# Patient Record
Sex: Male | Born: 1950 | Race: White | Hispanic: No
Health system: Western US, Academic
[De-identification: ages and names within clinical notes are randomized; demographics above are authoritative.]

## PROBLEM LIST (undated history)

## (undated) DIAGNOSIS — F32A Depression, unspecified: Secondary | ICD-10-CM

## (undated) DIAGNOSIS — I10 Essential (primary) hypertension: Secondary | ICD-10-CM

## (undated) DIAGNOSIS — J449 Chronic obstructive pulmonary disease, unspecified: Secondary | ICD-10-CM

## (undated) DIAGNOSIS — I272 Pulmonary hypertension, unspecified: Secondary | ICD-10-CM

## (undated) DIAGNOSIS — J45909 Unspecified asthma, uncomplicated: Secondary | ICD-10-CM

## (undated) DIAGNOSIS — J309 Allergic rhinitis, unspecified: Secondary | ICD-10-CM

## (undated) DIAGNOSIS — F419 Anxiety disorder, unspecified: Secondary | ICD-10-CM

## (undated) HISTORY — DX: Pulmonary hypertension, unspecified: I27.20

## (undated) HISTORY — DX: Anxiety disorder, unspecified: F41.9

## (undated) HISTORY — DX: Essential (primary) hypertension: I10

## (undated) HISTORY — PX: RT/LT HEART CATHETERS: 93526

## (undated) HISTORY — DX: Allergic rhinitis, unspecified: J30.9

## (undated) HISTORY — DX: Unspecified asthma, uncomplicated: J45.909

## (undated) HISTORY — DX: Depression, unspecified: F32.A

## (undated) HISTORY — DX: Chronic obstructive pulmonary disease, unspecified: J44.9

## (undated) MED ORDER — PANTOPRAZOLE SODIUM 40 MG OR TBEC
DELAYED_RELEASE_TABLET | ORAL | 11 refills | Status: AC
Start: 2019-08-29 — End: ?

## (undated) MED ORDER — FLUCONAZOLE 200 MG OR TABS
ORAL_TABLET | ORAL | 3 refills | Status: AC
Start: 2019-08-29 — End: ?

---

## 2008-10-26 ENCOUNTER — Other Ambulatory Visit (HOSPITAL_COMMUNITY): Payer: Self-pay

## 2008-10-26 LAB — PULMONARY FUNCTION TESTING
DL/VA-%Pred-Pre: 54
DL/VA-Pre: 1.94
DLCO-%Pred-Pre: 42
DLCO-Pre: 9.3
FEV1-%Pred-Pre: 32
FEV1-Pre: 1.09
FEV1/FVC-Pre: 33
FVC-%Pred-Pre: 77
FVC-Pre: 3.29
RV (Pleth)-%Pred-Pre: 156
RV (Pleth)-Pre: 3.53
SVC-%Pred-Pre: 103
SVC-Pre: 4.4
TLC (Pleth)-%Pred-Pre: 123
TLC (Pleth)-Pre: 7.93

## 2012-11-18 ENCOUNTER — Other Ambulatory Visit (HOSPITAL_COMMUNITY): Payer: Self-pay

## 2012-11-18 LAB — PULMONARY FUNCTION TESTING
DL/VA-%Pred-Pre: 45
DL/VA-Pre: 1.81
DLCO-%Pred-Pre: 25
DLCO-Pre: 6.9
FEV1-%Pred-Post: 25
FEV1-%Pred-Pre: 22
FEV1-Post: 0.85
FEV1-Pre: 0.76
FEV1/FVC-Post: 22
FEV1/FVC-Pre: 21
FVC-%Pred-Post: 84
FVC-%Pred-Pre: 79
FVC-Post: 3.8
FVC-Pre: 3.56
RV (Pleth)-%Pred-Pre: 159
RV (Pleth)-Pre: 3.86
SVC-%Pred-Pre: 79
SVC-Pre: 3.56
TLC (Pleth)-%Pred-Pre: 107
TLC (Pleth)-Pre: 7.36

## 2014-07-30 ENCOUNTER — Other Ambulatory Visit (HOSPITAL_COMMUNITY): Payer: Self-pay

## 2014-07-30 LAB — PULMONARY FUNCTION TESTING
DL/VA-%Pred-Pre: 41
DL/VA-Pre: 1.62
DLCO-%Pred-Pre: 26
DLCO-Pre: 7.1
FEV1-%Pred-Pre: 26
FEV1-Pre: 0.88
FEV1/FVC-Pre: 24
FVC-%Pred-Pre: 83
FVC-Pre: 3.71
RV (Pleth)-%Pred-Pre: 153
RV (Pleth)-Pre: 3.79
SVC-%Pred-Pre: 84
SVC-Pre: 3.76
TLC (Pleth)-%Pred-Pre: 109
TLC (Pleth)-Pre: 7.55

## 2014-10-30 ENCOUNTER — Encounter (HOSPITAL_BASED_OUTPATIENT_CLINIC_OR_DEPARTMENT_OTHER): Payer: Self-pay | Admitting: Internal Medicine

## 2015-02-26 ENCOUNTER — Other Ambulatory Visit (HOSPITAL_BASED_OUTPATIENT_CLINIC_OR_DEPARTMENT_OTHER): Payer: Self-pay | Admitting: Internal Medicine

## 2015-02-26 DIAGNOSIS — J439 Emphysema, unspecified: Secondary | ICD-10-CM

## 2015-03-26 ENCOUNTER — Other Ambulatory Visit (HOSPITAL_BASED_OUTPATIENT_CLINIC_OR_DEPARTMENT_OTHER): Payer: Self-pay | Admitting: Internal Medicine

## 2015-03-26 DIAGNOSIS — J439 Emphysema, unspecified: Secondary | ICD-10-CM

## 2015-04-03 ENCOUNTER — Ambulatory Visit (HOSPITAL_BASED_OUTPATIENT_CLINIC_OR_DEPARTMENT_OTHER): Payer: Medicare HMO

## 2015-04-03 ENCOUNTER — Ambulatory Visit (HOSPITAL_BASED_OUTPATIENT_CLINIC_OR_DEPARTMENT_OTHER): Payer: Medicare HMO | Admitting: Counselor

## 2015-04-03 ENCOUNTER — Ambulatory Visit (HOSPITAL_BASED_OUTPATIENT_CLINIC_OR_DEPARTMENT_OTHER): Payer: Medicare HMO | Admitting: Rehabilitative and Restorative Service Providers"

## 2015-04-03 ENCOUNTER — Ambulatory Visit: Payer: Medicare HMO | Attending: Internal Medicine | Admitting: Transplant

## 2015-04-03 VITALS — BP 132/83 | HR 92 | Temp 98.2°F | Ht 68.5 in | Wt 142.0 lb

## 2015-04-03 DIAGNOSIS — R911 Solitary pulmonary nodule: Secondary | ICD-10-CM | POA: Insufficient documentation

## 2015-04-03 DIAGNOSIS — K219 Gastro-esophageal reflux disease without esophagitis: Secondary | ICD-10-CM | POA: Insufficient documentation

## 2015-04-03 DIAGNOSIS — J439 Emphysema, unspecified: Secondary | ICD-10-CM | POA: Insufficient documentation

## 2015-04-03 DIAGNOSIS — J449 Chronic obstructive pulmonary disease, unspecified: Secondary | ICD-10-CM

## 2015-04-03 DIAGNOSIS — F32A Depression, unspecified: Secondary | ICD-10-CM | POA: Insufficient documentation

## 2015-04-03 DIAGNOSIS — Z01818 Encounter for other preprocedural examination: Secondary | ICD-10-CM | POA: Insufficient documentation

## 2015-04-03 DIAGNOSIS — J432 Centrilobular emphysema: Secondary | ICD-10-CM | POA: Insufficient documentation

## 2015-04-03 DIAGNOSIS — I10 Essential (primary) hypertension: Secondary | ICD-10-CM | POA: Insufficient documentation

## 2015-04-03 NOTE — Progress Notes (Addendum)
LUNG TRANSPLANT HISTORY AND PHYSICAL    CC: evaluation for lung transplant    HPI: [name] is a 64 year old male with a history of COPD (emphysema-type), GERD, HTN, Anxiety and recently diagnosed lung nodule who presents for evaluation for lung transplant.    Patient has had a relatively indolent course after being diagnosed with COPD in 24-Dec-2003.  At that time, he was placed on supplemental oxygen and quit smoking, however he started chewing tobacco.  He states that he remained quite active despite the need for supplemental oxygen, and was able to mow his lawn and hunt, which he does with his son and daughter.  Over the last 1-2 years, his functional capacity has diminished, where he is quite limited with his hunting, however he still does all his ADLs, albeit slower than before. With regards to his COPD, he notes one hospitalization roughly 8 years prior (2008-2009), however he has had no hospitalizations since then.  He does have a standing prescription for prednisone when he has flares, which he states he has had to use roughly 3 times per year.    He was noted on 08/13/14 to have a new LUL nodule that was concering for infection vs. Malignancy.  He subsequently had a PET-CT scan which showed FDG avidity in the LUL, and a repeat CT was recommended for interval follow up.  His interval CT in may again showed the LUL lesion with perhaps some thickening and possible air fluid level.    ROS:  Full review of systems was performed and negative except as noted in HPI    PMHx:  1. COPD-with centrilobular emphysema  2. Skin Cancer  3. GERD  4. HTN  5. Anxiety  6. Lung Nodule    Medications:  Current Outpatient Prescriptions   Medication Sig Dispense Refill   . Albuterol Sulfate (PROAIR HFA IN) Inhale 1 puff by mouth every 6 hours as needed.     . Budesonide-Formoterol Fumarate 160-4.5 MCG/ACT Inhalation Aerosol Inhale 2 puffs by mouth 2 times a day.     . Ibuprofen 200 MG Oral Cap Take 200 mg by mouth daily as needed.     .  Lisinopril-Hydrochlorothiazide 20-12.5 MG Oral Tab Take 1 tablet by mouth daily.     . Tiotropium Bromide Monohydrate 1.25 MCG/ACT Inhalation Aero Soln Inhale 1 puff by mouth 2 times a day.     . TraMADol HCl 50 MG Oral Tab Take 100 mg by mouth 2 times a day as needed.     . TraZODone HCl 50 MG Oral Tab Take 100 mg by mouth at bedtime.     . Venlafaxine HCl 100 MG Oral Tab Take 1 tablet by mouth 2 times a day.       No current facility-administered medications for this visit.       Social Hx:  Wife died in early 12/24/2014. 1.5PPD x 25 year smoking history, quit in 12/24/2003.  Chewing tobacco 2005-Dec 24, 2014   Physical Exam:  Physical Exam   Constitutional: He is oriented to person, place, and time and well-developed, well-nourished, and in no distress. No distress.   HENT:   Head: Normocephalic and atraumatic.   Eyes: EOM are normal. No scleral icterus.   Neck: No JVD present.   Pulmonary/Chest: Effort normal.   Musculoskeletal: He exhibits no edema.   Neurological: He is alert and oriented to person, place, and time. No cranial nerve deficit. GCS score is 15.   Skin: Skin is warm  and dry.   Psychiatric: Mood, memory, affect and judgment normal.     Filed Vitals:    04/03/15 0806   BP: 132/83   Pulse: 92   Temp: 98.2 F (36.8 C)   TempSrc: Temporal   Height: 5' 8.5" (1.74 m)   Weight: 141 lb 15.6 oz (64.4 kg)   SpO2: 100%     Body mass index is 21.27 kg/(m^2).      Imaging:  CT Chest: 08/13/14: Notable for masslike consolidation in LUL posterior aspect, indeterminate for chronic fibrosis vs. Neoplasm. LLL calcified granuloma.   CT Chest 12/10/14: New air fluid level in cavitary LLL nodule.  No increase in pleural/parenchymal thickening superiorly.  PET/CT 09/2014: Increased FDG avidity in subpleural left upper lobe nodule.  Calcified granuloma in left lung base.  Indeterminate for malignancy vs infection.    TTE1/4/16: EF 48%, mild global hypokinesis    Studies:   PFTs  07/30/14: FVC 3.71/83 FEV10.88/21 Ratio 0.24 DLCO7.7/28      EGD 12/05/14 for dysphagia: notable for Schatski's ring, dilated on that date  Colo 06/2006: hyperplastic polyps, no adenomatous changes      ASSESSMENT AND PLAN:  [name] is a 64 year old male with a history of COPD (emphysema-type), GERD, HTN, Anxiety and recently diagnosed lung nodule who presents for evaluation for lung transplant.  He meets criteria with regards to severity of illness, however there are a number of factors that must be considered/addressed.    1) LUL nodule concerning for infection vs. Malignancy.  Will f/u chest CT, and will need consideration for biopsy vs. Watchful waiting.  2) Depression and anxiety: especially given his wife's passing earlier this year.  Would consider outpatient psychiatric evaluation.  3) Abstinence from tobacco products: stopped chewing tobacco in April 2016.  Will need to abstain from all tobacco products for at least 6 months prior to listing.  4) Cardiac testing given his mildly depressed EF.  He will require LHC and RHC.    He has adequate support, including his daughter and his niece.          -------------------------------------------------------------------------------------------  Attending: Alphonsa Marsh  Date of service 04/03/15  I saw and evaluated the patient with this fellow.  I repeated the essential components of the history and exam.  I discussed the case with the fellow, and have reviewed the fellow's note. I agree with the findings and plan as documented in the fellow's note.  Key findings based upon my evaluation and discussion:     Meets criteria for transplant consideration given his progressive decrease in QOL, ~3 exacerbations per year, and FEV1 decline to ~20% predicted. Will initiate workup with attention to his LUL nodule with repeat CT today.    Dakota Overall, MD  Attending Physician  Division of Pulmonary and [facility]  -------------------------------------------------------------------------------------------

## 2015-04-03 NOTE — Progress Notes (Signed)
Respiratory Care Services  6 Minute Walk Preliminary Data Sheet         Gender: male   Age: 64 year old          Home Oxygen Therapy: YES   Flow Rate:  3  l/min        At nightYES     Supplemental Oxygen During the Test: NO    Flow Rate:   -   l/min  continuous flow NO    Walking Aid: NO    Description:   -     Position of probe (finger or forehead):  forehead     Pulse oximetry: Baseline:   98%   End of Test:   88%      Heart rate:  Baseline:   75   End of Test:   90    Borg Dyspnea: Baseline:   -   End of Test:   -    Borg Leg Fatigue: Baseline:   -   End of Test:   -    Desaturation to 88%: Time:   1 min. 40 sec.   Distance:   200 feet    Desaturation to 80%:  Time:   -   Distance:   - feet         Stopped / Paused Before the 6 Minutes:YES    Reason:   SpO2 decreased to 88%.    Distance Walked in 6 minutes   200   feet     Comments:     At rest in room air SpO2 = 98%, HR = 75. Patient walked 200 feet in 1 min.  40 sec. SpO2 decreased to 88%. Walk was stopped and supplemental oxygen was started at 2 LPM via a nasal cannula. SpO2 increased to 95 % at the end of the walk test. Patient walked a total of 540 feet in 6 minutes.

## 2015-04-04 ENCOUNTER — Other Ambulatory Visit (HOSPITAL_COMMUNITY): Payer: Self-pay

## 2015-04-04 ENCOUNTER — Other Ambulatory Visit (HOSPITAL_BASED_OUTPATIENT_CLINIC_OR_DEPARTMENT_OTHER): Payer: Self-pay | Admitting: Internal Medicine

## 2015-04-04 ENCOUNTER — Ambulatory Visit: Payer: Medicare HMO | Attending: Cardiovascular Disease

## 2015-04-04 ENCOUNTER — Ambulatory Visit (HOSPITAL_BASED_OUTPATIENT_CLINIC_OR_DEPARTMENT_OTHER): Payer: Medicare HMO | Admitting: Registered"

## 2015-04-04 VITALS — Ht 68.62 in | Wt 139.3 lb

## 2015-04-04 DIAGNOSIS — J439 Emphysema, unspecified: Secondary | ICD-10-CM

## 2015-04-04 DIAGNOSIS — J432 Centrilobular emphysema: Secondary | ICD-10-CM

## 2015-04-04 DIAGNOSIS — Z01818 Encounter for other preprocedural examination: Secondary | ICD-10-CM

## 2015-04-04 LAB — BLOOD GAS, VENOUS (NO ELECTROLYTES)
Base Excess, Blood, VEN: 7.9 meq/L — ABNORMAL HIGH (ref 0.0–3.0)
Bicarbonate, VEN: 35 meq/L — ABNORMAL HIGH (ref 23–27)
O2 Saturation, VEN: 31 % — ABNORMAL LOW (ref 70–75)
pCO2, VEN: 61 mmHg — ABNORMAL HIGH (ref 42–50)
pH, VEN: 7.36 (ref 7.32–7.40)
pO2, VEN: 21 mmHg — ABNORMAL LOW (ref 35–40)

## 2015-04-04 LAB — URINALYSIS COMPLETE, URN
Bacteria, URN: NONE SEEN
Bilirubin (Qual), URN: NEGATIVE
Epith Cells_Renal/Trans,URN: NEGATIVE /HPF
Epith Cells_Squamous, URN: NEGATIVE /LPF
Glucose Qual, URN: NEGATIVE mg/dL
Ketones, URN: NEGATIVE mg/dL
Leukocyte Esterase, URN: NEGATIVE
Nitrite, URN: NEGATIVE
Occult Blood, URN: NEGATIVE
Protein (Alb Semiquant), URN: NEGATIVE mg/dL
RBC, URN: NEGATIVE /HPF
Specific Gravity, URN: 1.01 g/mL (ref 1.002–1.027)
WBC, URN: NEGATIVE /HPF
pH, URN: 7 (ref 5.0–8.0)

## 2015-04-04 LAB — HLA TESTING (SENDOUT)

## 2015-04-04 NOTE — Progress Notes (Signed)
Date: 04/04/2015  Referred by: Dr. Janalyn Shy  Time spent with patient: 60 minutes  Reason for appointment: Lung Transplant Nutrition Assessment and Education  Interpreter used:  No    PI: Dakota Marsh is a 64 year old male with a history of COPD, emphysema-type seen in clinic today for transplant work up    Anthropometric Data:  Current wt: 63.2 kg 04/04/2015    Height: 174.3 cm-measured by RD 04/04/2015  Body mass index is 20.8 kg/(m^2).      Minimum weight for BMI of 18 kg/m2: 54.7 kg (120 lbs)      Usual body weight: 66.8 kg    % UBW: 95 %  Ideal body weight: 66-72 kg    Weight for calculations: 65 kg (adjusted)  Goal target weights: BMI of 22 kg/m2: 66.7 kg (147 lbs) BMI of 23 kg/m2: 69.7 kg (153 lbs)    Patient Interview/Subjective:    Weight history: Stable at 147 lbs until he took on roll of wife's caregiver 1.5 years ago.  Weight gradually declined and reached a low of 135 lbs while he was still using chewing tobacco.  Has gained slightly since stopping chewing tobacco  Anorexia: Improved appetite   Taste Changes: No   Early satiety/Dyspnea during/after eating: Yes, can't overeat though this issue has also improved since he quit chewing  GI symptoms: Constipation though improved now that he's eating balanced meals  Food allergies: None  Use of calorie/protein supplements: None  Chewing/Swallowing/Dental issues: No chewing or dental issues identified.  Reports upper GI and possible dilation (?) last April  Current diet: Regular, no dietary restrictions  Number of meals/snacks/day: 2 meals with snacks between.  Cooks for himself  Diet history:   Coffee in the AM  Sandwich (lunch meat, mayo, 2 slices of bread), potato chips, coffee for lunch  Hamb[employer] Hel[employer] or spaghetti with sauce are typical entrees, vegetables, salad, slice of bread for evening meal  Snacks consist of potato chips, cheese with crackers, salami or popcorn  Fluid intake: Whole milk, 1/2 caffenated, "weak" coffee ~50 ounces/day.  Used to drink 3  pots of regular coffee daily.  No water, no juice, no soda  Alcohol intake: None  Smoking history: Quit in 2005 and stopped chewing tobacco  Recreational drug use: None  Social history: Widower and lives alone  Activities of daily living: Housework, cooking, yard work/gardening  Recreational Activity: Does outdoor activities such as hunting but has significantly modified pace 2/2 decreased functional status and changes in respiratory status  Participation in Pulmonary Rehab: No lives 40 minutes away from town in [place]  Vitamin, mineral and herbal supplements use: Has an MVI but doesn't take  Glucose monitoring: NA   Estimation of Hgb A1C: NA    Clinical Data:  Diagnosis and medical history:  COPD-with centrilobular emphysema  2. Skin Cancer  3. GERD  4. HTN  5. Anxiety  6. Lung Nodule  Current Outpatient Prescriptions   Medication Sig Dispense Refill   . Albuterol Sulfate (PROAIR HFA IN) Inhale 1 puff by mouth every 6 hours as needed.     . Budesonide-Formoterol Fumarate 160-4.5 MCG/ACT Inhalation Aerosol Inhale 2 puffs by mouth 2 times a day.     . Ibuprofen 200 MG Oral Cap Take 200 mg by mouth daily as needed.     . Lisinopril-Hydrochlorothiazide 20-12.5 MG Oral Tab Take 1 tablet by mouth daily.     . Tiotropium Bromide Monohydrate 1.25 MCG/ACT Inhalation Aero Soln Inhale 1 puff by mouth 2  times a day.     . TraMADol HCl 50 MG Oral Tab Take 100 mg by mouth 2 times a day as needed.     . TraZODone HCl 50 MG Oral Tab Take 100 mg by mouth at bedtime.     . Venlafaxine HCl 100 MG Oral Tab Take 1 tablet by mouth 2 times a day.       No current facility-administered medications for this visit.   Labs: Na 139 K+ 3.7 Creat .72 GFR > 60 Glu 96 Ca 9.7 Mg 2.1 Phos 2.2 A1C 5.5 Alb 4.4 Hct 42 Iron 136 MCHC 31.8  T Chol 231 TG 80 LDL 132 HDL 83 Non HDL 148 Vitamin D 30.6  04/04/2015    Nutrient Requirements: 2400-2600 kcal (for wt gain), 80-85 gm protein (60-65 gm coming from HBV sources), 1000 mg calcium, 1000 IU vitamin D,  2000 ml fluid (30 ml/kg)    Nutrition Assessment:  Evaluation of intake: Roughly estimate pt consuming ~ 1800-2000 kcal daily with ~40-50 gm of high quality protein daily meeting 75% of calorie needs and ~65-70 % of protein needs for weight gain of lean body mass/anabolism.  Dietary intake of calcium ~ 400-650 mg/day based on history provided  Ability to meet nutrient requirements: Yes with dietary adjustments   Barriers: Early satiety, need for preparing meals/snacks on his own and cooking for one  Evaluation of nutrition status: Moderate nutritional risk 2/2 disease state and co morbidities.  Remains functionally stable despite increased oxygen needs and emotional toll of the loss of his wife after being her primary caregiver.  Visually appears mildly depleted in lean body mass and is currently ~ 8 pounds below his usual/target weight.  Labs reflect slightly depleted phosphorus and mild hyperlipidemia though increased HDL contributing to T Chol elevation.  MCHC also slightly below normal though RBC and Hct on target  Demonstrated understanding, willingness and ability to improve nutritional status: Yes    Education Provided:  Meeting Nutrient Requirements-focus on calories, protein, calcium and vitamin D  Use of Oral Supplements-samples of Boost VHC provided  Tips for Weight Gain  Use of vitamin and mineral supplements to meet daily needs  Written materials resources provided: Yes      Demonstrated understanding of education: Yes    Barriers: None  Education was provided taking into consideration the patient's learning abilities, readiness to learn and cultural beliefs    Goals of nutrition therapy:  Anabolism; Adequate intake of all nutrients, including calories, protein, calcium, vitamin D and phosphorus    Plan:  Review of nutritional status with transplant team at selection conference when appropriate   Needs follow up: Yes    RD contact information provided to patient for follow up     Asencion Noble,  [place]  Registered Dietitian  [phone]

## 2015-04-05 ENCOUNTER — Encounter (HOSPITAL_BASED_OUTPATIENT_CLINIC_OR_DEPARTMENT_OTHER): Payer: Self-pay | Admitting: Transplant

## 2015-04-05 LAB — XX DO NOT USE XX PRE-LUNG HEME/COAG PANEL
D_Dimer, Quant: <0.27 ug{FEU}/mL (ref 0.00–0.59)
Erythrocyte Sedimentation Rate: 14 mm/h (ref 0–15)
Fibrinogen: 437 mg/dL (ref 150–450)
Hematocrit: 42 % (ref 38–50)
Hemoglobin A1C: 5.5 % (ref 4.0–6.0)
Hemoglobin: 13.4 g/dL (ref 13.0–18.0)
MCH: 29 pg (ref 27.3–33.6)
MCHC: 31.8 g/dL — ABNORMAL LOW (ref 32.2–36.5)
MCV: 91 fL (ref 81–98)
Partial Thromboplastin Time: 25 s (ref 22–35)
Platelet Count: 271 10*3/uL (ref 150–400)
Prothrombin INR: 1 (ref 0.8–1.3)
Prothrombin Time Patient: 12.8 s (ref 10.7–15.6)
RBC: 4.62 10*6/uL (ref 4.40–5.60)
RDW-CV: 12.5 % (ref 11.6–14.4)
Thrombin Time: 16 s (ref 16–25)
WBC: 5.81 10*3/uL (ref 4.3–10.0)

## 2015-04-05 NOTE — Addendum Note (Signed)
Addended byAlphonsa Overall on: 04/05/2015 05:25 PM     Modules accepted: Level of Service

## 2015-04-07 LAB — NICOTINE AND METABOLITES
Cotinine (Sendout): <2 ng/mL (ref <2.0)
Nicotine (Sendout): <2 ng/mL (ref <2.0)

## 2015-04-08 LAB — XX DO NOT USE PRE-LUNG EXTENDED PANEL
ALT (GPT): 14 U/L (ref 10–48)
AST (GOT): 16 U/L (ref 9–38)
Albumin: 4.4 g/dL (ref 3.5–5.2)
Alkaline Phosphatase (Total): 61 U/L (ref 37–159)
Amylase Total: 57 U/L (ref 27–106)
Ana Interpretation Comment 1: NEGATIVE
Ana Screen By Multiplex: NEGATIVE
Anion Gap: 8 (ref 4–12)
Antibodies to Nuclear Ags by IF (ANA): NEGATIVE
Bilirubin (Total): 0.4 mg/dL (ref 0.2–1.3)
Bone Alkaline Phosphatase: 12 ug/L (ref 0–20)
Calcium: 9.7 mg/dL (ref 8.9–10.2)
Carbon Dioxide, Total: 31 meq/L (ref 22–32)
Chloride: 100 meq/L (ref 98–108)
Cholesterol (LDL): 132 mg/dL — ABNORMAL HIGH (ref <130)
Cholesterol/HDL Ratio: 2.8
Creatinine: 0.72 mg/dL (ref 0.51–1.18)
Ferritin: 183 ng/mL (ref 20–230)
GFR, Calc, African American: >60 mL/min (ref >59)
GFR, Calc, European American: >60 mL/min (ref >59)
Glucose: 96 mg/dL (ref 62–125)
HDL Cholesterol: 83 mg/dL (ref >39)
Immunoglobulin A: 287 mg/dL (ref 84–499)
Immunoglobulin E, Total: 84 [IU]/mL (ref 0–214)
Immunoglobulin G: 912 mg/dL (ref 610–1616)
Immunoglobulin M: 26 mg/dL — ABNORMAL LOW (ref 40–350)
Iron, SRM: 136 ug/dL (ref 31–171)
Lactate Dehydrogenase: 178 U/L (ref <210)
Magnesium: 2.1 mg/dL (ref 1.8–2.4)
Non-HDL Cholesterol: 148 mg/dL (ref 0–159)
Parathyroid Hormone: 45 pg/mL (ref 12–88)
Phosphate: 2.2 mg/dL — ABNORMAL LOW (ref 2.5–4.5)
Potassium: 3.7 meq/L (ref 3.6–5.2)
Protein (Total): 7.4 g/dL (ref 6.0–8.2)
Rheumatoid Factor: <10 [IU]/mL (ref <15)
Sodium: 139 meq/L (ref 135–145)
Thyroid Stimulating Hormone: 1.086 u[IU]/mL (ref 0.400–5.000)
Thyroxine (T4): 9 ug/dL (ref 4.8–10.8)
Total Cholesterol: 231 mg/dL — ABNORMAL HIGH (ref <200)
Total Iron Binding Capacity: 414 ug/dL (ref 250–460)
Transferrin Saturation: 33 % (ref 15–50)
Transferrin: 296 mg/dL (ref 180–329)
Triglyceride: 80 mg/dL (ref <150)
Triiodothyronine (T3): 150 ng/dL (ref 73–178)
Urea Nitrogen: 15 mg/dL (ref 8–21)
Uric Acid: 5.2 mg/dL (ref 3.9–7.6)
Vit D (25_Hydroxy) Total: 30.6 ng/mL (ref 20.1–50.0)
Vitamin D2 (25_Hydroxy): <1 ng/mL
Vitamin D3 (25_Hydroxy): 30.6 ng/mL

## 2015-04-08 LAB — BWNW HLA CLASS I, II MOL TYPE

## 2015-04-08 LAB — ABO, RH (SENDOUT)

## 2015-04-08 LAB — HLA AB SPECIFICITY (SENDOUT)

## 2015-04-10 LAB — LUNG TRX WORKUP SEROLOGY PANEL
Anti Toxoplasma, IgG Interp: NONREACTIVE
Anti Toxoplasma, IgG Result: <3.2 [IU]/mL (ref 0.0–7.4)
Anti Toxoplasma, IgM Interp: NONREACTIVE
CMV Immune Status Result: POSITIVE
EBV IgG Antibody: POSITIVE
EBV IgM Antibody: NEGATIVE
EBV Nuclear Antibody: POSITIVE
HIV Antigen and Antibody Interpretation: NONREACTIVE
HIV Antigen and Antibody Result: NONREACTIVE
HSV1 WB Result: POSITIVE
HSV2 WB Result: NEGATIVE
Hepatitis A IgG Antibody: NONREACTIVE
Hepatitis A IgM antibody: NONREACTIVE
Hepatitis B Core Ab: NONREACTIVE
Hepatitis B Surf Antibody Intl Units: <8 [IU]
Hepatitis B Surface Ab: NONREACTIVE
Hepatitis B Surface Antigen w/Reflex: NONREACTIVE
Hepatitis C Antibody w/Rflx PCR: NONREACTIVE
Mumps Immune Status Result: POSITIVE
Quantiferon TB Ag Result: 0.133 IU gamma IF/mL
Quantiferon TB MIT Result: >10 IU gamma IF/mL
Quantiferon TB Nil Result: 0.133 IU gamma IF/mL
Rubella Antibody Level: >20 [IU]
Rubella Immune Status Result: POSITIVE
Rubeola Immune Status Result: POSITIVE
Syphilis Igg Ab Result: NEGATIVE
Syphilis Screening Status: NEGATIVE
Varicella Zoster Immune Status Result: >1:8 {titer}

## 2015-05-05 NOTE — Progress Notes (Signed)
PSYCHOSOCIAL ASSESSMENT: LUNG TRANSPLANT  IDENTIFYING DATA:  Dakota Marsh is a 64 year-old male diagnosed with a COPD who met with social work as part of the evaluation process for possible lung transplantation. The patient was accompanied by his daughter, Earley Brooke, and his niece, Doran Stabler.     SOCIAL HISTORY:  Mr. Erdahl was born and raised [place]. He lived and worked in [place] for a period of time before returning to [place]. He currently lives in his own home in [place], [place], a small community 40 miles from [place].    The patient is a widower. He has four adult children. Tonya lives in [place], [place].  He has a son in [place] and another son in [place].  He did not provide the name of his other son who is currently in prison. The patient has two adult grandchildren.   Mr. Housman has identified his daughter and niece as his primary care providers. Both are available to provide support as long as required.    Mr. Wherley understands he is required to live in the [place] area for a minimum of 3-6 months post-transplant. At this point his plan is to stay in his niece's home in [place], [place].    The patient will need to have a flight plan in place prior to being listed for transplant.  He will inquire with his insurance to see if air transport is part of his benefits.     EDUCATION/EMPLOYMENT/INCOME:  Mr. Dowding worked in [employer] for most of his adult life. He retired due to his disability in 2005. He has [employer].   Mr. Zollner has not completed a Durable Power of Attorney for [place] or [employer] Direct[employer] and was given appropriate forms to complete and return to [facility] social worker as soon as possible.      EXPECTATIONS OF TRANSPLANT:  Mr. Ulbricht expresses that he is very motivated to pursue transplant. He is eager to return to the activities he enjoys especially fishing and hunting.  He is quite limited due to his oxygen requirements but is able  to complete ADL's independently.        Mr. Fuelling has a good understanding of the medication compliance issue, and a willingness to adhere to a post-transplant medical follow-up schedule of numerous appointments particularly in the first months following the surgery.    PSYCHOSOCIAL ASSESSMENT:  Mr. Torpey presented as a very pleasant, friendly man who appeared to be his stated age. His affect was unremarkable and he appeared alert and oriented throughout the interview.      Mr. Staiger is prescribed Effexor for anxiety. He is not enrolled in outpatient mental health therapy and has never been hospitalized for psychiatric reasons.  Mr. Cimo smoked 1.5 PPD for 25 years. He stopped smoking in 2005 then began chewing tobacco. He stopped in April of this year.  He denies current use of other nicotine products such as chewing tobacco or Nicorette gum.  The patient rarely consumes alcohol. He has never used recreational drugs.   IMPRESSIONS AND RECOMMENDATIONS:  Mr. Coluccio presents as a pleasant, cooperative individual with a reasonable understanding the risks and benefits associated with transplantation. He seems to have adequate caregiver pursue listing.  He is encouraged to consider fund raising as a way to offset the costs associated with transplant. He was provided contact information for [employer], the organization which assists transplant patients with fund raising endeavors.     Assuming he is able to complete  a care and housing plan and arrange for air transport, Social Work recommends that Mr. Lowenthal be listed for lung transplant.

## 2015-05-23 ENCOUNTER — Other Ambulatory Visit (HOSPITAL_BASED_OUTPATIENT_CLINIC_OR_DEPARTMENT_OTHER): Payer: Self-pay | Admitting: Internal Medicine

## 2015-05-23 DIAGNOSIS — J439 Emphysema, unspecified: Secondary | ICD-10-CM

## 2015-06-07 ENCOUNTER — Other Ambulatory Visit: Payer: Self-pay | Admitting: Internal Medicine

## 2015-06-07 ENCOUNTER — Other Ambulatory Visit (HOSPITAL_BASED_OUTPATIENT_CLINIC_OR_DEPARTMENT_OTHER): Payer: Self-pay | Admitting: Internal Medicine

## 2015-06-07 ENCOUNTER — Telehealth (HOSPITAL_BASED_OUTPATIENT_CLINIC_OR_DEPARTMENT_OTHER): Payer: Self-pay | Admitting: Surgery

## 2015-06-07 DIAGNOSIS — J439 Emphysema, unspecified: Secondary | ICD-10-CM

## 2015-06-07 NOTE — Telephone Encounter (Signed)
Orders

## 2015-06-20 ENCOUNTER — Other Ambulatory Visit (HOSPITAL_BASED_OUTPATIENT_CLINIC_OR_DEPARTMENT_OTHER): Payer: Self-pay | Admitting: Transplant

## 2015-06-20 ENCOUNTER — Other Ambulatory Visit (HOSPITAL_BASED_OUTPATIENT_CLINIC_OR_DEPARTMENT_OTHER): Payer: Self-pay | Admitting: Internal Medicine

## 2015-06-20 DIAGNOSIS — J439 Emphysema, unspecified: Secondary | ICD-10-CM

## 2015-06-20 DIAGNOSIS — J449 Chronic obstructive pulmonary disease, unspecified: Secondary | ICD-10-CM

## 2015-06-25 ENCOUNTER — Ambulatory Visit (HOSPITAL_BASED_OUTPATIENT_CLINIC_OR_DEPARTMENT_OTHER): Payer: Medicare HMO | Admitting: Registered"

## 2015-06-25 ENCOUNTER — Other Ambulatory Visit (HOSPITAL_BASED_OUTPATIENT_CLINIC_OR_DEPARTMENT_OTHER): Payer: Self-pay | Admitting: Internal Medicine

## 2015-06-25 ENCOUNTER — Ambulatory Visit: Payer: Medicare HMO | Attending: Internal Medicine | Admitting: Surgery

## 2015-06-25 ENCOUNTER — Ambulatory Visit (HOSPITAL_BASED_OUTPATIENT_CLINIC_OR_DEPARTMENT_OTHER): Payer: Medicare HMO

## 2015-06-25 VITALS — Ht 68.62 in | Wt 140.7 lb

## 2015-06-25 DIAGNOSIS — J432 Centrilobular emphysema: Secondary | ICD-10-CM | POA: Insufficient documentation

## 2015-06-25 DIAGNOSIS — J439 Emphysema, unspecified: Secondary | ICD-10-CM | POA: Insufficient documentation

## 2015-06-25 DIAGNOSIS — Z23 Encounter for immunization: Secondary | ICD-10-CM | POA: Insufficient documentation

## 2015-06-25 DIAGNOSIS — J449 Chronic obstructive pulmonary disease, unspecified: Secondary | ICD-10-CM

## 2015-06-25 NOTE — Progress Notes (Signed)
Vaccine Screening Questions: [facility]        1. I feel sick today.   NO    1. There is no evidence that acute illness reduces vaccine efficacy or increases vaccine adverse events. However, with moderate or severe illness, all vaccines should be delayed until the illness has improved. Mild illness (e.g., upperrespiratory infection or diarrhea) are NOT contraindications to vaccination. Do not withhold vaccination if a person is taking antibiotic.    2. Are you allergic to eggs, baker's yeast, gelatin, streptomycin, neomycin, formaldehyde,merthiolate / thimerosal? (Please circle all that apply)  NO    2. Please see Vaccine Excipient Table below at end of form.**    3. Are you currently taking "blood thinning medications" such as Warfarin (Coumadin), Enoxaparin (Lovenox, Dalteparin(Fragmin), Fondiparinux(Arixtra), Aspirin, Clopidogrel(Plavix), Prasugrel(Effient), Dipyridamol(Persantine), Ticagrelor(Brilinta), Cilostazol(Pletal), Aggrenox, Rivaroxaban(Xarelto), Dabigatran(Xarelto)  NO    3.  Patients who are currently receiving these medications need to advise their providers so that we may take specific precautions when giving intramuscular vaccines. Administer using a 23-gauge (or smaller) needle and apply pressure to  the vaccine site for 2 minutes after administration. DO NOT RUB the vaccine site. Patients should be counseled on monitoring for signs and symptoms of hematoma formation at the site of the injection.    4. Do you have allergies to medications, food, or any vaccine?   If yes, please list:@ALLERGIES@  NO    4. History of anaphylactic reaction such as hives, wheezing or difficulty breathing, or circulatory collapse or shock (not fainting) from a previous dose of vaccine or vaccine component is a contraindication for further doses. For example, if a person  experiences anaphylaxis after eating eggs, do not administer influenza vaccine, or if a person has anaphylaxis after eating  gelatin, do not administer MMR or varicella vaccine. Local reactions (e.g., a red eye following instillation of ophthalmic solution) are not contraindications. Please see Vaccine Excipient Table enclosed on order form.     5. Have you ever had a severe reaction to any vaccine? If yes, please describe:  NO    5.  History of anaphylactic reaction to a previous dose of vaccine or vaccine component is a contraindication for subsequent  doses. Under normal circumstances, vaccines are deferred when a precaution is present. However, situations may arise  when the benefit outweighs the risk (e.g., community measles outbreak).    6. Are you currently undergoing radiation treatments?  NO    6.  If patient is undergoing radiation therapy, inactivated vaccine is no problem. For live virus vaccines contact patient's PCP prior to administration.    7. Have you had Immune Globulin (Gamma Globulin) or received blood or blood products in the past seven months?  NO    7. Live virus vaccines may need to be deferred, depending on several variables. Please see the ACIP Statement "General Recommendations on Immunization" on page 1 of the PINK BOOK and APPENDIX A for suggested intervals between administration of blood products and live virus vaccines.    8. Have you ever had Guillian-Barre Syndrome, a condition that causes paralysis?  NO    8. If the patient has had this condition, hold the vaccination and call the MD.    9.  Are you pregnant or planning a pregnancy in the next three months?  NO    9. Live virus vaccines are contraindicated prior to and during pregnancy due to the possible risk of virus transmission to the fetus. Instruct patients who are sexually active   in their childbearing years to practice careful contraception for one month  after receiving the MMR, rubella, varicella, or intranasal influenza vaccination.    10. Have you received any vaccinations in the past 4 weeks?  Yes: pneumonia, zoster    10. You may either  administer two live vaccines on the same day OR separate them by 28 days. Live vaccines must replicate in order for the immune system to mount a response. Separating live vaccine administrations allows for less interference of  viral replication. On the other hand, inactivated vaccines may be given together or at any spacing interval.    11. Are you allergic to latex products? (If yes, please describe reaction):  NO    11. If the patient has a true latex allergy, call the inpatient pharmacy ([phone]) and they will provide a latex-free immunization.          Adapted from "Understanding the Screening Questionnaire for Adult Immunization", Immunization [employer] Coalition, www.immunize.org.   Updated June 2014    **VACCINE EXCIPIENT AND MEDIA SUMMARY  Excipient Use Vaccine    Egg protein: Influenza (all brands)  Formaldehyde, formalin:  DTaP (all brands), Td (all brands), Hepatitis A (Havrix, Vaqta), Hepatitis A-hepatitis B (Twinrix), Influenza (Fluogen, FluShield, Fluzone)  Gelatin: Influenza (Fluzone), Measles (Attenuvax), Mumps (Mumpsvax),  Rubella (Meruvax II), MMR (MMRII), Typhoid oral (Vivotif), Varicella (Varivax)  Neomycin:  Measles (Attenuvax), Mumps (Mumpsvax), Rubella (Meruvax II),  MMR (MMR-II), Rabies (Imovax), Influenza (Fluvirin)  Phenol: Pneumococcal polysaccharide (Pneumovax 23)  Streptomycin: Influenza (Fluogen)  Thimerosal: Influenza (all brands except the intranasal vaccine), Pneumococcal  polysaccharide (Pneu-Immune 23), Meningococcal (Menomune)  Yeast: Hepatitis B (Engerix-B, Recombivax-HB)    Adapted from "Vaccine Excipient & Media Summary", ImmunoFacts, Facts and Comparisons,  February 2001 and Offit PA, Jew RK. Addressing parents' concerns: do vaccines contain harmful  preservatives, adjuvants, additives, or residuals? Pediatrics.2003;112(6);1394-7.  Updated June 2014

## 2015-06-25 NOTE — Progress Notes (Signed)
[facility] Manometry Procedure  Patient in clinic today for a manometry procedure. Patient identification verified.  Procedure reviewed and consent signed. Patient reports that they have been fasting for six hours.      Referred by: Dr. Dian Situ  Chief complaint: Shortness of breath, cough.  COPD    Patient intubated:  [x]  Right nares  []  Left nares  []  orally  []  other     Tolerated procedure: _  [x] Well with minimal difficulty.    [x] O2 sat 91-97% throughout procedure.    [x] O2 at 4L via nasal prongs during procedure.   [x] 2% viscous lidocaine applied to both nare and back of throat prior to start of procedure. Patient states no allergy to lidocaine.  [x] Anticoagulant therapy: none    D/C Instructions reviewed:  [x]  If you can swallow comfortably, you may resume your usual diet.   [x]  You may have a sore throat for a couple of hours  [x]  We would like you to notify the clinic if you develop any of the following:        a. Temperature over 99.5 F or chills        b.  Unusual chest pain or abdominal pain        c.  Any unusual difficulty swallowing or moving your neck        d.  Any intestinal bleeding symptoms (black or red stools)    Office Contact Information and after hour information provided.    Other: Placement pH wire following manometry procedure.   Patient to follow-up with referring physician, Dr. Dian Situ.    Rhoderick Moody, RN

## 2015-06-25 NOTE — Progress Notes (Signed)
24 hr pH Catheter placement     Patient in clinic  today for a 24hr pH catheter placement.  Patient identification verified.  Patient has been fasting for 6 hours.  Referred by: Dr. Dian Situ  Chief complaint: Shortness of breath, cough.  COPD  Allergy to tape: none     Patient reports that the  following medications have been stopped:  []  PPI's for   days  [] H2 blockers for   hours  [] Antacids since  midnight  [x]  Not on any  medication       Type of Catheter  placed:  [x] PHN 10  [] ZAI-BL-56  [] ZPI-BL-54  []  ZAN-BS-01      Catheter placed into right Nares at  41  cm.  Secured to right side of face with tegaderm and catheter run over the top of the right ear and secured to right neck.     Tolerated procedure:   [x] Catheter passed smoothly, no issues  [x] Placement pH wire following manometry procedure and location of proximal LES.  [x] Recorder PM# [id number],  PH wire lot# [id number], expiration 05/10/2016, card# 2       Instruction  reviewed:  [x] Avoid getting the unit  wet   [x] Do not remove the catheter or disconnect from the unit  [x] Do not take antacids (Tums, Mylanta, or  prescription antacids)  [x] Do not eat saltine  crackers  [x] Avoid eating the following: peanut butter, sticky rice, hot cheese, pie crust, syrup, honey, taffy, macaroni and cheese, and carmel..  [x]  If you drink carbonated beverages or citrus  drinks, wine, gatorade, or crystal lite to drink within a 10 minute time period  [x] Diary reviewed in regards to how to fill it  in.  [x] Eat 3 meals today  [x]  Press the supine button when you are planning  to sleep at night or for a nap     Follow-up:  Patient scheduled to return tomorrow for catheter removal.   Patient to follow-up with referring physician, Dr. Dian Situ.       Rhoderick Moody, RN

## 2015-06-25 NOTE — Progress Notes (Signed)
Date: 06/25/2015  Referred by: Dr. Waymond Cera  Time spent with patient: 45 minutes  Reason for appointment: Lung Transplant Nutrition Assessment and Education-Reevaluation  Interpreter used:  No    PI:  Dakota Marsh is a 64 year old male with a history of COPD, emphysema-type seen in clinic today for transplant work up    Anthropometric Data:  Current wt: 63.8 kg 06/25/2015    Height: 174.3 cm-measured by RD 04/04/2015  Body mass index is 21 kg/(m^2).      Minimum weight for BMI of 18 kg/m2: 54.7 kg    Goal target weights: BMI of 22 kg/m2: 66.7 kg    BMI of 23 kg/m2: 69.7 kg  Weight history: 63.2 kg 04/04/2015    Usual body weight: 66.8 kg      Ideal body weight: 66-72 kg    Weight for calculations: 65 kg   Start boost plus  receiving 600 mg of calcium     Patient Interview/Subjective:    Weight history: Stable at home  Anorexia-No  Appetite is improving and is now becoming hungry because he's eating more snacks during the day    Early satiety: Yes    Dyspnea after eating: Yes-occurs after eating  GI symptoms: Constipation on occasion  Food allergies: None  Use of calorie/protein supplements: Yes liked samples provided at last clinic visit but hasn't purchased.  Wiling to start drinking for a breakfast alternative and before bed  Chewing/Swallowing/Dental issues: No  Current diet: Regular  Number of meals and/or snacks/day: 2 meals, 1-2 snacks  Diet History:  Drinks black coffee for breakfast only  Whole lunch meat and cheese with mayo sandwich for lunch with 16 ounces of whole milk  Snacks in the afternoon consist of cheese with crackers, nuts, potato chips and home made jerkey  Cooked protein (sausage, steak, burgers, chicken) with vegetables for dinner  Middle of the night sandwich when he's hungry (lunch meat/cheese)  Fluid intake: Coffee, water, 16 ounces of whole milk   Energy level: Good-feels well and highly active on his property in [place] of daily living: Continues to live independently caring for  himself, his home and his property  Recreational Activity: Has been hunting this fall  Participation in Pulmonary Rehab: Has not attended as he lives 40 miles away and he's functionally stable at home.  May consider if he moves to [facility] with daughter or will use her exercise equipment at her home  Vitamin, mineral and herbal supplements use: [employer] vitamins-gummy, Vitamin D3-?, vitamin B complex.  No calcium    Clinical Data:  Diagnosis and medical history:  COPD-with centrilobular emphysema  2. Skin Cancer  3. GERD  4. HTN  5. Anxiety  6. Lung Nodule  Current Outpatient Prescriptions   Medication Sig Dispense Refill   . Albuterol Sulfate (PROAIR HFA IN) Inhale 1 puff by mouth every 6 hours as needed.     . Budesonide-Formoterol Fumarate 160-4.5 MCG/ACT Inhalation Aerosol Inhale 2 puffs by mouth 2 times a day.     . Ibuprofen 200 MG Oral Cap Take 200 mg by mouth daily as needed.     . Lisinopril-Hydrochlorothiazide 20-12.5 MG Oral Tab Take 1 tablet by mouth daily.     . Tiotropium Bromide Monohydrate 1.25 MCG/ACT Inhalation Aero Soln Inhale 1 puff by mouth 2 times a day.     . TraMADol HCl 50 MG Oral Tab Take 100 mg by mouth 2 times a day as needed.     Marland Kitchen  TraZODone HCl 50 MG Oral Tab Take 100 mg by mouth at bedtime.     . Venlafaxine HCl 100 MG Oral Tab Take 1 tablet by mouth 2 times a day.       No current facility-administered medications for this visit.   Labs: Not checked  Nutrient Requirements: 2500+-kcal, 80-85 gm protein (60-65 gm coming from HBV sources), 1000 mg calcium, 1000 IU vitamin D    Nutrition Assessment:  Evaluation of intake: Meets baseline needs for anabolism though not weight gain and daily calcium intake 600 mg meeting 60 % of target.  Adding 1-2 Boost + day will provide additional 360 to 720 kcal with 300-600 mg additional calcium  Ability to meet nutrient requirements: Yes with dietary adjustments such as adding Boost + for breakfast  Evaluation of nutrition status: Low to  moderate nutritional risk with great functional status.  Appears improved since last seen with stable weight though will benefit from additional nutrient intake to promote weight gain of additional muscle mass.  Labs not checked though vitamin D was wnl with last blood draw  Demonstrated understanding, willingness and ability to improve nutritional status: Yes    Education Provided:  Meeting Nutrient Requirements-calcium.  Continue 16 ounces of milk and start Boost + 1-2/day  Use of Oral Supplements-Start Boost + 1-2/day, every AM for breakfast and HS as desired  Alternate lower sodium, higher calorie/protein lunch ideas  Use of vitamin and mineral supplements to meet daily needs-call RD with information regarding amount of vitamin D in supplement  Alternate ideas for exercise if he moves to [facility] to live with his daughter  Written materials resources provided: Yes      Demonstrated understanding of education: Yes    Barriers: None  Education was provided taking into consideration the patient's learning abilities, readiness to learn and cultural beliefs    Goals of nutrition therapy:  Anabolism/weight gain of lean body mass; Adequate intake of all nutrients, including calories, protein, calcium, vitamin D and phosphorus    Plan:  Review of nutritional status with transplant team at selection conference when appropriate   Needs follow up: Yes    RD contact information provided to patient, daughter, brother and sister in law for follow up     Asencion Noble, [place]  Registered Dietitian  [phone]

## 2015-06-26 ENCOUNTER — Other Ambulatory Visit (HOSPITAL_BASED_OUTPATIENT_CLINIC_OR_DEPARTMENT_OTHER): Payer: Medicare Other

## 2015-06-26 ENCOUNTER — Other Ambulatory Visit (HOSPITAL_COMMUNITY): Payer: Self-pay

## 2015-06-26 ENCOUNTER — Ambulatory Visit: Payer: Medicare HMO | Attending: Internal Medicine | Admitting: Surgery

## 2015-06-26 ENCOUNTER — Ambulatory Visit (HOSPITAL_BASED_OUTPATIENT_CLINIC_OR_DEPARTMENT_OTHER): Payer: Medicare HMO

## 2015-06-26 DIAGNOSIS — R911 Solitary pulmonary nodule: Secondary | ICD-10-CM

## 2015-06-26 DIAGNOSIS — J439 Emphysema, unspecified: Secondary | ICD-10-CM

## 2015-06-26 DIAGNOSIS — Z01811 Encounter for preprocedural respiratory examination: Secondary | ICD-10-CM | POA: Insufficient documentation

## 2015-06-26 LAB — PULMONARY FUNCTION TESTING
DL/VA-%Pred-Pre: 33
DL/VA-Pre: 1.61
DLCO-%Pred-Pre: 21
DLCO-Pre: 6.7
FEV1-%Pred-Pre: 25
FEV1-Pre: 0.84
FEV1/FVC-Pre: 27
FVC-%Pred-Pre: 72
FVC-Pre: 3.14
RV (Pleth)-%Pred-Pre: 182
RV (Pleth)-Pre: 4.09
SVC-%Pred-Pre: 78
SVC-Pre: 3.39
TLC (Pleth)-%Pred-Pre: 113
TLC (Pleth)-Pre: 7.47

## 2015-06-26 NOTE — Progress Notes (Signed)
High Resolution Manometry Study Report    PROCEDURE  High Resolution Manometry    Date of Study:06/25/2015  Date of Interpretation: 06/26/2015     ATTENDING  Herbie Baltimore B. Lorin Mercy, MD    REFERRING PROVIDER  Lease    INDICATIONS FOR PROCEDURE  Shortness of Breath, cough    TECHNIQUE  Informed consent was obtained after discussing the risks and benefits of the procedure with the patient. The nares were anesthetized using viscous lidocaine and a catheter was introduced into the stomach. The lower esophageal sphincter, the esophageal body, the upper esophageal sphincter, and the transit of a liquid (saline) bolus through the esophagus were analyzed using a solid state high-resolution manometry and impedance catheter that combines 32 pressure transducers with six pairs of impedance sensors (centered at 3, 5, 7, 10, 15, and 20 cm above the LES).     This high-resolution manometry study was interpreted using the [employer].    FINDINGS    Initial Impression   1. The lower esophageal sphincter is located at 48 cm. The resting pressure is low and sphincter relaxation is normal.    2. There is not evidence of a dual high-pressure zone.      3. The esophageal body is characterized by 100 percent normal peristalsis      4. The upper esophageal sphincter is located at 20.8 cm. The resting pressure is normal.    5. Additional manometric findings according to the [place] include:    a. Integral Relaxation Pressure: 1.5 mm Hg  b.  Distal Contractile Integral: 2797 mm Hg/cm/s  c.  Distal Latency: 6.4 sec    Final Impression  Hypotensive lower esophageal sphincter, normal peristalsis        Swallow Composite (mean of 10 swallows) Resting Pressure Profile & Anatomy       Basal Pressures*  LES, respiratory mean(mmHg) 7.1 (13-43)  UES mean(mmHg) 45.9 (34-104)    Anatomy*  LES proximal(cm) 48.0  LES intraabdominal(cm) 0.6  Esophageal length(cm) 28.0  Hiatal hernia No       Motility*  Dist. wave amplitude(mmHg) 146.5  (43-152)  Wave dur. @ LES -3.0 & 7.0(s) 3.7 (2.7-5.4)  Onset vel. (LES -11.0 to -3.0)(cm/s) 4.3 (2.8-6.3)  Percent peristaltic(%) 90  Percent simultaneous(%) 10 (?10%)  Percent failed(%) 0 (0%)  Distal contr. integral(mmHg-cm-s) 2797.2 ([phone])  Incomplete bolus clearance(%) 0   Residual Pressures*  LES (mean)(mmHg) 1.5 (<15.0)  UES (mean)(mmHg) 1.3 (<12.0)   *Notes. Motility values are mean among swallows; Normal values in (xxx.x):  Simultaneous contractions: Velocity > 8.0 cm/s; eSlv: eSleeve; 3SN, IRP, DCI, IBP - See manual definitions      Lower Esophageal Sphincter Region  Normal Esophageal Motility  Normal   Landmarks   Number of swallows evaluated 10        Proximal LES (from nares)(cm) 48.0  Evaluated @ 3.0 - 11.0 above LES         LES length(cm) 2.0 2.7-4.8     Peristaltic (velocity ? 6.25 cm/s)(%) 90        Esophageal length (LES-UES centers)(cm) 28.0      Simultaneous (vel. ? 6.25 cm/s)(%) 10 ?10%       Intraabdominal LES length(cm) 0.6      Failed(%) 0 0%       Hiatal hernia? No  Evaluated @ 3.0 & 7.0 above LES     LES Pressures       Mean wave amplitude(mmHg) 146.5 43-152       Pressure meas.  method eSleeve,IRP      Mean wave duration(s) 3.7 2.7-5.4       Basal (respiratory min.)(mmHg) -11.3 4.8-32.0     Double-peaked waves(%) 0 ?15%       Basal (respiratory mean)(mmHg) 7.1 13-43     Triple-peaked waves(%) 0 0%       Residual (mean)(mmHg) 1.5 <15.0 Velocity (11.0-3.0 above LES)(cm/s) 4.3 2.8-6.3      High Resolution Parameters            Distal contractile integral(mean)(mmHg-cm-s) 2797.2 [phone]          Contractile front velocity(cm/s) 3.2 <9.0          Intrabolus pressure (@LESR )(mmHg) -1.2 <8.4          Intrabolus pressure (avg max)(mmHg) 12.7 <17.0      Chicago Classification            Distal latency 6.4           % failed (Chicago Classification) 0           % panesophageal pressurization 0           % premature contraction 10           % rapid contraction 0           % large breaks 0            % small breaks 0       Impedance analysis            Incomplete bolus clearance(%) 0           Bolus transit time (sec) 5.1            Upper Esophageal Sphincter  Normal Pharyngeal / UES Motility  Normal   Mean basal pressure(mmHg) 45.9 34-104 No. swallows evaluated 10    Mean residual pressure(mmHg) 1.3 <12.0 Evaluated @ 3.0 & N/A above UES            Mean peak pressure(mmHg) 9.7               Landmark Id             Swallow #1               Swallow #2             Swallow #3                   Swallow #4             Swallow #5               Swallow #6             Swallow #7                 Swallow #8             Swallow #9               Swallow #10

## 2015-06-26 NOTE — Progress Notes (Signed)
Patient returns today for removal of pH probe:     Probe position and active recording verified.  Probe removed intact without resistance.  Patient tolerated procedure well.    Diary reviewed with patient.  Patient to follow-up with referring physician, Erika Lease, MD.

## 2015-06-27 ENCOUNTER — Ambulatory Visit (HOSPITAL_BASED_OUTPATIENT_CLINIC_OR_DEPARTMENT_OTHER): Payer: Medicare Other | Admitting: Counselor

## 2015-06-28 ENCOUNTER — Ambulatory Visit (HOSPITAL_BASED_OUTPATIENT_CLINIC_OR_DEPARTMENT_OTHER): Payer: Medicare HMO | Admitting: Interventional Cardiology

## 2015-06-28 ENCOUNTER — Ambulatory Visit (HOSPITAL_BASED_OUTPATIENT_CLINIC_OR_DEPARTMENT_OTHER): Payer: Medicare HMO

## 2015-06-28 ENCOUNTER — Ambulatory Visit
Admission: RE | Admit: 2015-06-28 | Discharge: 2015-06-28 | Disposition: A | Discharge status: Home / Self Care | Payer: Medicare HMO | Attending: Interventional Cardiology | Admitting: Interventional Cardiology

## 2015-06-28 DIAGNOSIS — Z01810 Encounter for preprocedural cardiovascular examination: Secondary | ICD-10-CM

## 2015-06-28 DIAGNOSIS — J432 Centrilobular emphysema: Secondary | ICD-10-CM | POA: Insufficient documentation

## 2015-06-28 DIAGNOSIS — J449 Chronic obstructive pulmonary disease, unspecified: Secondary | ICD-10-CM

## 2015-06-28 DIAGNOSIS — I2510 Atherosclerotic heart disease of native coronary artery without angina pectoris: Secondary | ICD-10-CM | POA: Insufficient documentation

## 2015-06-28 LAB — BASIC METABOLIC PANEL
Anion Gap: 9 (ref 4–12)
Calcium: 9.6 mg/dL (ref 8.9–10.2)
Carbon Dioxide, Total: 30 meq/L (ref 22–32)
Chloride: 102 meq/L (ref 98–108)
Creatinine: 0.75 mg/dL (ref 0.51–1.18)
GFR, Calc, African American: >60 mL/min (ref >59)
GFR, Calc, European American: >60 mL/min (ref >59)
Glucose: 95 mg/dL (ref 62–125)
Potassium: 4.1 meq/L (ref 3.6–5.2)
Sodium: 141 meq/L (ref 135–145)
Urea Nitrogen: 19 mg/dL (ref 8–21)

## 2015-06-28 LAB — CBC, DIFF
% Basophils: 1 %
% Eosinophils: 2 %
% Immature Granulocytes: 0 %
% Lymphocytes: 26 %
% Monocytes: 10 %
% Neutrophils: 61 %
% Nucleated RBC: 0 %
Absolute Eosinophil Count: 0.14 10*3/uL (ref 0.00–0.50)
Absolute Lymphocyte Count: 1.82 10*3/uL (ref 1.00–4.80)
Basophils: 0.05 10*3/uL (ref 0.00–0.20)
Hematocrit: 40 % (ref 38–50)
Hemoglobin: 12.6 g/dL — ABNORMAL LOW (ref 13.0–18.0)
Immature Granulocytes: 0.02 10*3/uL (ref 0.00–0.05)
MCH: 28.7 pg (ref 27.3–33.6)
MCHC: 31.9 g/dL — ABNORMAL LOW (ref 32.2–36.5)
MCV: 90 fL (ref 81–98)
Monocytes: 0.68 10*3/uL (ref 0.00–0.80)
Neutrophils: 4.2 10*3/uL (ref 1.80–7.00)
Nucleated RBC: 0 10*3/uL
Platelet Count: 317 10*3/uL (ref 150–400)
RBC: 4.39 10*6/uL — ABNORMAL LOW (ref 4.40–5.60)
RDW-CV: 12.6 % (ref 11.6–14.4)
WBC: 6.91 10*3/uL (ref 4.3–10.0)

## 2015-06-28 LAB — PROTHROMBIN & PTT
Partial Thromboplastin Time: 25 s (ref 22–35)
Prothrombin INR: 0.9 (ref 0.8–1.3)
Prothrombin Time Patient: 12.2 s (ref 10.7–15.6)

## 2015-06-28 NOTE — Progress Notes (Signed)
Dual probe pH Study Report    PROCEDURE  Dual probe 24-hr pH monitoring    Date of Study: 06/26/2015  Date of Interpretation: 06/28/2015     ATTENDING  Harolyn Rutherford, MD    REFERRING PROVIDER  Lease    INDICATIONS FOR PROCEDURE  Shortness of breath, cough    MEDICATIONS  The patient was not on medications.    TECHNIQUE  Informed consent was obtained after discussing the risk and benefits of the procedure with the patient. The nares were anesthetized using viscous lidocaine, and a dual-channel pH catheter was placed transnasally into the esophagus.   The patient was given a symptom diary and the information was acquired with the [place] and analyzed with the [place].     FINDINGS  Distal channel placed 41 cm from the nares and 6 cm from the manometrically determined proximal border of the lower esophageal sphincter.    Duration of the test: 22 hours, 46 minutes    PH study:  Acid Exposure (pH)     Upright Normal Recumbent  Normal Total Normal  Channel 1  Time 3.2 min  3.9 min  7.1 min   Percent Time 0.4%  0.6%  0.5%   Mean Acid Clearance Time 27 sec  26 sec  27 sec   Number of Episodes 7  9  16    Longest Episode 1.6 min  1.5 min  1.6 min    Clearance pH  :  Channel 2  Time 9.6 min  9.5 min  19.1 min   Percent Time 1.3% (< 6.3 %) 1.5% (< 1.2 %) 1.4% (< 4.2 %)  Mean Acid Clearance Time 34 sec  190 sec  57 sec   Number of Episodes 17  3  20    Longest Episode 2.8 min  9.3 min  9.3 min        De Meester score: 7.0    Symptom Correlation:     None    Fellow's Initial Impression   Normal pH study    Waveform

## 2015-07-01 ENCOUNTER — Ambulatory Visit (HOSPITAL_BASED_OUTPATIENT_CLINIC_OR_DEPARTMENT_OTHER): Payer: Medicare HMO | Admitting: Thoracic Surgery (Cardiothoracic Vascular Surgery)

## 2015-07-01 ENCOUNTER — Encounter (HOSPITAL_BASED_OUTPATIENT_CLINIC_OR_DEPARTMENT_OTHER): Payer: Self-pay | Admitting: Transplant

## 2015-07-01 ENCOUNTER — Ambulatory Visit (HOSPITAL_BASED_OUTPATIENT_CLINIC_OR_DEPARTMENT_OTHER): Payer: Medicare HMO | Admitting: Rehabilitative and Restorative Service Providers"

## 2015-07-01 ENCOUNTER — Ambulatory Visit (HOSPITAL_BASED_OUTPATIENT_CLINIC_OR_DEPARTMENT_OTHER): Payer: Medicare Other

## 2015-07-01 ENCOUNTER — Encounter (HOSPITAL_BASED_OUTPATIENT_CLINIC_OR_DEPARTMENT_OTHER): Payer: Self-pay | Admitting: Thoracic Surgery (Cardiothoracic Vascular Surgery)

## 2015-07-01 ENCOUNTER — Other Ambulatory Visit (HOSPITAL_BASED_OUTPATIENT_CLINIC_OR_DEPARTMENT_OTHER): Payer: Self-pay | Admitting: Internal Medicine

## 2015-07-01 ENCOUNTER — Ambulatory Visit (HOSPITAL_BASED_OUTPATIENT_CLINIC_OR_DEPARTMENT_OTHER): Payer: Medicare HMO

## 2015-07-01 ENCOUNTER — Ambulatory Visit: Payer: Medicare HMO | Attending: Internal Medicine | Admitting: Transplant

## 2015-07-01 VITALS — BP 123/76 | HR 76 | Temp 97.7°F | Ht 68.5 in | Wt 145.0 lb

## 2015-07-01 VITALS — BP 138/92 | HR 96 | Temp 96.8°F | Ht 68.62 in | Wt 144.2 lb

## 2015-07-01 DIAGNOSIS — J438 Other emphysema: Secondary | ICD-10-CM | POA: Insufficient documentation

## 2015-07-01 DIAGNOSIS — J439 Emphysema, unspecified: Secondary | ICD-10-CM

## 2015-07-01 DIAGNOSIS — Z01818 Encounter for other preprocedural examination: Secondary | ICD-10-CM | POA: Insufficient documentation

## 2015-07-01 LAB — BLOOD GAS, VENOUS (NO ELECTROLYTES)
Base Excess, Blood, VEN: 8 meq/L — ABNORMAL HIGH (ref 0.0–3.0)
Bicarbonate, VEN: 34 meq/L — ABNORMAL HIGH (ref 23–27)
O2 Saturation, VEN: 35 % — ABNORMAL LOW (ref 70–75)
pCO2, VEN: 60 mmHg — ABNORMAL HIGH (ref 42–50)
pH, VEN: 7.37 (ref 7.32–7.40)
pO2, VEN: 22 mmHg — ABNORMAL LOW (ref 35–40)

## 2015-07-01 LAB — BASIC METABOLIC PANEL
Anion Gap: 7 (ref 4–12)
Calcium: 9.6 mg/dL (ref 8.9–10.2)
Carbon Dioxide, Total: 34 meq/L — ABNORMAL HIGH (ref 22–32)
Chloride: 98 meq/L (ref 98–108)
Creatinine: 0.73 mg/dL (ref 0.51–1.18)
GFR, Calc, African American: >60 mL/min (ref >59)
GFR, Calc, European American: >60 mL/min (ref >59)
Glucose: 94 mg/dL (ref 62–125)
Potassium: 3.9 meq/L (ref 3.6–5.2)
Sodium: 139 meq/L (ref 135–145)
Urea Nitrogen: 14 mg/dL (ref 8–21)

## 2015-07-01 LAB — NICOTINE AND COTININE, URN
Cotinine (Qual), URN: NEGATIVE
Nicotine, URN: NEGATIVE

## 2015-07-01 LAB — XX DO NOT USE XX LPRE-LUNG MALE WORKUP
PSA, Diagnostic/Monitoring: 0.65 ng/mL (ref 0.00–4.00)
Testosterone: 3.2 ng/mL (ref 1.6–5.9)

## 2015-07-01 NOTE — Progress Notes (Signed)
Respiratory Care Services  6 Minute Walk Preliminary Data Sheet         Gender: male   Age: 64 year old          Home Oxygen Therapy: YES   Flow Rate:  4  l/min        At nightYES     Supplemental Oxygen During the Test: YES    Flow Rate:   2   l/min  continuous flow YES    Walking Aid: NO    Description:   -     Position of probe (finger or forehead):  forehead     Pulse oximetry: Baseline:   98%   End of Test:   98%      Heart rate:  Baseline:   100   End of Test:   100    Borg Dyspnea: Baseline:   -   End of Test:   -    Borg Leg Fatigue: Baseline:   -   End of Test:   -    Desaturation to 88%: Time:   1 min. 20 sec.   Distance:   255 feet    Desaturation to 80%:  Time:   -   Distance:   - feet         Stopped / Paused Before the 6 Minutes:YES    Reason:   x3 for increased shortness of breath.    Distance Walked in 6 minutes   455   feet     Comments:     At rest on supplemental oxygen at 4 LPM via nasal cannula, SpO2 = 100%, HR = 90. Decreased oxygen to 2 LPM for the walk test. SpO2 = 97%. Patient walked 255 feet in 1 min.  20 sec. SpO2 decreased to 88%, increased oxygen to 4 LPM. SpO2 increased to 98% at the end of the walk test. Patient walked a total of 455 feet in 6 minutes.

## 2015-07-01 NOTE — Progress Notes (Signed)
Attending Attestation    I personally reviewed and interpreted this 24-hour dual probe pH monitoring report.    Diagnosis: Normal pH study.

## 2015-07-01 NOTE — Progress Notes (Signed)
See dictation

## 2015-07-01 NOTE — Progress Notes (Signed)
LUNG TRANSPLANT CLINIC    CC: pre-transplant evaluation    HPI: [name] is a 64 year old man with emphysema, GERD, HTN, Anxiety presenting for pre-transplant follow up. Please see initial note from 8/24 for full details. Briefly, diagnosed with COPD 12-10-03, and has been on supplemental O2 since about that time. Has experienced progressive symptoms over past 1-2 years with decreased functional capacity, and ~3 outpatient exacerbations per year. Ultimately referred for LTx evaluation due to this and an FEV1 ~20% predicted.    Since the last visit, symptoms are fairly stable. Did have one exacerbation in early October treated with an outpatient steroid burst. Continues to have significant SOB with any distance up inclines, and distances > 100-200 ft on flat ground. Using 4L O2 at all times, no CPAP or BIPAP. Independent with ADLs, but needs to push a cart while shopping at [facility], and prefers baths to showers due to the steam having a tendency to increase work of breathing. Mild chronic cough is present, no sputum or hemoptysis.    Otherwise, denies f/c/ns, weight loss, GERD, dysphagia, n/v/abd pain, LE edema.    A complete ROS was otherwise negative or as per hpi     ADLs:  [ x] No assistance needed - is or would be able to live independently   Please describe: Still shopping independently  [ ]  Some assistance needed - unable to perform iADLs (e.g. preparing meals, shopping, transportation) but able to perform basic ADLs (e.g. bathing, dressing, personal hygiene)   Please describe:  [ ]  Total assistance needed - needs assistance with both iADLs and basic ADLs   Please describe:    PMHx:  1. COPD/emphysema  2. LUL pulmonary nodule - first diagnosed 08/13/14, PET avid, stable on follow up  3. History of multiple skin cancers, follows regularly with a dermatologist  4. 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 CAD   - 06/28/15 LHC revealed 50% RCA  6. Anxiety/depression  7.  Colon polyps - due for repeat ~04/2018  8. HTN  9. Osteopenia - T score -2.0 (Oct 2016)      Current outpatient prescriptions:   .  Albuterol Sulfate (PROAIR HFA IN), Inhale 1 puff by mouth every 6 hours as needed., Disp: , Rfl:   .  Budesonide-Formoterol Fumarate 160-4.5 MCG/ACT Inhalation Aerosol, Inhale 2 puffs by mouth 2 times a day., Disp: , Rfl:   .  Ibuprofen 200 MG Oral Cap, Take 200 mg by mouth daily as needed., Disp: , Rfl:   .  Lisinopril-Hydrochlorothiazide 20-12.5 MG Oral Tab, Take 1 tablet by mouth daily., Disp: , Rfl:   .  Tiotropium Bromide Monohydrate 1.25 MCG/ACT Inhalation Aero Soln, Inhale 1 puff by mouth 2 times a day., Disp: , Rfl:   .  TraMADol HCl 50 MG Oral Tab, Take 100 mg by mouth 2 times a day as needed., Disp: , Rfl:   .  TraZODone HCl 50 MG Oral Tab, Take 100 mg by mouth at bedtime., Disp: , Rfl:   .  Venlafaxine HCl 100 MG Oral Tab, Take 1 tablet by mouth 2 times a day., Disp: , Rfl:   Review of patient's allergies indicates:  Allergies   Allergen Reactions   . Codeine      ANXIETY/DYSPNEA     Social Hx:  Lives outside of [place]. Wife died in early 2014-12-10. 1.5PPD x 25 year smoking history, quit in 12/10/2003.  Was chewing tobacco 2005-April 2016, but has since stopped.    Physical Exam  PHYSICAL EXAM  Filed Vitals:    07/01/15 0809   BP: 138/92   Pulse: 96   Temp: 96.8 F (36 C)   TempSrc: Temporal   Height: 5' 8.62" (1.743 m)   Weight: 144 lb 2.9 oz (65.4 kg)   SpO2: 92%     Body mass index is 21.53 kg/(m^2).  General: thin, comfortable appearing, appears stated age, NAD  HEENT: PERRL, moist mucous membranes, no mucosal lesions  Neck: Without LAD, no thyromegaly  Lungs: Chest is normal to appearance, symmetric expansion but distant BS, tympanic to percussion. No wheezes, crackles, or rhonci.  Heart: RRR, Nl S1/S2, without murmurs, gallops, rubs  Abdomen: Nondistended, +BS, soft, nontender, no guarding/rebound, no hepatosplenomegaly  Extremities: WWP, no clubbing or cyanosis, no edema  Skin: No  rashes or lesions  Neuro: Strength normal and symmetric  Psych: Normal affect, conversant    STUDIES:    CT chest 06/25/15  Unchanged left upper lobe fluid-filled cavitary nodule compared to April 03, 2015. As before, infectious process is most likely.  Unchanged severe diffuse centrilobular emphysema with bibasilar atelectasis.  Unchanged left pleural thickening.    CT Chest: 08/13/14: Notable for masslike consolidation in LUL posterior aspect, indeterminate for chronic fibrosis vs. Neoplasm. LLL calcified granuloma.   CT Chest 12/10/14: New air fluid level in cavitary LLL nodule.  No increase in pleural/parenchymal thickening superiorly.  PET/CT 09/2014: Increased FDG avidity in subpleural left upper lobe nodule.  Calcified granuloma in left lung base.  Indeterminate for malignancy vs infection.    TTE 03/2015  Normal EF, mild PAH    RHC 11/18  RA 2  PA 29/19/23  PACWP 9  CI 2.6    PFTs:   06/26/15  FVC   3.14/72  FEV1   0.84/25  R   0.27  2575   0.28/9  TLC   7.47/113  RV   4.09/182  DLCO   6.7/21    Labs   All were reviewed, most notably -   VBG 7.36/61/21  Cr 0.8  Vit D 31    ASSESSMENT AND PLAN: 64 yo man with advanced emphysema here for follow up. He has completed the majority of the pre-transplant evaluation. Meets criteria to proceed based on progressive decrease in QOL, ~3 exacerbations per year, FEV1 20-25% predicted, and hypercapnia.      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

## 2015-07-01 NOTE — Progress Notes (Signed)
REVIEW OF SYSTEMS:  Review of Systems includes the following responses to our health assessment questionnaire.  Any complications with prior surgery? No  Have you ever had anesthesia problems? No  Family history of anesthesia problems? No    ANY CURRENT PROBLEMS WITH YOUR HEALTH? ADDITIONAL INFORMATION   GENERAL Recent Weight Gain/Loss  Fatigue/Trouble Sleeping  Fever/Chills/Night Sweats   No  No  YES   Current Ht & Wt:  Ht 5' 8.504" (1.74 m)  Wt 145 lb (65.772 kg)  Body mass index is 21.72 kg/(m^2).   EAR/NOSE/MOUTH/THROAT Hearing Loss/Hearing Aid  Ear Problems  Nose Problems  Mouth or Throat Problems  Nose bleeds/Sinus Problems  Dental Problems/Dentures  Loose or Missing Tooth/Teeth YES  No  No  No  No  YES  YES    EYE Wear glasses/contacts  Eye Problems  Yellowing of white part of eyes YES  No  No    NEUROLOGY Problems with vision  Headaches/Dizziness  Seizures  Fainting/Unconsciousness  Numbness/Tingling/Weakness No  No  No  No  No    HEART Chest Pain  Heart Murmur  High Blood Pressure  Recent Heart Attack/MI  Artificial Heart Valve(s)  Able to walk two flights of stairs No  No  YES  No  No  No    LUNG Shortness of breath (day or night)  Asthma  Sleep Apnea/Snoring  Difficulty sleeping  Lung problems  Recent cold or cough YES  YES  YES  No  YES  No    SKIN Masses/Bumps/Lumps  Rashes  Lesions/Cuts/Scrapes  Wounds/Blisters No  No  No  No    STOMACH/  GASTROINTESINAL/COLON/RECTUM Stomach/Abdominal Pain  Hiatal hernia  Heartburn/Indigestion  Nausea/Vomiting  Diarrhea  Constipation  Blood in Stool  Jaundice/Yellowing of skin  Hepatitis  No  No  No  No  No  YES  No  No  No                 Type:    MUSCLES/BONES       Joint Pain  Back Pain/Disc Disease  Sprain/Strain  Stiffness/Arthritis  Artificial joint(s)  Other physical disability No  YES  No  No  No  No Location:           Type:    URINARY TRACT        Male/Male Issues  REPRODUCTION   Urinary Problems  Pain with urination  Kidney Problems/Kidney  Stones    Male/Male Specific Problems  Females-Could you be pregnant? No  No  No      No      BLOOD/LYMPH Bleeding Problems  Anemia  Swollen or enlarged glands No  No  No    IMMUNOLOGICAL Hay Fever  Allergies  HIV/Aids No  YES  No    ENDOCRINE Heat/Cold Intolerance  Hyperthyroid/Hypothyroid  Increased thirst/Diabetes YES  No  No    MENTAL HEALTH Anxiety/Depression  Psychiatric Care  Other Concerns YES  No  No

## 2015-07-01 NOTE — Progress Notes (Signed)
Attending Attestation    I personally reviewed and interpreted this high-resolution esophageal manometry study.    Diagnosis: Hypotensive lower esophageal sphincter, otherwise normal esophageal manometry.

## 2015-07-02 ENCOUNTER — Ambulatory Visit: Payer: Medicare HMO | Attending: Internal Medicine

## 2015-07-02 DIAGNOSIS — J439 Emphysema, unspecified: Secondary | ICD-10-CM

## 2015-07-02 LAB — HLA AB DETECTION (SENDOUT)

## 2015-07-02 NOTE — Progress Notes (Signed)
[place], CLUM          [id number]          07/01/2015          CLINIC NOTE     Mr. Sayarath is a 64 year old former smoker who has advanced emphysema and is being considered for lung transplantation.  His FEV1 and DLCO are both approaching 20% of predicted and he has diffuse disease and apical pleural scarring on the left.  Therefore, he is not an LVRS candidate.  He also has a pCO2 of 61, which would be exclusionary.  He has significant coronary calcifications but has no significant disease in his left-sided circulation, but his right coronary has a distal focal 50% stenosis that is not felt to be flow-limiting.  His PA pressures are 29/19 and he has a scar-like lesion in his left upper lobe with some associated pleural retraction and cavitation.  This was not observed on serial scans and is felt to be infectious or inflammatory.  I would, therefore, think that the left has to be transplanted if we were to do a single, but I would prefer to do a bilateral transplant given his degree of hyperinflation (his residual volume is 182% of predicted).  He is well supported by his daughter, brother and sister-in-law.  He will be relocating to [facility] for his pretransplant wait and live in [place] for 3 months posttransplant before attempting to return to his home in [place], [place], which is near [place].  I do not see any hard stops or absolute contraindications to transplant, the remainder of his evaluation seems to be reasonable, and I look forward to discussing his case at the next appropriate multidisciplinary lung transplant candidate committee.    Time spent face-to-face with the patient was 30 minutes, the majority of which was spent in counseling and care coordination related to his emphysema and possible need for transplant, as discussed above.

## 2015-07-03 LAB — ABO, RH (SENDOUT)

## 2015-07-10 ENCOUNTER — Encounter (HOSPITAL_BASED_OUTPATIENT_CLINIC_OR_DEPARTMENT_OTHER): Payer: Self-pay

## 2015-07-26 ENCOUNTER — Encounter (HOSPITAL_BASED_OUTPATIENT_CLINIC_OR_DEPARTMENT_OTHER): Payer: Self-pay

## 2015-07-29 ENCOUNTER — Encounter (HOSPITAL_BASED_OUTPATIENT_CLINIC_OR_DEPARTMENT_OTHER): Payer: Self-pay | Admitting: Cardiovascular Disease

## 2015-07-29 ENCOUNTER — Ambulatory Visit (HOSPITAL_BASED_OUTPATIENT_CLINIC_OR_DEPARTMENT_OTHER): Payer: Medicare HMO

## 2015-07-29 ENCOUNTER — Ambulatory Visit: Payer: Medicare HMO | Attending: Transplant | Admitting: Cardiovascular Disease

## 2015-07-29 VITALS — BP 109/65 | HR 89 | Temp 97.9°F | Ht 68.5 in | Wt 147.0 lb

## 2015-07-29 DIAGNOSIS — Z23 Encounter for immunization: Secondary | ICD-10-CM | POA: Insufficient documentation

## 2015-07-29 DIAGNOSIS — J449 Chronic obstructive pulmonary disease, unspecified: Secondary | ICD-10-CM

## 2015-07-29 DIAGNOSIS — Z01818 Encounter for other preprocedural examination: Secondary | ICD-10-CM

## 2015-07-29 DIAGNOSIS — I10 Essential (primary) hypertension: Secondary | ICD-10-CM | POA: Insufficient documentation

## 2015-07-29 MED ORDER — ATORVASTATIN CALCIUM 40 MG OR TABS
40.0000 mg | ORAL_TABLET | Freq: Every day | ORAL | Status: DC
Start: 2015-07-29 — End: 2015-09-20

## 2015-07-29 MED ORDER — ATORVASTATIN CALCIUM 40 MG OR TABS
40.0000 mg | ORAL_TABLET | Freq: Every day | ORAL | Status: DC
Start: 2015-07-29 — End: 2015-07-29

## 2015-07-29 NOTE — Progress Notes (Signed)
Vaccine Screening Questions: [facility]        1. I feel sick today.   NO    1. There is no evidence that acute illness reduces vaccine efficacy or increases vaccine adverse events. However, with moderate or severe illness, all vaccines should be delayed until the illness has improved. Mild illness (e.g., upperrespiratory infection or diarrhea) are NOT contraindications to vaccination. Do not withhold vaccination if a person is taking antibiotic.    2. Are you allergic to eggs, baker's yeast, gelatin, streptomycin, neomycin, formaldehyde,merthiolate / thimerosal? (Please circle all that apply)  NO    2. Please see Vaccine Excipient Table below at end of form.**    3. Are you currently taking "blood thinning medications" such as Warfarin (Coumadin), Enoxaparin (Lovenox, Dalteparin(Fragmin), Fondiparinux(Arixtra), Aspirin, Clopidogrel(Plavix), Prasugrel(Effient), Dipyridamol(Persantine), Ticagrelor(Brilinta), Cilostazol(Pletal), Aggrenox, Rivaroxaban(Xarelto), Dabigatran(Xarelto)  NO    3.  Patients who are currently receiving these medications need to advise their providers so that we may take specific precautions when giving intramuscular vaccines. Administer using a 23-gauge (or smaller) needle and apply pressure to  the vaccine site for 2 minutes after administration. DO NOT RUB the vaccine site. Patients should be counseled on monitoring for signs and symptoms of hematoma formation at the site of the injection.    4. Do you have allergies to medications, food, or any vaccine?   If yes, please list:_0 @  Yes: codeine    4. History of anaphylactic reaction such as hives, wheezing or difficulty breathing, or circulatory collapse or shock (not fainting) from a previous dose of vaccine or vaccine component is a contraindication for further doses. For example, if a person  experiences anaphylaxis after eating eggs, do not administer influenza vaccine, or if a person has anaphylaxis  after eating gelatin, do not administer MMR or varicella vaccine. Local reactions (e.g., a red eye following instillation of ophthalmic solution) are not contraindications. Please see Vaccine Excipient Table enclosed on order form.     5. Have you ever had a severe reaction to any vaccine? If yes, please describe:  NO    5.  History of anaphylactic reaction to a previous dose of vaccine or vaccine component is a contraindication for subsequent  doses. Under normal circumstances, vaccines are deferred when a precaution is present. However, situations may arise  when the benefit outweighs the risk (e.g., community measles outbreak).    6. Are you currently undergoing radiation treatments?  NO    6.  If patient is undergoing radiation therapy, inactivated vaccine is no problem. For live virus vaccines contact patient's PCP prior to administration.    7. Have you had Immune Globulin (Gamma Globulin) or received blood or blood products in the past seven months?  NO    7. Live virus vaccines may need to be deferred, depending on several variables. Please see the ACIP Statement "General Recommendations on Immunization" on page 1 of the [place] and APPENDIX A for suggested intervals between administration of blood products and live virus vaccines.    8. Have you ever had Guillian-Barre Syndrome, a condition that causes paralysis?  NO    8. If the patient has had this condition, hold the vaccination and call the MD.    9.  Are you pregnant or planning a pregnancy in the next three months?  NO    9. Live virus vaccines are contraindicated prior to and during pregnancy due to the possible risk of virus transmission to the fetus. Instruct patients who are sexually  active in their childbearing years to practice careful contraception for one month  after receiving the MMR, rubella, varicella, or intranasal influenza vaccination.    10. Have you received any vaccinations in the past 4 weeks?  NO    10. You may either administer  two live vaccines on the same day OR separate them by 28 days. Live vaccines must replicate in order for the immune system to mount a response. Separating live vaccine administrations allows for less interference of  viral replication. On the other hand, inactivated vaccines may be given together or at any spacing interval.    11. Are you allergic to latex products? (If yes, please describe reaction):  NO    11. If the patient has a true latex allergy, call the inpatient pharmacy ([phone]) and they will provide a latex-free immunization.          Adapted from "Understanding the Screening Questionnaire for Adult Immunization", Immunization [employer] Coalition, www.immunize.org.   Updated June 2014    **VACCINE EXCIPIENT AND MEDIA SUMMARY  Excipient Use Vaccine    Egg protein: Influenza (all brands)  Formaldehyde, formalin:  DTaP (all brands), Td (all brands), Hepatitis A (Havrix, Vaqta), Hepatitis A-hepatitis B (Twinrix), Influenza (Fluogen, FluShield, Fluzone)  Gelatin: Influenza (Fluzone), Measles (Attenuvax), Mumps (Mumpsvax),  Rubella (Meruvax II), MMR (MMRII), Typhoid oral (Vivotif), Varicella (Varivax)  Neomycin:  Measles (Attenuvax), Mumps (Mumpsvax), Rubella (Meruvax II),  MMR (MMR-II), Rabies (Imovax), Influenza (Fluvirin)  Phenol: Pneumococcal polysaccharide (Pneumovax 23)  Streptomycin: Influenza (Fluogen)  Thimerosal: Influenza (all brands except the intranasal vaccine), Pneumococcal  polysaccharide (Pneu-Immune 23), Meningococcal (Menomune)  Yeast: Hepatitis B (Engerix-B, Recombivax-HB)    Adapted from "Vaccine Excipient & Media Summary", ImmunoFacts, Facts and Comparisons,  February 2001 and Offit PA, Jew RK. Addressing parents' concerns: do vaccines contain harmful  preservatives, adjuvants, additives, or residuals? Pediatrics.2003;112(6);1394-7.  Updated June 2014

## 2015-07-29 NOTE — Progress Notes (Signed)
[place] NOTE    Chief Complaint  Chief Complaint   Patient presents with   . New Patient Consult        Interval History  Dakota Marsh is a 64 year old year old man who returns for evaluation for lung transplantation for emphysema and COPD      He has been hospitalized one time in eight year for COPD.  He now requires supplemental.     He has not history of symptoms of heart disease, angina, heart failure, or arrhythmia.    On an initial cardiac ultrasound study was somewhat low.  On repeat testing at [facility] on 26 March 2015 his ejection fraction was 70% with no regional contraction abnormalities       He is scheduled to undergo heart catheterization on 28 Jun 2015.               Social History     Social History Narrative     Social History   Substance Use Topics   . Smoking status: Former Research scientist (life sciences)   . Smokeless tobacco: Former Systems developer   . Alcohol Use: No     Family History   Problem Relation Age of Onset   . No Family Hx No Hx Of      Patient Active Problem List   Diagnosis   . Encounter for pre-transplant evaluation for lung transplant   . COPD (chronic obstructive pulmonary disease) ([place])   . Hypertension   . GERD (gastroesophageal reflux disease)   . Depression     Outpatient Prescriptions Prior to Visit   Medication Sig Dispense Refill   . Albuterol Sulfate (PROAIR HFA IN) Inhale 1 puff by mouth every 6 hours as needed.     . Budesonide-Formoterol Fumarate 160-4.5 MCG/ACT Inhalation Aerosol Inhale 2 puffs by mouth 2 times a day.     . Ibuprofen 200 MG Oral Cap Take 200 mg by mouth daily as needed.     . Lisinopril-Hydrochlorothiazide 20-12.5 MG Oral Tab Take 1 tablet by mouth daily.     . Tiotropium Bromide Monohydrate 1.25 MCG/ACT Inhalation Aero Soln Inhale 1 puff by mouth 2 times a day.     . TraMADol HCl 50 MG Oral Tab Take 100 mg by mouth 2 times a day as needed.     . TraZODone HCl 50 MG Oral Tab Take 100 mg by mouth at bedtime.     . Venlafaxine HCl 100 MG  Oral Tab Take 1 tablet by mouth 2 times a day.       No facility-administered medications prior to visit.     Review of patient's allergies indicates:  Allergies   Allergen Reactions   . Codeine      ANXIETY/DYSPNEA       Review of Systems  Constitutional: Positive for a problem going to sleep.  He takes trazadone.  His appetite has improved.;  He has gained up to 5 pounds.  He has no fever or chills.   .   Eyes: Positive for glasses.  No cataracts, macrular degeneration, and no glaucome. .   Ears, Nose, Mouth, Throat: Positive for some nasal obstruction secondary to a broken nose in the past. .  Cardiovascular: As noted in HPI above  Respiratory: Positive for limited 6 minute walk.  .  Gastrointestinal: Negative including a recent colonoscopy.  Known histdory of GERD  Genitourinary: Positive for nocturia 0-1.   Musculoskeletal: Negative   Skin: Negative Gets regular  skin exam after a skin cancer.    Neurological: Negative   Psychiatric: Very good humor but history of depression and anxiety   Endocrine: Negative   Hematologic/Lymphatic: Negative  Allergic/Immunologic: Negative   A complete review of systems through the [facility] Sheet was otherwise negative except as noted above.     Physical Exam  Constitutional: He appears well-developed and well-nourished.  healthy, alert and no distress. BP 109/65 mmHg  Pulse 89  Temp(Src) 97.9 F (36.6 C) (Temporal)  Ht 5' 8.5" (1.74 m)  Wt 147 lb (66.679 kg)  BMI 22.02 kg/m2  SpO2 96% .  Eyes:  Normal sclera. Pupils are equal and reactive.  Head/Neck: No adenopathy in the neck. Flexion and extension of the neck to the left and right is not limited. Rotation of the neck is not limited. Thyroid is not enlarged or noduled. No evidence of aortic pulsation at suprasternal notch. Full carotid pulse. No bruit. Jugular venous pressure normal.   Mouth: No significant findings of the teeth and buccal mucosa. Mallampati score: unknown.    Respiratory/Chest: No Rales/Wheezes/Rhonchi. Diaphragm moves well and symmetrically. Lungs expand well and symmetrically with inspiration. Lungs are clear.   Cardiovascular:rate: normal , rhythm: regular , PMI normal, no lift, heave, or thrill, regular rate and rhythm, S1 normal, S2 normal, no murmurs, clicks, or gallops, heart tones are normal.   Pulses:  Posterior tibial 2+ and dorsalis pedae pulses are 2+ bilaterally. Radial pulses are 2+ bilaterally. Ulnar pulses are 1+ bilaterally.   Gastrointestinal: Abdomen supple without tenderness, masses or hepatosplenomegaly. Spleen is not palpable. Liver is not enlarged. Normal bowel sounds. Aorta is not prominent.   Musculoskeletal: Normal gait/stance. No evident joint deformities in the hands. No evident deformities in the knees. No kyphoscoliosis.   Extremities: No edema. No calf tenderness.   Skin: Warm and dry.   Neurological: Grossly intact.  Psychiatric: He is oriented to person, place, time and situation.      Laboratory Results  CHOLESTEROL (HDL)   Date Value Ref Range Status   04/04/2015 83 >39 mg/dL Final     CHOLESTEROL (LDL)   Date Value Ref Range Status   04/04/2015 132* <130 mg/dL Final     CHOLESTEROL (TOTAL)   Date Value Ref Range Status   04/04/2015 231* <200 mg/dL Final     CHOLESTEROL/HDL RATIO   Date Value Ref Range Status   04/04/2015 2.8  Final     TRIGLYCERIDE   Date Value Ref Range Status   04/04/2015 80 <150 mg/dL Final     No results found for: NA  No results found for: K  CHLORIDE   Date Value Ref Range Status   07/01/2015 98 98 - 108 meq/L Final     CARBON DIOXIDE, TOTAL   Date Value Ref Range Status   07/01/2015 34* 22 - 32 meq/L Final     ANION GAP   Date Value Ref Range Status   07/01/2015 7 4 - 12 Final     GLUCOSE   Date Value Ref Range Status   07/01/2015 94 62 - 125 mg/dL Final     UREA NITROGEN   Date Value Ref Range Status   07/01/2015 14 8 - 21 mg/dL Final      CALCIUM   Date Value Ref Range Status   07/01/2015 9.6 8.9 - 10.2  mg/dL Final     HEMOGLOBIN A1C (%)   Date Value   04/04/2015 5.5  THYROID STIMULATING HORMONE (u[IU]/mL)   Date Value   04/04/2015 1.086      No results found for this or any previous visit.   CREATININE     0.73   07/01/2015      28 Jun 2015  I have reviewed the coronary angiograms and hemodynamic data and no significant valvular, coronary artery or heart muscle disease.     Assessment/Plan  (Z01.818) Encounter for pre-transplant evaluation for lung transplant  (primary encounter diagnosis)  I concur that there are no contraindications for lung transplant for a cardiac point of view.  I recommend that his atorvastatin be raise to 80 mg from 40 mg in the pretransplant perio.   (I10) Essential hypertension - not an issue at the present time.    RTC in 6 months.

## 2015-07-29 NOTE — Patient Instructions (Signed)
Dakota Marsh,    Today we reviwed your candidacy for a lung transplantation.      You have a modest narrowing of the mid right coronary artery that is very short.  I measure the extent of narrowing at 30% by quantitative analysis.      I am able to recommend that we do not need to do a stent prior to listing for this lesion if we start you on atorvastatin to stable any plaques which you may have in your arteries that we cannot see.    Your prescription is for atorvastatin 40 mg #90 tablets to be taken at bedtime for the foreseeable future.    If you have any questions, I can be reached at [phone].  Your cardiology nurse, Vivianne Spence may be reached at [phone],    I would like you to be seen in three 6 months if you do have not had a transplant by then    Kandis Nab, MD

## 2015-09-03 ENCOUNTER — Encounter (HOSPITAL_COMMUNITY): Payer: Self-pay

## 2015-09-04 ENCOUNTER — Inpatient Hospital Stay
Admission: AD | Admit: 2015-09-04 | Discharge: 2015-09-20 | DRG: 007 | Disposition: A | Discharge status: Home / Self Care | Payer: Medicare Other | Attending: Thoracic Surgery (Cardiothoracic Vascular Surgery) | Admitting: Thoracic Surgery (Cardiothoracic Vascular Surgery)

## 2015-09-04 ENCOUNTER — Inpatient Hospital Stay (HOSPITAL_COMMUNITY): Payer: Medicare Other | Admitting: Thoracic Surgery (Cardiothoracic Vascular Surgery)

## 2015-09-04 ENCOUNTER — Other Ambulatory Visit: Payer: Self-pay | Admitting: General Practice

## 2015-09-04 DIAGNOSIS — I2510 Atherosclerotic heart disease of native coronary artery without angina pectoris: Secondary | ICD-10-CM | POA: Diagnosis present

## 2015-09-04 DIAGNOSIS — J984 Other disorders of lung: Secondary | ICD-10-CM | POA: Diagnosis present

## 2015-09-04 DIAGNOSIS — R410 Disorientation, unspecified: Secondary | ICD-10-CM | POA: Diagnosis not present

## 2015-09-04 DIAGNOSIS — I10 Essential (primary) hypertension: Secondary | ICD-10-CM

## 2015-09-04 DIAGNOSIS — J449 Chronic obstructive pulmonary disease, unspecified: Principal | ICD-10-CM | POA: Diagnosis present

## 2015-09-04 DIAGNOSIS — F329 Major depressive disorder, single episode, unspecified: Secondary | ICD-10-CM | POA: Diagnosis present

## 2015-09-04 DIAGNOSIS — Z87891 Personal history of nicotine dependence: Secondary | ICD-10-CM

## 2015-09-04 DIAGNOSIS — I272 Other secondary pulmonary hypertension: Secondary | ICD-10-CM | POA: Diagnosis present

## 2015-09-04 DIAGNOSIS — R739 Hyperglycemia, unspecified: Secondary | ICD-10-CM | POA: Diagnosis not present

## 2015-09-04 DIAGNOSIS — G8918 Other acute postprocedural pain: Secondary | ICD-10-CM | POA: Diagnosis not present

## 2015-09-04 DIAGNOSIS — J439 Emphysema, unspecified: Secondary | ICD-10-CM

## 2015-09-04 DIAGNOSIS — F419 Anxiety disorder, unspecified: Secondary | ICD-10-CM | POA: Diagnosis present

## 2015-09-04 DIAGNOSIS — T380X5A Adverse effect of glucocorticoids and synthetic analogues, initial encounter: Secondary | ICD-10-CM | POA: Diagnosis not present

## 2015-09-04 DIAGNOSIS — R578 Other shock: Secondary | ICD-10-CM | POA: Diagnosis not present

## 2015-09-04 DIAGNOSIS — J9611 Chronic respiratory failure with hypoxia: Secondary | ICD-10-CM | POA: Diagnosis present

## 2015-09-04 DIAGNOSIS — K219 Gastro-esophageal reflux disease without esophagitis: Secondary | ICD-10-CM | POA: Diagnosis present

## 2015-09-04 LAB — CBC, DIFF
% Basophils: 0 %
% Eosinophils: 2 %
% Immature Granulocytes: 0 %
% Lymphocytes: 24 %
% Monocytes: 10 %
% Neutrophils: 64 %
% Nucleated RBC: 0 %
Absolute Eosinophil Count: 0.19 10*3/uL (ref 0.00–0.50)
Absolute Lymphocyte Count: 2.19 10*3/uL (ref 1.00–4.80)
Basophils: 0.04 10*3/uL (ref 0.00–0.20)
Hematocrit: 39 % (ref 38–50)
Hemoglobin: 12.4 g/dL — ABNORMAL LOW (ref 13.0–18.0)
Immature Granulocytes: 0.04 10*3/uL (ref 0.00–0.05)
MCH: 29.5 pg (ref 27.3–33.6)
MCHC: 32.1 g/dL — ABNORMAL LOW (ref 32.2–36.5)
MCV: 92 fL (ref 81–98)
Monocytes: 0.9 10*3/uL — ABNORMAL HIGH (ref 0.00–0.80)
Neutrophils: 5.89 10*3/uL (ref 1.80–7.00)
Nucleated RBC: 0 10*3/uL
Platelet Count: 278 10*3/uL (ref 150–400)
RBC: 4.2 10*6/uL — ABNORMAL LOW (ref 4.40–5.60)
RDW-CV: 13.2 % (ref 11.6–14.4)
WBC: 9.25 10*3/uL (ref 4.3–10.0)

## 2015-09-04 LAB — HLA TESTING (SENDOUT)

## 2015-09-05 ENCOUNTER — Other Ambulatory Visit (HOSPITAL_BASED_OUTPATIENT_CLINIC_OR_DEPARTMENT_OTHER): Payer: Self-pay | Admitting: Thoracic Surgery (Cardiothoracic Vascular Surgery)

## 2015-09-05 ENCOUNTER — Other Ambulatory Visit: Payer: Self-pay | Admitting: Student in an Organized Health Care Education/Training Program

## 2015-09-05 ENCOUNTER — Encounter (HOSPITAL_COMMUNITY): Payer: Self-pay

## 2015-09-05 ENCOUNTER — Other Ambulatory Visit (HOSPITAL_COMMUNITY): Payer: Self-pay

## 2015-09-05 DIAGNOSIS — J439 Emphysema, unspecified: Secondary | ICD-10-CM

## 2015-09-05 DIAGNOSIS — G8918 Other acute postprocedural pain: Secondary | ICD-10-CM

## 2015-09-05 DIAGNOSIS — I272 Other secondary pulmonary hypertension: Secondary | ICD-10-CM

## 2015-09-05 DIAGNOSIS — I4581 Long QT syndrome: Secondary | ICD-10-CM

## 2015-09-05 DIAGNOSIS — Z4824 Encounter for aftercare following lung transplant: Secondary | ICD-10-CM

## 2015-09-05 DIAGNOSIS — J449 Chronic obstructive pulmonary disease, unspecified: Secondary | ICD-10-CM

## 2015-09-05 DIAGNOSIS — A150 Tuberculosis of lung: Secondary | ICD-10-CM

## 2015-09-05 LAB — OR BLOOD GAS PANEL, ART
Base Deficit Blood, ART: 2.4 meq/L — ABNORMAL HIGH (ref 0.0–2.0)
Base Deficit Blood, ART: 1.2 meq/L (ref 0.0–2.0)
Base Deficit Blood, ART: 0.8 meq/L (ref 0.0–2.0)
Base Deficit Blood, ART: 1.7 meq/L (ref 0.0–2.0)
Base Deficit Blood, ART: 5.3 meq/L — ABNORMAL HIGH (ref 0.0–2.0)
Base Deficit Blood, ART: 5.8 meq/L — ABNORMAL HIGH (ref 0.0–2.0)
Base Deficit Blood, ART: 6.3 meq/L — ABNORMAL HIGH (ref 0.0–2.0)
Base Deficit Blood, ART: 1.9 meq/L (ref 0.0–2.0)
Base Deficit Blood, ART: 3 meq/L — ABNORMAL HIGH (ref 0.0–2.0)
Base Excess Blood, ART: 5.6 meq/L — ABNORMAL HIGH (ref 0.0–3.0)
Base Excess Blood, ART: 0.5 meq/L (ref 0.0–3.0)
Base Excess Blood, ART: 1.3 meq/L (ref 0.0–3.0)
Base Excess Blood, ART: 1.6 meq/L (ref 0.0–3.0)
Base Excess Blood, ART: 0.7 meq/L (ref 0.0–3.0)
Bicarbonate: 31 meq/L — ABNORMAL HIGH (ref 22–26)
Bicarbonate: 28 meq/L — ABNORMAL HIGH (ref 22–26)
Bicarbonate: 29 meq/L — ABNORMAL HIGH (ref 22–26)
Bicarbonate: 23 meq/L (ref 22–26)
Bicarbonate: 27 meq/L — ABNORMAL HIGH (ref 22–26)
Bicarbonate: 24 meq/L (ref 22–26)
Bicarbonate: 25 meq/L (ref 22–26)
Bicarbonate: 20 meq/L — ABNORMAL LOW (ref 22–26)
Bicarbonate: 23 meq/L (ref 22–26)
Bicarbonate: 25 meq/L (ref 22–26)
Bicarbonate: 20 meq/L — ABNORMAL LOW (ref 22–26)
Bicarbonate: 22 meq/L (ref 22–26)
Bicarbonate: 23 meq/L (ref 22–26)
Bicarbonate: 23 meq/L (ref 22–26)
Calcium (Ionized): 1.2 mmol/L (ref 1.18–1.38)
Calcium (Ionized): 1.11 mmol/L — ABNORMAL LOW (ref 1.18–1.38)
Calcium (Ionized): 1.11 mmol/L — ABNORMAL LOW (ref 1.18–1.38)
Calcium (Ionized): 0.74 mmol/L — CL (ref 1.18–1.38)
Calcium (Ionized): 0.96 mmol/L — ABNORMAL LOW (ref 1.18–1.38)
Calcium (Ionized): 0.8 mmol/L — ABNORMAL LOW (ref 1.18–1.38)
Calcium (Ionized): 0.82 mmol/L — ABNORMAL LOW (ref 1.18–1.38)
Calcium (Ionized): 0.83 mmol/L — ABNORMAL LOW (ref 1.18–1.38)
Calcium (Ionized): 0.84 mmol/L — ABNORMAL LOW (ref 1.18–1.38)
Calcium (Ionized): 1.08 mmol/L — ABNORMAL LOW (ref 1.18–1.38)
Calcium (Ionized): 1.19 mmol/L (ref 1.18–1.38)
Calcium (Ionized): 1.32 mmol/L (ref 1.18–1.38)
Calcium (Ionized): 1.17 mmol/L — ABNORMAL LOW (ref 1.18–1.38)
Calcium (Ionized): 1.18 mmol/L (ref 1.18–1.38)
Carboxyhemoglobin, ART: 0.7 %
Carboxyhemoglobin, ART: 0.7 %
Carboxyhemoglobin, ART: 0.7 %
Carboxyhemoglobin, ART: 0.9 %
Carboxyhemoglobin, ART: 1.1 %
Carboxyhemoglobin, ART: 0.9 %
Carboxyhemoglobin, ART: 1.3 %
Carboxyhemoglobin, ART: 1 %
Carboxyhemoglobin, ART: 1 %
Carboxyhemoglobin, ART: 1.4 %
Carboxyhemoglobin, ART: 0.8 %
Carboxyhemoglobin, ART: 1 %
Carboxyhemoglobin, ART: 0.9 %
Carboxyhemoglobin, ART: 0.9 %
Glucose: 100 mg/dL (ref 62–125)
Glucose: 200 mg/dL — ABNORMAL HIGH (ref 62–125)
Glucose: 209 mg/dL — ABNORMAL HIGH (ref 62–125)
Glucose: 196 mg/dL — ABNORMAL HIGH (ref 62–125)
Glucose: 275 mg/dL — ABNORMAL HIGH (ref 62–125)
Glucose: 256 mg/dL — ABNORMAL HIGH (ref 62–125)
Glucose: 269 mg/dL — ABNORMAL HIGH (ref 62–125)
Glucose: 261 mg/dL — ABNORMAL HIGH (ref 62–125)
Glucose: 275 mg/dL — ABNORMAL HIGH (ref 62–125)
Glucose: 278 mg/dL — ABNORMAL HIGH (ref 62–125)
Glucose: 224 mg/dL — ABNORMAL HIGH (ref 62–125)
Glucose: 235 mg/dL — ABNORMAL HIGH (ref 62–125)
Glucose: 148 mg/dL — ABNORMAL HIGH (ref 62–125)
Glucose: 192 mg/dL — ABNORMAL HIGH (ref 62–125)
Hematocrit: 36 % — ABNORMAL LOW (ref 38–50)
Hematocrit: 32 % — ABNORMAL LOW (ref 38–50)
Hematocrit: 34 % — ABNORMAL LOW (ref 38–50)
Hematocrit: 20 % — ABNORMAL LOW (ref 38–50)
Hematocrit: 31 % — ABNORMAL LOW (ref 38–50)
Hematocrit: 29 % — ABNORMAL LOW (ref 38–50)
Hematocrit: 31 % — ABNORMAL LOW (ref 38–50)
Hematocrit: 27 % — ABNORMAL LOW (ref 38–50)
Hematocrit: 29 % — ABNORMAL LOW (ref 38–50)
Hematocrit: 30 % — ABNORMAL LOW (ref 38–50)
Hematocrit: 23 % — ABNORMAL LOW (ref 38–50)
Hematocrit: 26 % — ABNORMAL LOW (ref 38–50)
Hematocrit: 29 % — ABNORMAL LOW (ref 38–50)
Hematocrit: 31 % — ABNORMAL LOW (ref 38–50)
L Lactate (Direct), ART WB: 0.9 mmol/L (ref 0.4–1.0)
L Lactate (Direct), ART WB: 1 mmol/L (ref 0.4–1.0)
L Lactate (Direct), ART WB: 1.4 mmol/L — ABNORMAL HIGH (ref 0.4–1.0)
L Lactate (Direct), ART WB: 1.9 mmol/L — ABNORMAL HIGH (ref 0.4–1.0)
L Lactate (Direct), ART WB: 3.3 mmol/L — ABNORMAL HIGH (ref 0.4–1.0)
L Lactate (Direct), ART WB: 3.8 mmol/L — ABNORMAL HIGH (ref 0.4–1.0)
L Lactate (Direct), ART WB: 4.8 mmol/L — ABNORMAL HIGH (ref 0.4–1.0)
L Lactate (Direct), ART WB: 5.7 mmol/L — ABNORMAL HIGH (ref 0.4–1.0)
L Lactate (Direct), ART WB: 5.7 mmol/L — ABNORMAL HIGH (ref 0.4–1.0)
L Lactate (Direct), ART WB: 6.9 mmol/L — ABNORMAL HIGH (ref 0.4–1.0)
L Lactate (Direct), ART WB: 6.1 mmol/L — ABNORMAL HIGH (ref 0.4–1.0)
L Lactate (Direct), ART WB: 7.1 mmol/L — ABNORMAL HIGH (ref 0.4–1.0)
L Lactate (Direct), ART WB: 4.9 mmol/L — ABNORMAL HIGH (ref 0.4–1.0)
L Lactate (Direct), ART WB: 5 mmol/L — ABNORMAL HIGH (ref 0.4–1.0)
Methemoglobin, ART: 0.4 % (ref <3.0)
Methemoglobin, ART: 0.9 % (ref <3.0)
Methemoglobin, ART: 1.2 % (ref <3.0)
Methemoglobin, ART: 1.1 % (ref <3.0)
Methemoglobin, ART: <0.1 % (ref <3.0)
Methemoglobin, ART: 0.4 % (ref <3.0)
Methemoglobin, ART: 0.5 % (ref <3.0)
Methemoglobin, ART: 0.7 % (ref <3.0)
Methemoglobin, ART: 0.8 % (ref <3.0)
Methemoglobin, ART: 1 % (ref <3.0)
Methemoglobin, ART: 0.3 % (ref <3.0)
Methemoglobin, ART: 0.9 % (ref <3.0)
Methemoglobin, ART: 0.7 % (ref <3.0)
Methemoglobin, ART: 1 % (ref <3.0)
O2 Content: 17.3 VOL% (ref 15–23)
O2 Content: 14.6 VOL% — ABNORMAL LOW (ref 15–23)
O2 Content: 15.9 VOL% (ref 15–23)
O2 Content: 10.3 VOL% — ABNORMAL LOW (ref 15–23)
O2 Content: 13.9 VOL% — ABNORMAL LOW (ref 15–23)
O2 Content: 14.8 VOL% — ABNORMAL LOW (ref 15–23)
O2 Content: 13 VOL% — ABNORMAL LOW (ref 15–23)
O2 Content: 14 VOL% — ABNORMAL LOW (ref 15–23)
O2 Content: 9.5 VOL% — ABNORMAL LOW (ref 15–23)
O2 Content: 11.3 VOL% — ABNORMAL LOW (ref 15–23)
O2 Content: 12.5 VOL% — ABNORMAL LOW (ref 15–23)
O2 Content: 14.3 VOL% — ABNORMAL LOW (ref 15–23)
O2 Content: 14.3 VOL% — ABNORMAL LOW (ref 15–23)
O2 Sat (Frac.), ART: 99 % (ref 94–99)
O2 Sat (Frac.), ART: 97 % (ref 94–99)
O2 Sat (Frac.), ART: 98 % (ref 94–99)
O2 Sat (Frac.), ART: 99 % (ref 94–99)
O2 Sat (Frac.), ART: 99 % (ref 94–99)
O2 Sat (Frac.), ART: 99 % (ref 94–99)
O2 Sat (Frac.), ART: 99 % (ref 94–99)
O2 Sat (Frac.), ART: 69 % — ABNORMAL LOW (ref 94–99)
O2 Sat (Frac.), ART: 98 % (ref 94–99)
O2 Sat (Frac.), ART: 99 % (ref 94–99)
O2 Sat (Frac.), ART: 99 % (ref 94–99)
O2 Sat (Frac.), ART: 99 % (ref 94–99)
O2 Sat (Frac.), ART: 98 % (ref 94–99)
O2 Sat (Frac.), ART: 99 % (ref 94–99)
Potassium: 4.3 meq/L (ref 3.7–5.2)
Potassium: 4.2 meq/L (ref 3.7–5.2)
Potassium: 4.2 meq/L (ref 3.7–5.2)
Potassium: 4.3 meq/L (ref 3.7–5.2)
Potassium: 4.4 meq/L (ref 3.7–5.2)
Potassium: 3.3 meq/L — ABNORMAL LOW (ref 3.7–5.2)
Potassium: 3.4 meq/L — ABNORMAL LOW (ref 3.7–5.2)
Potassium: 3 meq/L — ABNORMAL LOW (ref 3.7–5.2)
Potassium: 3 meq/L — ABNORMAL LOW (ref 3.7–5.2)
Potassium: 3.1 meq/L — ABNORMAL LOW (ref 3.7–5.2)
Potassium: 3.1 meq/L — ABNORMAL LOW (ref 3.7–5.2)
Potassium: 3.4 meq/L — ABNORMAL LOW (ref 3.7–5.2)
Potassium: 3.4 meq/L — ABNORMAL LOW (ref 3.7–5.2)
Potassium: 3.8 meq/L (ref 3.7–5.2)
Sodium: 139 meq/L (ref 136–145)
Sodium: 136 meq/L (ref 136–145)
Sodium: 136 meq/L (ref 136–145)
Sodium: 131 meq/L — ABNORMAL LOW (ref 136–145)
Sodium: 132 meq/L — ABNORMAL LOW (ref 136–145)
Sodium: 135 meq/L — ABNORMAL LOW (ref 136–145)
Sodium: 136 meq/L (ref 136–145)
Sodium: 133 meq/L — ABNORMAL LOW (ref 136–145)
Sodium: 136 meq/L (ref 136–145)
Sodium: 136 meq/L (ref 136–145)
Sodium: 137 meq/L (ref 136–145)
Sodium: 138 meq/L (ref 136–145)
Sodium: 137 meq/L (ref 136–145)
Sodium: 138 meq/L (ref 136–145)
Total Hemoglobin, ART: 11.7 g/dL — ABNORMAL LOW (ref 13.0–18.0)
Total Hemoglobin, ART: 10.4 g/dL — ABNORMAL LOW (ref 13.0–18.0)
Total Hemoglobin, ART: 11.1 g/dL — ABNORMAL LOW (ref 13.0–18.0)
Total Hemoglobin, ART: 10 g/dL — ABNORMAL LOW (ref 13.0–18.0)
Total Hemoglobin, ART: 6.6 g/dL — ABNORMAL LOW (ref 13.0–18.0)
Total Hemoglobin, ART: 10.2 g/dL — ABNORMAL LOW (ref 13.0–18.0)
Total Hemoglobin, ART: 9.5 g/dL — ABNORMAL LOW (ref 13.0–18.0)
Total Hemoglobin, ART: 8.7 g/dL — ABNORMAL LOW (ref 13.0–18.0)
Total Hemoglobin, ART: 9.5 g/dL — ABNORMAL LOW (ref 13.0–18.0)
Total Hemoglobin, ART: 9.7 g/dL — ABNORMAL LOW (ref 13.0–18.0)
Total Hemoglobin, ART: 7.3 g/dL — ABNORMAL LOW (ref 13.0–18.0)
Total Hemoglobin, ART: 8.4 g/dL — ABNORMAL LOW (ref 13.0–18.0)
Total Hemoglobin, ART: 10.1 g/dL — ABNORMAL LOW (ref 13.0–18.0)
Total Hemoglobin, ART: 9.5 g/dL — ABNORMAL LOW (ref 13.0–18.0)
pCO2, ART: 51 mmHg — ABNORMAL HIGH (ref 33–48)
pCO2, ART: 60 mmHg — ABNORMAL HIGH (ref 33–48)
pCO2, ART: 62 mmHg — ABNORMAL HIGH (ref 33–48)
pCO2, ART: 44 mmHg (ref 33–48)
pCO2, ART: 44 mmHg (ref 33–48)
pCO2, ART: 39 mmHg (ref 33–48)
pCO2, ART: 39 mmHg (ref 33–48)
pCO2, ART: 39 mmHg (ref 33–48)
pCO2, ART: 40 mmHg (ref 33–48)
pCO2, ART: 48 mmHg (ref 33–48)
pCO2, ART: 46 mmHg (ref 33–48)
pCO2, ART: 58 mmHg — ABNORMAL HIGH (ref 33–48)
pCO2, ART: 38 mmHg (ref 33–48)
pCO2, ART: 44 mmHg (ref 33–48)
pH, ART: 7.39 (ref 7.35–7.45)
pH, ART: 7.27 — ABNORMAL LOW (ref 7.35–7.45)
pH, ART: 7.27 — ABNORMAL LOW (ref 7.35–7.45)
pH, ART: 7.33 — ABNORMAL LOW (ref 7.35–7.45)
pH, ART: 7.39 (ref 7.35–7.45)
pH, ART: 7.39 (ref 7.35–7.45)
pH, ART: 7.42 (ref 7.35–7.45)
pH, ART: 7.33 — ABNORMAL LOW (ref 7.35–7.45)
pH, ART: 7.33 — ABNORMAL LOW (ref 7.35–7.45)
pH, ART: 7.38 (ref 7.35–7.45)
pH, ART: 7.19 — CL (ref 7.35–7.45)
pH, ART: 7.25 — ABNORMAL LOW (ref 7.35–7.45)
pH, ART: 7.33 — ABNORMAL LOW (ref 7.35–7.45)
pH, ART: 7.39 (ref 7.35–7.45)
pO2: 392 mmHg — ABNORMAL HIGH (ref 63–87)
pO2: 143 mmHg — ABNORMAL HIGH (ref 63–87)
pO2: 268 mmHg — ABNORMAL HIGH (ref 63–87)
pO2: 299 mmHg — ABNORMAL HIGH (ref 63–87)
pO2: 420 mmHg — ABNORMAL HIGH (ref 63–87)
pO2: 280 mmHg — ABNORMAL HIGH (ref 63–87)
pO2: 299 mmHg — ABNORMAL HIGH (ref 63–87)
pO2: 311 mmHg — ABNORMAL HIGH (ref 63–87)
pO2: 358 mmHg — ABNORMAL HIGH (ref 63–87)
pO2: 38 mmHg — CL (ref 63–87)
pO2: 328 mmHg — ABNORMAL HIGH (ref 63–87)
pO2: 411 mmHg — ABNORMAL HIGH (ref 63–87)
pO2: 179 mmHg — ABNORMAL HIGH (ref 63–87)
pO2: 443 mmHg — ABNORMAL HIGH (ref 63–87)

## 2015-09-05 LAB — BLOOD FUNGAL CULTURE

## 2015-09-05 LAB — COMPREHENSIVE METABOLIC PANEL
ALT (GPT): 12 U/L (ref 10–48)
ALT (GPT): 27 U/L (ref 10–48)
AST (GOT): 16 U/L (ref 9–38)
AST (GOT): 68 U/L — ABNORMAL HIGH (ref 9–38)
Albumin: 2.3 g/dL — ABNORMAL LOW (ref 3.5–5.2)
Albumin: 4.1 g/dL (ref 3.5–5.2)
Alkaline Phosphatase (Total): 36 U/L (ref 36–161)
Alkaline Phosphatase (Total): 65 U/L (ref 36–161)
Anion Gap: 11 (ref 4–12)
Anion Gap: 9 (ref 4–12)
Bilirubin (Total): 0.3 mg/dL (ref 0.2–1.3)
Bilirubin (Total): 2 mg/dL — ABNORMAL HIGH (ref 0.2–1.3)
Calcium: 8.2 mg/dL — ABNORMAL LOW (ref 8.9–10.2)
Calcium: 9.2 mg/dL (ref 8.9–10.2)
Carbon Dioxide, Total: 22 meq/L (ref 22–32)
Carbon Dioxide, Total: 30 meq/L (ref 22–32)
Chloride: 100 meq/L (ref 98–108)
Chloride: 106 meq/L (ref 98–108)
Creatinine: 0.78 mg/dL (ref 0.51–1.18)
Creatinine: 0.82 mg/dL (ref 0.51–1.18)
GFR, Calc, African American: >60 mL/min/{1.73_m2}
GFR, Calc, African American: >60 mL/min/{1.73_m2}
GFR, Calc, European American: >60 mL/min/{1.73_m2}
GFR, Calc, European American: >60 mL/min/{1.73_m2}
Glucose: 118 mg/dL (ref 62–125)
Glucose: 91 mg/dL (ref 62–125)
Potassium: 4.2 meq/L (ref 3.6–5.2)
Potassium: 4.3 meq/L (ref 3.6–5.2)
Protein (Total): 4 g/dL — ABNORMAL LOW (ref 6.0–8.2)
Protein (Total): 6.8 g/dL (ref 6.0–8.2)
Sodium: 139 meq/L (ref 135–145)
Sodium: 139 meq/L (ref 135–145)
Urea Nitrogen: 15 mg/dL (ref 8–21)
Urea Nitrogen: 20 mg/dL (ref 8–21)

## 2015-09-05 LAB — URINALYSIS COMPLETE, URN
Bacteria, URN: NONE SEEN
Bilirubin (Qual), URN: NEGATIVE
Epith Cells_Renal/Trans,URN: NEGATIVE /HPF
Epith Cells_Squamous, URN: NEGATIVE /LPF
Glucose Qual, URN: NEGATIVE mg/dL
Ketones, URN: NEGATIVE mg/dL
Leukocyte Esterase, URN: NEGATIVE
Nitrite, URN: NEGATIVE
Occult Blood, URN: NEGATIVE
Protein (Alb Semiquant), URN: NEGATIVE mg/dL
RBC, URN: NEGATIVE /HPF
Specific Gravity, URN: 1.013 g/mL (ref 1.002–1.027)
WBC, URN: NEGATIVE /HPF
pH, URN: 6 (ref 5.0–8.0)

## 2015-09-05 LAB — BLOOD GAS, ARTERIAL (NO ELECTROLYTES)
Base Deficit Blood, ART: 2.1 meq/L — ABNORMAL HIGH (ref 0.0–2.0)
Base Deficit Blood, ART: 1 meq/L (ref 0.0–2.0)
Bicarbonate: 24 meq/L (ref 22–26)
Bicarbonate: 25 meq/L (ref 22–26)
Fi (O2) [%]: 100 %
Fi (O2) [%]: 50 %
O2 Saturation: 100 % — ABNORMAL HIGH (ref 95–99)
O2 Saturation: 99 % (ref 95–99)
PEEP (Pos End Expir Pres): 5
PEEP (Pos End Expir Pres): 5
pCO2, ART: 43 mmHg (ref 33–48)
pCO2, ART: 48 mmHg (ref 33–48)
pH, ART: 7.35 (ref 7.35–7.45)
pH, ART: 7.33 — ABNORMAL LOW (ref 7.35–7.45)
pO2: 257 mmHg — ABNORMAL HIGH (ref 63–87)
pO2: 164 mmHg — ABNORMAL HIGH (ref 63–87)

## 2015-09-05 LAB — PREPARE PLASMA: Units Ordered: 8

## 2015-09-05 LAB — OR BLOOD GAS PANEL, VEN
Base Deficit Blood, VEN: 1.1 meq/L (ref 0.0–2.0)
Base Deficit Blood, VEN: 0.3 meq/L (ref 0.0–2.0)
Base Deficit Blood, VEN: 4.8 meq/L — ABNORMAL HIGH (ref 0.0–2.0)
Base Excess, Blood, VEN: 2.3 meq/L (ref 0.0–3.0)
Base Excess, Blood, VEN: 1.8 meq/L (ref 0.0–3.0)
Bicarbonate, VEN: 25 meq/L (ref 23–27)
Bicarbonate, VEN: 29 meq/L — ABNORMAL HIGH (ref 23–27)
Bicarbonate, VEN: 26 meq/L (ref 23–27)
Bicarbonate, VEN: 27 meq/L (ref 23–27)
Bicarbonate, VEN: 22 meq/L — ABNORMAL LOW (ref 23–27)
Calcium (Ionized): 0.77 mmol/L — CL (ref 1.18–1.38)
Calcium (Ionized): 0.97 mmol/L — ABNORMAL LOW (ref 1.18–1.38)
Calcium (Ionized): 0.81 mmol/L — ABNORMAL LOW (ref 1.18–1.38)
Calcium (Ionized): 0.84 mmol/L — ABNORMAL LOW (ref 1.18–1.38)
Calcium (Ionized): 1.09 mmol/L — ABNORMAL LOW (ref 1.18–1.38)
Carboxyhemoglobin, VEN: 1.2 %
Carboxyhemoglobin, VEN: 1.4 %
Carboxyhemoglobin, VEN: 1.3 %
Carboxyhemoglobin, VEN: 1.3 %
Carboxyhemoglobin, VEN: 1.2 %
Glucose: 194 mg/dL — ABNORMAL HIGH (ref 62–125)
Glucose: 267 mg/dL — ABNORMAL HIGH (ref 62–125)
Glucose: 242 mg/dL — ABNORMAL HIGH (ref 62–125)
Glucose: 261 mg/dL — ABNORMAL HIGH (ref 62–125)
Glucose: 266 mg/dL — ABNORMAL HIGH (ref 62–125)
Hematocrit: 21 % — ABNORMAL LOW (ref 38–50)
Hematocrit: 32 % — ABNORMAL LOW (ref 38–50)
Hematocrit: 30 % — ABNORMAL LOW (ref 38–50)
Hematocrit: 32 % — ABNORMAL LOW (ref 38–50)
Hematocrit: 27 % — ABNORMAL LOW (ref 38–50)
L Lactate (Direct), Venous Whole Blood: 2.1 mmol/L — ABNORMAL HIGH (ref 0.6–1.9)
L Lactate (Direct), Venous Whole Blood: 3.2 mmol/L — ABNORMAL HIGH (ref 0.6–1.9)
L Lactate (Direct), Venous Whole Blood: 3.8 mmol/L — ABNORMAL HIGH (ref 0.6–1.9)
L Lactate (Direct), Venous Whole Blood: 4.9 mmol/L — ABNORMAL HIGH (ref 0.6–1.9)
L Lactate (Direct), Venous Whole Blood: 7 mmol/L — ABNORMAL HIGH (ref 0.6–1.9)
Methemoglobin, VEN: 1 % (ref <3.0)
Methemoglobin, VEN: 1.5 % (ref <3.0)
Methemoglobin, VEN: 0.6 % (ref <3.0)
Methemoglobin, VEN: 1 % (ref <3.0)
Methemoglobin, VEN: 0.8 % (ref <3.0)
O2 Content, VEN: 10.4 VOL%
O2 Content, VEN: 6.4 VOL%
O2 Content, VEN: 9.9 VOL%
O2 Content, VEN: 9.9 VOL%
O2 Content, VEN: 10.6 VOL%
O2 Saturation (Frac.), VEN: 67 %
O2 Saturation (Frac.), VEN: 71 %
O2 Saturation (Frac.), VEN: 69 %
O2 Saturation (Frac.), VEN: 73 %
O2 Saturation (Frac.), VEN: 86 %
Potassium: 4.3 meq/L (ref 3.7–5.2)
Potassium: 4.3 meq/L (ref 3.7–5.2)
Potassium: 3.3 meq/L — ABNORMAL LOW (ref 3.7–5.2)
Potassium: 3.4 meq/L — ABNORMAL LOW (ref 3.7–5.2)
Potassium: 3.1 meq/L — ABNORMAL LOW (ref 3.7–5.2)
Sodium: 131 meq/L — ABNORMAL LOW (ref 136–145)
Sodium: 132 meq/L — ABNORMAL LOW (ref 136–145)
Sodium: 135 meq/L — ABNORMAL LOW (ref 136–145)
Sodium: 136 meq/L (ref 136–145)
Sodium: 133 meq/L — ABNORMAL LOW (ref 136–145)
Total Hemoglobin, VEN: 10.4 g/dL — ABNORMAL LOW (ref 13.0–18.0)
Total Hemoglobin, VEN: 6.8 g/dL — ABNORMAL LOW (ref 13.0–18.0)
Total Hemoglobin, VEN: 10.3 g/dL — ABNORMAL LOW (ref 13.0–18.0)
Total Hemoglobin, VEN: 9.7 g/dL — ABNORMAL LOW (ref 13.0–18.0)
Total Hemoglobin, VEN: 8.8 g/dL — ABNORMAL LOW (ref 13.0–18.0)
pCO2, VEN: 53 mmHg — ABNORMAL HIGH (ref 42–50)
pCO2, VEN: 58 mmHg — ABNORMAL HIGH (ref 42–50)
pCO2, VEN: 48 mmHg (ref 42–50)
pCO2, VEN: 48 mmHg (ref 42–50)
pCO2, VEN: 49 mmHg (ref 42–50)
pH, VEN: 7.29 — ABNORMAL LOW (ref 7.32–7.40)
pH, VEN: 7.3 — ABNORMAL LOW (ref 7.32–7.40)
pH, VEN: 7.34 (ref 7.32–7.40)
pH, VEN: 7.37 (ref 7.32–7.40)
pH, VEN: 7.26 — ABNORMAL LOW (ref 7.32–7.40)
pO2, VEN: 39 mmHg (ref 35–40)
pO2, VEN: 39 mmHg (ref 35–40)
pO2, VEN: 36 mmHg (ref 35–40)
pO2, VEN: 39 mmHg (ref 35–40)
pO2, VEN: 57 mmHg — ABNORMAL HIGH (ref 35–40)

## 2015-09-05 LAB — ACT HIGH RANGE (POC)
ACT HIGH RANGE (POC): 105 s
ACT HIGH RANGE (POC): 530 s
ACT HIGH RANGE (POC): 613 s
ACT HIGH RANGE (POC): 464 s
ACT HIGH RANGE (POC): 757 s
ACT HIGH RANGE (POC): >1005 s
ACT HIGH RANGE (POC): 595 s
ACT HIGH RANGE (POC): 621 s
ACT HIGH RANGE (POC): 624 s
ACT HIGH RANGE (POC): 883 s
ACT HIGH RANGE (POC): 486 s
ACT HIGH RANGE (POC): 534 s
ACT HIGH RANGE (POC): 542 s
ACT HIGH RANGE (POC): 125 s

## 2015-09-05 LAB — PREPARE PLATELETS: Units Ordered: 1

## 2015-09-05 LAB — BLOOD TYPE CONFIRMATION: ABO/Rh: A POS

## 2015-09-05 LAB — PHOSPHATE
Phosphate: 1.5 mg/dL — ABNORMAL LOW (ref 2.5–4.5)
Phosphate: 3.2 mg/dL (ref 2.5–4.5)

## 2015-09-05 LAB — CBC (HEMOGRAM)
Hematocrit: 37 % — ABNORMAL LOW (ref 38–50)
Hemoglobin: 12.7 g/dL — ABNORMAL LOW (ref 13.0–18.0)
MCH: 29.9 pg (ref 27.3–33.6)
MCHC: 34 g/dL (ref 32.2–36.5)
MCV: 88 fL (ref 81–98)
Platelet Count: 161 10*3/uL (ref 150–400)
RBC: 4.25 10*6/uL — ABNORMAL LOW (ref 4.40–5.60)
RDW-CV: 15.1 % — ABNORMAL HIGH (ref 11.6–14.4)
WBC: 16.01 10*3/uL — ABNORMAL HIGH (ref 4.3–10.0)

## 2015-09-05 LAB — OR HEMOSTASIS PANEL
Fibrinogen: 170 mg/dL (ref 150–450)
Immediate Care Hematocrit: 27 % — ABNORMAL LOW (ref 38–50)
Platelet Count: 117 10*3/uL — ABNORMAL LOW (ref 150–400)
Prothrombin INR: 1.9 — ABNORMAL HIGH (ref 0.8–1.3)
Prothrombin Time Patient: 21.3 s — ABNORMAL HIGH (ref 10.7–15.6)

## 2015-09-05 LAB — TYPE AND SCREEN
ABO/Rh: A POS
Antibody Screen: NEGATIVE
Units Ordered: 9

## 2015-09-05 LAB — PARTIAL THROMBOPLASTIN TIME
Partial Thromboplastin Time: 25 s (ref 22–35)
Partial Thromboplastin Time: 32 s (ref 22–35)

## 2015-09-05 LAB — PROTHROMBIN TIME
Prothrombin INR: 1 (ref 0.8–1.3)
Prothrombin INR: 1.6 — ABNORMAL HIGH (ref 0.8–1.3)
Prothrombin Time Patient: 12.6 s (ref 10.7–15.6)
Prothrombin Time Patient: 18.2 s — ABNORMAL HIGH (ref 10.7–15.6)

## 2015-09-05 LAB — LAB ADD ON ORDER

## 2015-09-05 LAB — COOXIMETRY PANEL, MIXED VEN
Carboxyhemoglobin, Mixed VEN: 1 %
Hemoglobin, MIXED VEN: 13 g/dL (ref 13.0–18.0)
Methemoglobin, Mixed VEN: 0.8 % (ref <3.0)
O2 Content, Mixed VEN: 14.5 VOL%
O2 Sat (Frac.), Mixed VEN: 80 %
O2 Sat (Func.), Mixed VEN: 81 %

## 2015-09-05 LAB — CALCIUM (IONIZED), WB: Calcium (Ionized): 1.21 mmol/L (ref 1.18–1.38)

## 2015-09-05 LAB — MAGNESIUM
Magnesium: 1.9 mg/dL (ref 1.8–2.4)
Magnesium: 2.1 mg/dL (ref 1.8–2.4)

## 2015-09-05 LAB — L_LACTATE, ARTERIAL WHOLE BLOOD
L Lactate (Direct), ART WB: 4.8 mmol/L — ABNORMAL HIGH (ref 0.4–1.0)
L Lactate (Direct), ART WB: 3 mmol/L — ABNORMAL HIGH (ref 0.4–1.0)

## 2015-09-05 LAB — TRIGLYCERIDES: Triglyceride: 93 mg/dL (ref <150)

## 2015-09-05 LAB — GLUCOSE POC, ~~LOC~~
Glucose (POC): 112 mg/dL (ref 62–125)
Glucose (POC): 188 mg/dL — ABNORMAL HIGH (ref 62–125)
Glucose (POC): 87 mg/dL (ref 62–125)

## 2015-09-05 LAB — TGRAPH WITH HEPARINASE
% Lysis 30 Min. After Ma: 0.2 % (ref 0.0–9.0)
Angle: 64.1 Degrees (ref 54.0–80.0)
K: 2 min (ref 0.5–3.0)
MA (Maximum Amplitude): 51.3 mm (ref 51.0–78.0)
R: 4.7 min (ref 3.0–9.0)

## 2015-09-05 LAB — IMMEDIATE CARE HGB AND HCT
Hematocrit: 41 % (ref 38–50)
Hemoglobin: 13.3 g/dL (ref 13.0–18.0)

## 2015-09-05 LAB — HEMATOCRIT: Hematocrit: 37 % — ABNORMAL LOW (ref 38–50)

## 2015-09-05 LAB — POTASSIUM, WBLD: Potassium: 3.8 meq/L (ref 3.7–5.2)

## 2015-09-05 LAB — CHOLESTEROL (TOTAL): Total Cholesterol: 180 mg/dL (ref <200)

## 2015-09-05 LAB — LACTATE DEHYDROGENASE: Lactate Dehydrogenase: 160 U/L (ref <210)

## 2015-09-06 ENCOUNTER — Ambulatory Visit (HOSPITAL_COMMUNITY): Payer: Medicare Other

## 2015-09-06 ENCOUNTER — Other Ambulatory Visit: Payer: Self-pay | Admitting: Student in an Organized Health Care Education/Training Program

## 2015-09-06 DIAGNOSIS — R845 Abnormal microbiological findings in specimens from respiratory organs and thorax: Secondary | ICD-10-CM

## 2015-09-06 DIAGNOSIS — Z48813 Encounter for surgical aftercare following surgery on the respiratory system: Secondary | ICD-10-CM

## 2015-09-06 DIAGNOSIS — Z79899 Other long term (current) drug therapy: Secondary | ICD-10-CM

## 2015-09-06 DIAGNOSIS — J9811 Atelectasis: Secondary | ICD-10-CM

## 2015-09-06 DIAGNOSIS — I493 Ventricular premature depolarization: Secondary | ICD-10-CM

## 2015-09-06 DIAGNOSIS — Z942 Lung transplant status: Secondary | ICD-10-CM

## 2015-09-06 LAB — GLUCOSE POC, ~~LOC~~
Glucose (POC): 121 mg/dL (ref 62–125)
Glucose (POC): 126 mg/dL — ABNORMAL HIGH (ref 62–125)
Glucose (POC): 126 mg/dL — ABNORMAL HIGH (ref 62–125)
Glucose (POC): 127 mg/dL — ABNORMAL HIGH (ref 62–125)
Glucose (POC): 129 mg/dL — ABNORMAL HIGH (ref 62–125)
Glucose (POC): 142 mg/dL — ABNORMAL HIGH (ref 62–125)
Glucose (POC): 143 mg/dL — ABNORMAL HIGH (ref 62–125)
Glucose (POC): 153 mg/dL — ABNORMAL HIGH (ref 62–125)
Glucose (POC): 156 mg/dL — ABNORMAL HIGH (ref 62–125)
Glucose (POC): 161 mg/dL — ABNORMAL HIGH (ref 62–125)
Glucose (POC): 161 mg/dL — ABNORMAL HIGH (ref 62–125)
Glucose (POC): 167 mg/dL — ABNORMAL HIGH (ref 62–125)
Glucose (POC): 170 mg/dL — ABNORMAL HIGH (ref 62–125)
Glucose (POC): 183 mg/dL — ABNORMAL HIGH (ref 62–125)
Glucose (POC): 238 mg/dL — ABNORMAL HIGH (ref 62–125)
Glucose (POC): 254 mg/dL — ABNORMAL HIGH (ref 62–125)
Glucose (POC): 303 mg/dL — ABNORMAL HIGH (ref 62–125)
Glucose (POC): 56 mg/dL — ABNORMAL LOW (ref 62–125)
Glucose (POC): 91 mg/dL (ref 62–125)
Glucose (POC): 97 mg/dL (ref 62–125)
Glucose (POC): 98 mg/dL (ref 62–125)

## 2015-09-06 LAB — BASIC METABOLIC PANEL
Anion Gap: 8 (ref 4–12)
Calcium: 7.3 mg/dL — ABNORMAL LOW (ref 8.9–10.2)
Carbon Dioxide, Total: 23 meq/L (ref 22–32)
Chloride: 108 meq/L (ref 98–108)
Creatinine: 0.65 mg/dL (ref 0.51–1.18)
GFR, Calc, African American: >60 mL/min/{1.73_m2}
GFR, Calc, European American: >60 mL/min/{1.73_m2}
Glucose: 79 mg/dL (ref 62–125)
Potassium: 4.2 meq/L (ref 3.6–5.2)
Sodium: 139 meq/L (ref 135–145)
Urea Nitrogen: 16 mg/dL (ref 8–21)

## 2015-09-06 LAB — BLOOD C/S

## 2015-09-06 LAB — BLOOD GAS, ARTERIAL, W/ LACT
Base Deficit Blood, ART: 3.3 meq/L — ABNORMAL HIGH (ref 0.0–2.0)
Base Excess Blood, ART: 1.8 meq/L (ref 0.0–3.0)
Base Excess Blood, ART: 1.5 meq/L (ref 0.0–3.0)
Bicarbonate: 23 meq/L (ref 22–26)
Bicarbonate: 26 meq/L (ref 22–26)
Bicarbonate: 26 meq/L (ref 22–26)
Fi (O2) [%]: 40 %
Fi (O2) [%]: 40 %
Fi (O2) [%]: 40 %
L Lactate (Direct), ART WB: 4.6 mmol/L — ABNORMAL HIGH (ref 0.4–1.0)
L Lactate (Direct), ART WB: 2.6 mmol/L — ABNORMAL HIGH (ref 0.4–1.0)
L Lactate (Direct), ART WB: 2.6 mmol/L — ABNORMAL HIGH (ref 0.4–1.0)
O2 Saturation: 99 % (ref 95–99)
O2 Saturation: 96 % (ref 95–99)
O2 Saturation: 100 % — ABNORMAL HIGH (ref 95–99)
PEEP (Pos End Expir Pres): 5
PEEP (Pos End Expir Pres): 5
pCO2, ART: 43 mmHg (ref 33–48)
pCO2, ART: 35 mmHg (ref 33–48)
pCO2, ART: 39 mmHg (ref 33–48)
pH, ART: 7.32 — ABNORMAL LOW (ref 7.35–7.45)
pH, ART: 7.47 — ABNORMAL HIGH (ref 7.35–7.45)
pH, ART: 7.43 (ref 7.35–7.45)
pO2: 114 mmHg — ABNORMAL HIGH (ref 63–87)
pO2: 71 mmHg (ref 63–87)
pO2: 170 mmHg — ABNORMAL HIGH (ref 63–87)

## 2015-09-06 LAB — VITAMIN D (25 HYDROXY)
Vit D (25_Hydroxy) Total: 27.6 ng/mL (ref 20.1–50.0)
Vitamin D2 (25_Hydroxy): <1 ng/mL
Vitamin D3 (25_Hydroxy): 27.6 ng/mL

## 2015-09-06 LAB — CBC, DIFF
% Basophils: 0 %
% Eosinophils: 0 %
% Immature Granulocytes: 1 %
% Lymphocytes: 4 %
% Monocytes: 0 %
% Neutrophils: 95 %
% Nucleated RBC: 0 %
Absolute Eosinophil Count: 0 10*3/uL (ref 0.00–0.50)
Absolute Lymphocyte Count: 0.67 10*3/uL — ABNORMAL LOW (ref 1.00–4.80)
Basophils: 0 10*3/uL (ref 0.00–0.20)
Hematocrit: 33 % — ABNORMAL LOW (ref 38–50)
Hemoglobin: 11.3 g/dL — ABNORMAL LOW (ref 13.0–18.0)
Immature Granulocytes: 0.17 10*3/uL — ABNORMAL HIGH (ref 0.00–0.05)
MCH: 29.6 pg (ref 27.3–33.6)
MCHC: 33.8 g/dL (ref 32.2–36.5)
MCV: 87 fL (ref 81–98)
Monocytes: 0 10*3/uL (ref 0.00–0.80)
Neutrophils: 15.91 10*3/uL — ABNORMAL HIGH (ref 1.80–7.00)
Nucleated RBC: 0 10*3/uL
Platelet Count: 139 10*3/uL — ABNORMAL LOW (ref 150–400)
RBC: 3.82 10*6/uL — ABNORMAL LOW (ref 4.40–5.60)
RDW-CV: 15.4 % — ABNORMAL HIGH (ref 11.6–14.4)
WBC: 16.75 10*3/uL — ABNORMAL HIGH (ref 4.3–10.0)

## 2015-09-06 LAB — COMPREHENSIVE METABOLIC PANEL
ALT (GPT): 23 U/L (ref 10–48)
AST (GOT): 70 U/L — ABNORMAL HIGH (ref 9–38)
Albumin: 2.1 g/dL — ABNORMAL LOW (ref 3.5–5.2)
Alkaline Phosphatase (Total): 35 U/L — ABNORMAL LOW (ref 36–161)
Anion Gap: 11 (ref 4–12)
Bilirubin (Total): 1.8 mg/dL — ABNORMAL HIGH (ref 0.2–1.3)
Calcium: 7.4 mg/dL — ABNORMAL LOW (ref 8.9–10.2)
Carbon Dioxide, Total: 21 meq/L — ABNORMAL LOW (ref 22–32)
Chloride: 107 meq/L (ref 98–108)
Creatinine: 0.77 mg/dL (ref 0.51–1.18)
GFR, Calc, African American: >60 mL/min/{1.73_m2}
GFR, Calc, European American: >60 mL/min/{1.73_m2}
Glucose: 232 mg/dL — ABNORMAL HIGH (ref 62–125)
Potassium: 3.9 meq/L (ref 3.6–5.2)
Protein (Total): 3.6 g/dL — ABNORMAL LOW (ref 6.0–8.2)
Sodium: 139 meq/L (ref 135–145)
Urea Nitrogen: 16 mg/dL (ref 8–21)

## 2015-09-06 LAB — BLOOD GAS, ARTERIAL (NO ELECTROLYTES)
Base Excess Blood, ART: 1.4 meq/L (ref 0.0–3.0)
Bicarbonate: 26 meq/L (ref 22–26)
Fi (O2) [%]: 40 %
O2 Saturation: 99 % (ref 95–99)
PEEP (Pos End Expir Pres): 5
pCO2, ART: 39 mmHg (ref 33–48)
pH, ART: 7.43 (ref 7.35–7.45)
pO2: 107 mmHg — ABNORMAL HIGH (ref 63–87)

## 2015-09-06 LAB — COOXIMETRY PANEL, MIXED VEN
Carboxyhemoglobin, Mixed VEN: 1.1 %
Carboxyhemoglobin, Mixed VEN: 1.1 %
Hemoglobin, MIXED VEN: 11.8 g/dL — ABNORMAL LOW (ref 13.0–18.0)
Hemoglobin, MIXED VEN: 11.6 g/dL — ABNORMAL LOW (ref 13.0–18.0)
Methemoglobin, Mixed VEN: 0.5 % (ref <3.0)
Methemoglobin, Mixed VEN: 0.5 % (ref <3.0)
O2 Content, Mixed VEN: 10.1 VOL%
O2 Content, Mixed VEN: 10.4 VOL%
O2 Sat (Frac.), Mixed VEN: 61 %
O2 Sat (Frac.), Mixed VEN: 64 %
O2 Sat (Func.), Mixed VEN: 62 %
O2 Sat (Func.), Mixed VEN: 65 %

## 2015-09-06 LAB — HEMOGLOBIN A1C, HPLC: Hemoglobin A1C: 5.5 % (ref 4.0–6.0)

## 2015-09-06 LAB — CALCIUM, (REFLEXIVE IONIZED)

## 2015-09-06 LAB — CALCIUM: Calcium: 8.3 mg/dL — ABNORMAL LOW (ref 8.9–10.2)

## 2015-09-06 LAB — MAGNESIUM
Magnesium: 2.1 mg/dL (ref 1.8–2.4)
Magnesium: 1.9 mg/dL (ref 1.8–2.4)

## 2015-09-06 LAB — PROTHROMBIN & PTT
Partial Thromboplastin Time: 39 s — ABNORMAL HIGH (ref 22–35)
Prothrombin INR: 1.5 — ABNORMAL HIGH (ref 0.8–1.3)
Prothrombin Time Patient: 17.6 s — ABNORMAL HIGH (ref 10.7–15.6)

## 2015-09-06 LAB — CBC (HEMOGRAM)
Hematocrit: 33 % — ABNORMAL LOW (ref 38–50)
Hemoglobin: 11.2 g/dL — ABNORMAL LOW (ref 13.0–18.0)
MCH: 29.3 pg (ref 27.3–33.6)
MCHC: 33.8 g/dL (ref 32.2–36.5)
MCV: 87 fL (ref 81–98)
Platelet Count: 125 10*3/uL — ABNORMAL LOW (ref 150–400)
RBC: 3.82 10*6/uL — ABNORMAL LOW (ref 4.40–5.60)
RDW-CV: 15.4 % — ABNORMAL HIGH (ref 11.6–14.4)
WBC: 14.75 10*3/uL — ABNORMAL HIGH (ref 4.3–10.0)

## 2015-09-06 LAB — PHOSPHATE: Phosphate: 4 mg/dL (ref 2.5–4.5)

## 2015-09-06 LAB — CALCIUM (IONIZED), WB: Calcium (Ionized): 1.12 mmol/L — ABNORMAL LOW (ref 1.18–1.38)

## 2015-09-06 LAB — LAB ADD ON ORDER

## 2015-09-06 LAB — IONIZED CALCIUM, PLASMA: Ionized Calcium, Plasma: 1.17 mmol/L — ABNORMAL LOW (ref 1.18–1.38)

## 2015-09-06 LAB — L_LACTATE, ARTERIAL WHOLE BLOOD: L Lactate (Direct), ART WB: 3 mmol/L — ABNORMAL HIGH (ref 0.4–1.0)

## 2015-09-06 LAB — POTASSIUM, SERUM
Potassium: 4.2 meq/L (ref 3.6–5.2)
Potassium: 4.1 meq/L (ref 3.6–5.2)

## 2015-09-06 LAB — HEMATOCRIT
Hematocrit: 33 % — ABNORMAL LOW (ref 38–50)
Hematocrit: 30 % — ABNORMAL LOW (ref 38–50)

## 2015-09-07 ENCOUNTER — Other Ambulatory Visit: Payer: Self-pay | Admitting: Nurse Practitioner

## 2015-09-07 DIAGNOSIS — J984 Other disorders of lung: Secondary | ICD-10-CM

## 2015-09-07 DIAGNOSIS — Z95828 Presence of other vascular implants and grafts: Secondary | ICD-10-CM

## 2015-09-07 LAB — CBC, DIFF
% Basophils: 0 %
% Eosinophils: 0 %
% Immature Granulocytes: 1 %
% Lymphocytes: 3 %
% Monocytes: 5 %
% Neutrophils: 91 %
% Nucleated RBC: 0 %
Absolute Eosinophil Count: 0 10*3/uL (ref 0.00–0.50)
Absolute Lymphocyte Count: 0.58 10*3/uL — ABNORMAL LOW (ref 1.00–4.80)
Basophils: 0.01 10*3/uL (ref 0.00–0.20)
Hematocrit: 32 % — ABNORMAL LOW (ref 38–50)
Hemoglobin: 11 g/dL — ABNORMAL LOW (ref 13.0–18.0)
Immature Granulocytes: 0.11 10*3/uL — ABNORMAL HIGH (ref 0.00–0.05)
MCH: 29.9 pg (ref 27.3–33.6)
MCHC: 34.4 g/dL (ref 32.2–36.5)
MCV: 87 fL (ref 81–98)
Monocytes: 0.8 10*3/uL (ref 0.00–0.80)
Neutrophils: 16.07 10*3/uL — ABNORMAL HIGH (ref 1.80–7.00)
Nucleated RBC: 0 10*3/uL
Platelet Count: 124 10*3/uL — ABNORMAL LOW (ref 150–400)
RBC: 3.68 10*6/uL — ABNORMAL LOW (ref 4.40–5.60)
RDW-CV: 15.1 % — ABNORMAL HIGH (ref 11.6–14.4)
WBC: 17.57 10*3/uL — ABNORMAL HIGH (ref 4.3–10.0)

## 2015-09-07 LAB — BMP WITH REFLEXIVE IONIZED CA
Anion Gap: 9 (ref 4–12)
Calcium: 7.4 mg/dL — ABNORMAL LOW (ref 8.9–10.2)
Carbon Dioxide, Total: 23 meq/L (ref 22–32)
Chloride: 105 meq/L (ref 98–108)
Creatinine: 0.77 mg/dL (ref 0.51–1.18)
GFR, Calc, African American: >60 mL/min/{1.73_m2}
GFR, Calc, European American: >60 mL/min/{1.73_m2}
Glucose: 152 mg/dL — ABNORMAL HIGH (ref 62–125)
Potassium: 4.5 meq/L (ref 3.6–5.2)
Sodium: 137 meq/L (ref 135–145)
Urea Nitrogen: 27 mg/dL — ABNORMAL HIGH (ref 8–21)

## 2015-09-07 LAB — PROTHROMBIN & PTT
Partial Thromboplastin Time: 41 s — ABNORMAL HIGH (ref 22–35)
Prothrombin INR: 1.4 — ABNORMAL HIGH (ref 0.8–1.3)
Prothrombin Time Patient: 16.5 s — ABNORMAL HIGH (ref 10.7–15.6)

## 2015-09-07 LAB — COMPREHENSIVE METABOLIC PANEL
ALT (GPT): 29 U/L (ref 10–48)
AST (GOT): 82 U/L — ABNORMAL HIGH (ref 9–38)
Albumin: 2.3 g/dL — ABNORMAL LOW (ref 3.5–5.2)
Alkaline Phosphatase (Total): 41 U/L (ref 36–161)
Anion Gap: 8 (ref 4–12)
Bilirubin (Total): 0.6 mg/dL (ref 0.2–1.3)
Calcium: 7.5 mg/dL — ABNORMAL LOW (ref 8.9–10.2)
Carbon Dioxide, Total: 24 meq/L (ref 22–32)
Chloride: 105 meq/L (ref 98–108)
Creatinine: 0.69 mg/dL (ref 0.51–1.18)
GFR, Calc, African American: >60 mL/min/{1.73_m2}
GFR, Calc, European American: >60 mL/min/{1.73_m2}
Glucose: 148 mg/dL — ABNORMAL HIGH (ref 62–125)
Potassium: 4.6 meq/L (ref 3.6–5.2)
Protein (Total): 4.3 g/dL — ABNORMAL LOW (ref 6.0–8.2)
Sodium: 137 meq/L (ref 135–145)
Urea Nitrogen: 22 mg/dL — ABNORMAL HIGH (ref 8–21)

## 2015-09-07 LAB — IONIZED CALCIUM, PLASMA: Ionized Calcium, Plasma: 1.11 mmol/L — ABNORMAL LOW (ref 1.18–1.38)

## 2015-09-07 LAB — GLUCOSE POC, ~~LOC~~
Glucose (POC): 100 mg/dL (ref 62–125)
Glucose (POC): 102 mg/dL (ref 62–125)
Glucose (POC): 105 mg/dL (ref 62–125)
Glucose (POC): 105 mg/dL (ref 62–125)
Glucose (POC): 106 mg/dL (ref 62–125)
Glucose (POC): 106 mg/dL (ref 62–125)
Glucose (POC): 108 mg/dL (ref 62–125)
Glucose (POC): 113 mg/dL (ref 62–125)
Glucose (POC): 119 mg/dL (ref 62–125)
Glucose (POC): 126 mg/dL — ABNORMAL HIGH (ref 62–125)
Glucose (POC): 140 mg/dL — ABNORMAL HIGH (ref 62–125)
Glucose (POC): 149 mg/dL — ABNORMAL HIGH (ref 62–125)
Glucose (POC): 162 mg/dL — ABNORMAL HIGH (ref 62–125)
Glucose (POC): 164 mg/dL — ABNORMAL HIGH (ref 62–125)
Glucose (POC): 91 mg/dL (ref 62–125)

## 2015-09-07 LAB — BLOOD GAS, ARTERIAL (NO ELECTROLYTES)
Base Deficit Blood, ART: 0.8 meq/L (ref 0.0–2.0)
Base Deficit Blood, ART: 0.7 meq/L (ref 0.0–2.0)
Base Excess Blood, ART: 1.1 meq/L (ref 0.0–3.0)
Bicarbonate: 25 meq/L (ref 22–26)
Bicarbonate: 23 meq/L (ref 22–26)
Bicarbonate: 26 meq/L (ref 22–26)
Fi (O2) [%]: 90 %
Fi (O2) [%]: 50 %
O2 Saturation: 100 % — ABNORMAL HIGH (ref 95–99)
O2 Saturation: 96 % (ref 95–99)
O2 Saturation: 98 % (ref 95–99)
PEEP (Pos End Expir Pres): 5
pCO2, ART: 44 mmHg (ref 33–48)
pCO2, ART: 34 mmHg (ref 33–48)
pCO2, ART: 39 mmHg (ref 33–48)
pH, ART: 7.35 (ref 7.35–7.45)
pH, ART: 7.44 (ref 7.35–7.45)
pH, ART: 7.43 (ref 7.35–7.45)
pO2: 171 mmHg — ABNORMAL HIGH (ref 63–87)
pO2: 72 mmHg (ref 63–87)
pO2: 100 mmHg — ABNORMAL HIGH (ref 63–87)

## 2015-09-07 LAB — PROTHROMBIN TIME
Prothrombin INR: 1.5 — ABNORMAL HIGH (ref 0.8–1.3)
Prothrombin Time Patient: 17.3 s — ABNORMAL HIGH (ref 10.7–15.6)

## 2015-09-07 LAB — CALCIUM (IONIZED), WB: Calcium (Ionized): 1.1 mmol/L — ABNORMAL LOW (ref 1.18–1.38)

## 2015-09-07 LAB — PLATELET COUNT: Platelet Count: 128 10*3/uL — ABNORMAL LOW (ref 150–400)

## 2015-09-07 LAB — R/O MRSA

## 2015-09-07 LAB — LAB ORDER, MICROBIOLOGY

## 2015-09-07 LAB — MAGNESIUM
Magnesium: 2.3 mg/dL (ref 1.8–2.4)
Magnesium: 2.1 mg/dL (ref 1.8–2.4)

## 2015-09-07 LAB — PHOSPHATE
Phosphate: 4.3 mg/dL (ref 2.5–4.5)
Phosphate: 3.5 mg/dL (ref 2.5–4.5)

## 2015-09-07 LAB — PARTIAL THROMBOPLASTIN TIME: Partial Thromboplastin Time: 28 s (ref 22–35)

## 2015-09-07 LAB — L_LACTATE, ARTERIAL WHOLE BLOOD: L Lactate (Direct), ART WB: 3.2 mmol/L — ABNORMAL HIGH (ref 0.4–1.0)

## 2015-09-08 ENCOUNTER — Other Ambulatory Visit: Payer: Self-pay | Admitting: Nurse Practitioner

## 2015-09-08 LAB — PARTIAL THROMBOPLASTIN TIME: Partial Thromboplastin Time: 35 s (ref 22–35)

## 2015-09-08 LAB — CBC, DIFF
% Basophils: 0 %
% Eosinophils: 0 %
% Immature Granulocytes: 0 %
% Lymphocytes: 4 %
% Monocytes: 4 %
% Neutrophils: 92 %
% Nucleated RBC: 0 %
Absolute Eosinophil Count: 0 10*3/uL (ref 0.00–0.50)
Absolute Lymphocyte Count: 0.52 10*3/uL — ABNORMAL LOW (ref 1.00–4.80)
Basophils: 0 10*3/uL (ref 0.00–0.20)
Hematocrit: 28 % — ABNORMAL LOW (ref 38–50)
Hemoglobin: 9.5 g/dL — ABNORMAL LOW (ref 13.0–18.0)
Immature Granulocytes: 0 10*3/uL (ref 0.00–0.05)
MCH: 28.9 pg (ref 27.3–33.6)
MCHC: 33.8 g/dL (ref 32.2–36.5)
MCV: 85 fL (ref 81–98)
Monocytes: 0.52 10*3/uL (ref 0.00–0.80)
Neutrophils: 11.95 10*3/uL — ABNORMAL HIGH (ref 1.80–7.00)
Nucleated RBC: 0 10*3/uL
Platelet Count: 128 10*3/uL — ABNORMAL LOW (ref 150–400)
RBC: 3.29 10*6/uL — ABNORMAL LOW (ref 4.40–5.60)
RDW-CV: 14.3 % (ref 11.6–14.4)
WBC: 12.99 10*3/uL — ABNORMAL HIGH (ref 4.3–10.0)

## 2015-09-08 LAB — COMPREHENSIVE METABOLIC PANEL
ALT (GPT): 32 U/L (ref 10–48)
AST (GOT): 74 U/L — ABNORMAL HIGH (ref 9–38)
Albumin: 2.2 g/dL — ABNORMAL LOW (ref 3.5–5.2)
Alkaline Phosphatase (Total): 40 U/L (ref 36–161)
Anion Gap: 6 (ref 4–12)
Bilirubin (Total): 0.7 mg/dL (ref 0.2–1.3)
Calcium: 7.5 mg/dL — ABNORMAL LOW (ref 8.9–10.2)
Carbon Dioxide, Total: 26 meq/L (ref 22–32)
Chloride: 100 meq/L (ref 98–108)
Creatinine: 0.91 mg/dL (ref 0.51–1.18)
GFR, Calc, African American: >60 mL/min/{1.73_m2}
GFR, Calc, European American: >60 mL/min/{1.73_m2}
Glucose: 125 mg/dL (ref 62–125)
Potassium: 4.3 meq/L (ref 3.6–5.2)
Protein (Total): 4.4 g/dL — ABNORMAL LOW (ref 6.0–8.2)
Sodium: 132 meq/L — ABNORMAL LOW (ref 135–145)
Urea Nitrogen: 38 mg/dL — ABNORMAL HIGH (ref 8–21)

## 2015-09-08 LAB — WOUND C/S W/GRAM (ANAEROBIC)

## 2015-09-08 LAB — GLUCOSE POC, ~~LOC~~
Glucose (POC): 105 mg/dL (ref 62–125)
Glucose (POC): 121 mg/dL (ref 62–125)
Glucose (POC): 109 mg/dL (ref 62–125)
Glucose (POC): 114 mg/dL (ref 62–125)
Glucose (POC): 126 mg/dL — ABNORMAL HIGH (ref 62–125)
Glucose (POC): 105 mg/dL (ref 62–125)

## 2015-09-08 LAB — BILIRUBIN (DIRECT): Bilirubin (Direct): 0.2 mg/dL (ref 0.0–0.3)

## 2015-09-08 LAB — VANCOMYCIN, TROUGH LEVEL: Vancomycin, Trough Level: 13.9 ug/mL (ref 5.0–20.0)

## 2015-09-08 LAB — MAGNESIUM: Magnesium: 2.5 mg/dL — ABNORMAL HIGH (ref 1.8–2.4)

## 2015-09-08 LAB — PHOSPHATE: Phosphate: 3.1 mg/dL (ref 2.5–4.5)

## 2015-09-08 LAB — CALCIUM (IONIZED), WB: Calcium (Ionized): 1.08 mmol/L — ABNORMAL LOW (ref 1.18–1.38)

## 2015-09-09 ENCOUNTER — Other Ambulatory Visit: Payer: Self-pay | Admitting: Physician Assistant

## 2015-09-09 DIAGNOSIS — Z792 Long term (current) use of antibiotics: Secondary | ICD-10-CM

## 2015-09-09 DIAGNOSIS — R1310 Dysphagia, unspecified: Secondary | ICD-10-CM

## 2015-09-09 DIAGNOSIS — B9561 Methicillin susceptible Staphylococcus aureus infection as the cause of diseases classified elsewhere: Secondary | ICD-10-CM

## 2015-09-09 DIAGNOSIS — Z5181 Encounter for therapeutic drug level monitoring: Secondary | ICD-10-CM

## 2015-09-09 LAB — CBC, DIFF
% Basophils: 0 %
% Eosinophils: 0 %
% Immature Granulocytes: 0 %
% Lymphocytes: 4 %
% Monocytes: 1 %
% Neutrophils: 95 %
% Nucleated RBC: 1 %
Absolute Eosinophil Count: 0 10*3/uL (ref 0.00–0.50)
Absolute Lymphocyte Count: 0.46 10*3/uL — ABNORMAL LOW (ref 1.00–4.80)
Basophils: 0 10*3/uL (ref 0.00–0.20)
Hematocrit: 28 % — ABNORMAL LOW (ref 38–50)
Hemoglobin: 9.5 g/dL — ABNORMAL LOW (ref 13.0–18.0)
Immature Granulocytes: 0 10*3/uL (ref 0.00–0.05)
MCH: 29.1 pg (ref 27.3–33.6)
MCHC: 33.8 g/dL (ref 32.2–36.5)
MCV: 86 fL (ref 81–98)
Monocytes: 0.12 10*3/uL (ref 0.00–0.80)
Neutrophils: 11.04 10*3/uL — ABNORMAL HIGH (ref 1.80–7.00)
Nucleated RBC: 0.09 10*3/uL — ABNORMAL HIGH
Platelet Count: 126 10*3/uL — ABNORMAL LOW (ref 150–400)
RBC: 3.26 10*6/uL — ABNORMAL LOW (ref 4.40–5.60)
RDW-CV: 14.2 % (ref 11.6–14.4)
WBC: 11.62 10*3/uL — ABNORMAL HIGH (ref 4.3–10.0)

## 2015-09-09 LAB — LAB ADD ON ORDER

## 2015-09-09 LAB — COMPREHENSIVE METABOLIC PANEL
ALT (GPT): 35 U/L (ref 10–48)
AST (GOT): 58 U/L — ABNORMAL HIGH (ref 9–38)
Albumin: 2.3 g/dL — ABNORMAL LOW (ref 3.5–5.2)
Alkaline Phosphatase (Total): 50 U/L (ref 36–161)
Anion Gap: 7 (ref 4–12)
Bilirubin (Total): 0.7 mg/dL (ref 0.2–1.3)
Calcium: 8.1 mg/dL — ABNORMAL LOW (ref 8.9–10.2)
Carbon Dioxide, Total: 27 meq/L (ref 22–32)
Chloride: 101 meq/L (ref 98–108)
Creatinine: 0.88 mg/dL (ref 0.51–1.18)
GFR, Calc, African American: >60 mL/min/{1.73_m2}
GFR, Calc, European American: >60 mL/min/{1.73_m2}
Glucose: 109 mg/dL (ref 62–125)
Potassium: 4.3 meq/L (ref 3.6–5.2)
Protein (Total): 4.8 g/dL — ABNORMAL LOW (ref 6.0–8.2)
Sodium: 135 meq/L (ref 135–145)
Urea Nitrogen: 42 mg/dL — ABNORMAL HIGH (ref 8–21)

## 2015-09-09 LAB — CMV PCR QUANT: CMV Quant Result: NOT DETECTED [IU]/mL

## 2015-09-09 LAB — BASIC METABOLIC PANEL
Anion Gap: 5 (ref 4–12)
Calcium: 8.1 mg/dL — ABNORMAL LOW (ref 8.9–10.2)
Carbon Dioxide, Total: 29 meq/L (ref 22–32)
Chloride: 102 meq/L (ref 98–108)
Creatinine: 0.94 mg/dL (ref 0.51–1.18)
GFR, Calc, African American: >60 mL/min/{1.73_m2}
GFR, Calc, European American: >60 mL/min/{1.73_m2}
Glucose: 110 mg/dL (ref 62–125)
Potassium: 3.9 meq/L (ref 3.6–5.2)
Sodium: 136 meq/L (ref 135–145)
Urea Nitrogen: 36 mg/dL — ABNORMAL HIGH (ref 8–21)

## 2015-09-09 LAB — PARTIAL THROMBOPLASTIN TIME: Partial Thromboplastin Time: 28 s (ref 22–35)

## 2015-09-09 LAB — PHOSPHATE: Phosphate: 2.8 mg/dL (ref 2.5–4.5)

## 2015-09-09 LAB — GLUCOSE POC, ~~LOC~~
Glucose (POC): 91 mg/dL (ref 62–125)
Glucose (POC): 103 mg/dL (ref 62–125)
Glucose (POC): 122 mg/dL (ref 62–125)

## 2015-09-09 LAB — LYMPHOCYTE CROSSMATCH (SENDOUT)

## 2015-09-09 LAB — MAGNESIUM: Magnesium: 2.6 mg/dL — ABNORMAL HIGH (ref 1.8–2.4)

## 2015-09-10 ENCOUNTER — Other Ambulatory Visit: Payer: Self-pay | Admitting: Physician Assistant

## 2015-09-10 LAB — COMPREHENSIVE METABOLIC PANEL
ALT (GPT): 33 U/L (ref 10–48)
AST (GOT): 44 U/L — ABNORMAL HIGH (ref 9–38)
Albumin: 2.3 g/dL — ABNORMAL LOW (ref 3.5–5.2)
Alkaline Phosphatase (Total): 54 U/L (ref 36–161)
Anion Gap: 7 (ref 4–12)
Bilirubin (Total): 0.5 mg/dL (ref 0.2–1.3)
Calcium: 7.4 mg/dL — ABNORMAL LOW (ref 8.9–10.2)
Carbon Dioxide, Total: 28 meq/L (ref 22–32)
Chloride: 106 meq/L (ref 98–108)
Creatinine: 0.81 mg/dL (ref 0.51–1.18)
GFR, Calc, African American: >60 mL/min/{1.73_m2}
GFR, Calc, European American: >60 mL/min/{1.73_m2}
Glucose: 105 mg/dL (ref 62–125)
Potassium: 3.9 meq/L (ref 3.6–5.2)
Protein (Total): 4.4 g/dL — ABNORMAL LOW (ref 6.0–8.2)
Sodium: 141 meq/L (ref 135–145)
Urea Nitrogen: 35 mg/dL — ABNORMAL HIGH (ref 8–21)

## 2015-09-10 LAB — GLUCOSE POC, ~~LOC~~
Glucose (POC): 95 mg/dL (ref 62–125)
Glucose (POC): 98 mg/dL (ref 62–125)
Glucose (POC): 117 mg/dL (ref 62–125)
Glucose (POC): 115 mg/dL (ref 62–125)

## 2015-09-10 LAB — MAGNESIUM: Magnesium: 2.4 mg/dL (ref 1.8–2.4)

## 2015-09-10 LAB — CBC (HEMOGRAM)
Hematocrit: 25 % — ABNORMAL LOW (ref 38–50)
Hemoglobin: 8.2 g/dL — ABNORMAL LOW (ref 13.0–18.0)
MCH: 29.3 pg (ref 27.3–33.6)
MCHC: 33.2 g/dL (ref 32.2–36.5)
MCV: 88 fL (ref 81–98)
Platelet Count: 138 10*3/uL — ABNORMAL LOW (ref 150–400)
RBC: 2.8 10*6/uL — ABNORMAL LOW (ref 4.40–5.60)
RDW-CV: 14.3 % (ref 11.6–14.4)
WBC: 11.51 10*3/uL — ABNORMAL HIGH (ref 4.3–10.0)

## 2015-09-10 LAB — PHOSPHATE: Phosphate: 2.1 mg/dL — ABNORMAL LOW (ref 2.5–4.5)

## 2015-09-10 LAB — PARTIAL THROMBOPLASTIN TIME: Partial Thromboplastin Time: 32 s (ref 22–35)

## 2015-09-10 LAB — CALCIUM (IONIZED), WB: Calcium (Ionized): 1.1 mmol/L — ABNORMAL LOW (ref 1.18–1.38)

## 2015-09-10 LAB — POTASSIUM, SERUM: Potassium: 4.3 meq/L (ref 3.6–5.2)

## 2015-09-11 ENCOUNTER — Other Ambulatory Visit: Payer: Self-pay | Admitting: Surgery

## 2015-09-11 ENCOUNTER — Other Ambulatory Visit: Payer: Self-pay | Admitting: Student in an Organized Health Care Education/Training Program

## 2015-09-11 DIAGNOSIS — R918 Other nonspecific abnormal finding of lung field: Secondary | ICD-10-CM

## 2015-09-11 DIAGNOSIS — J9383 Other pneumothorax: Secondary | ICD-10-CM

## 2015-09-11 DIAGNOSIS — Z978 Presence of other specified devices: Secondary | ICD-10-CM

## 2015-09-11 LAB — PHOSPHATE: Phosphate: 1.7 mg/dL — ABNORMAL LOW (ref 2.5–4.5)

## 2015-09-11 LAB — COMPREHENSIVE METABOLIC PANEL
ALT (GPT): 26 U/L (ref 10–48)
AST (GOT): 36 U/L (ref 9–38)
Albumin: 2.3 g/dL — ABNORMAL LOW (ref 3.5–5.2)
Alkaline Phosphatase (Total): 58 U/L (ref 36–161)
Anion Gap: 7 (ref 4–12)
Bilirubin (Total): 0.6 mg/dL (ref 0.2–1.3)
Calcium: 7.5 mg/dL — ABNORMAL LOW (ref 8.9–10.2)
Carbon Dioxide, Total: 27 meq/L (ref 22–32)
Chloride: 108 meq/L (ref 98–108)
Creatinine: 0.63 mg/dL (ref 0.51–1.18)
GFR, Calc, African American: >60 mL/min/{1.73_m2}
GFR, Calc, European American: >60 mL/min/{1.73_m2}
Glucose: 89 mg/dL (ref 62–125)
Potassium: 3.9 meq/L (ref 3.6–5.2)
Protein (Total): 4.3 g/dL — ABNORMAL LOW (ref 6.0–8.2)
Sodium: 142 meq/L (ref 135–145)
Urea Nitrogen: 23 mg/dL — ABNORMAL HIGH (ref 8–21)

## 2015-09-11 LAB — CBC (HEMOGRAM)
Hematocrit: 27 % — ABNORMAL LOW (ref 38–50)
Hemoglobin: 8.7 g/dL — ABNORMAL LOW (ref 13.0–18.0)
MCH: 29.6 pg (ref 27.3–33.6)
MCHC: 32.7 g/dL (ref 32.2–36.5)
MCV: 91 fL (ref 81–98)
Platelet Count: 161 10*3/uL (ref 150–400)
RBC: 2.94 10*6/uL — ABNORMAL LOW (ref 4.40–5.60)
RDW-CV: 14.5 % — ABNORMAL HIGH (ref 11.6–14.4)
WBC: 12.92 10*3/uL — ABNORMAL HIGH (ref 4.3–10.0)

## 2015-09-11 LAB — GLUCOSE POC, ~~LOC~~
Glucose (POC): 86 mg/dL (ref 62–125)
Glucose (POC): 154 mg/dL — ABNORMAL HIGH (ref 62–125)
Glucose (POC): 104 mg/dL (ref 62–125)
Glucose (POC): 125 mg/dL (ref 62–125)

## 2015-09-11 LAB — TACROLIMUS BY LCMSMS: Tacrolimus By LCMSMS: <1 ng/mL

## 2015-09-11 LAB — BLOOD C/S, QUANTITATIVE
Colony Count: <1
Culture: NO GROWTH

## 2015-09-11 LAB — MAGNESIUM: Magnesium: 2.2 mg/dL (ref 1.8–2.4)

## 2015-09-11 LAB — PARTIAL THROMBOPLASTIN TIME: Partial Thromboplastin Time: 29 s (ref 22–35)

## 2015-09-12 ENCOUNTER — Other Ambulatory Visit: Payer: Self-pay | Admitting: Student in an Organized Health Care Education/Training Program

## 2015-09-12 LAB — COMPREHENSIVE METABOLIC PANEL
ALT (GPT): 27 U/L (ref 10–48)
AST (GOT): 30 U/L (ref 9–38)
Albumin: 2.2 g/dL — ABNORMAL LOW (ref 3.5–5.2)
Alkaline Phosphatase (Total): 64 U/L (ref 36–161)
Anion Gap: 4 (ref 4–12)
Bilirubin (Total): 0.6 mg/dL (ref 0.2–1.3)
Calcium: 7.6 mg/dL — ABNORMAL LOW (ref 8.9–10.2)
Carbon Dioxide, Total: 29 meq/L (ref 22–32)
Chloride: 110 meq/L — ABNORMAL HIGH (ref 98–108)
Creatinine: 0.58 mg/dL (ref 0.51–1.18)
GFR, Calc, African American: >60 mL/min/{1.73_m2}
GFR, Calc, European American: >60 mL/min/{1.73_m2}
Glucose: 98 mg/dL (ref 62–125)
Potassium: 3.8 meq/L (ref 3.6–5.2)
Protein (Total): 4.6 g/dL — ABNORMAL LOW (ref 6.0–8.2)
Sodium: 143 meq/L (ref 135–145)
Urea Nitrogen: 20 mg/dL (ref 8–21)

## 2015-09-12 LAB — MAGNESIUM: Magnesium: 1.8 mg/dL (ref 1.8–2.4)

## 2015-09-12 LAB — CBC (HEMOGRAM)
Hematocrit: 26 % — ABNORMAL LOW (ref 38–50)
Hemoglobin: 8.4 g/dL — ABNORMAL LOW (ref 13.0–18.0)
MCH: 29.6 pg (ref 27.3–33.6)
MCHC: 32.3 g/dL (ref 32.2–36.5)
MCV: 92 fL (ref 81–98)
Platelet Count: 180 10*3/uL (ref 150–400)
RBC: 2.84 10*6/uL — ABNORMAL LOW (ref 4.40–5.60)
RDW-CV: 14.6 % — ABNORMAL HIGH (ref 11.6–14.4)
WBC: 13.15 10*3/uL — ABNORMAL HIGH (ref 4.3–10.0)

## 2015-09-12 LAB — PARTIAL THROMBOPLASTIN TIME: Partial Thromboplastin Time: 28 s (ref 22–35)

## 2015-09-12 LAB — PHOSPHATE: Phosphate: 3.3 mg/dL (ref 2.5–4.5)

## 2015-09-12 LAB — GLUCOSE POC, ~~LOC~~
Glucose (POC): 123 mg/dL (ref 62–125)
Glucose (POC): 164 mg/dL — ABNORMAL HIGH (ref 62–125)
Glucose (POC): 172 mg/dL — ABNORMAL HIGH (ref 62–125)

## 2015-09-12 LAB — TACROLIMUS BY LCMSMS: Tacrolimus By LCMSMS: <1 ng/mL

## 2015-09-13 ENCOUNTER — Other Ambulatory Visit: Payer: Self-pay | Admitting: Student in an Organized Health Care Education/Training Program

## 2015-09-13 DIAGNOSIS — I495 Sick sinus syndrome: Secondary | ICD-10-CM

## 2015-09-13 DIAGNOSIS — J811 Chronic pulmonary edema: Secondary | ICD-10-CM

## 2015-09-13 DIAGNOSIS — F05 Delirium due to known physiological condition: Secondary | ICD-10-CM

## 2015-09-13 LAB — GLUCOSE POC, ~~LOC~~
Glucose (POC): 96 mg/dL (ref 62–125)
Glucose (POC): 126 mg/dL — ABNORMAL HIGH (ref 62–125)
Glucose (POC): 107 mg/dL (ref 62–125)
Glucose (POC): 123 mg/dL (ref 62–125)
Glucose (POC): 150 mg/dL — ABNORMAL HIGH (ref 62–125)

## 2015-09-13 LAB — PARTIAL THROMBOPLASTIN TIME: Partial Thromboplastin Time: 27 s (ref 22–35)

## 2015-09-13 LAB — COMPREHENSIVE METABOLIC PANEL
ALT (GPT): 48 U/L (ref 10–48)
AST (GOT): 46 U/L — ABNORMAL HIGH (ref 9–38)
Albumin: 2.3 g/dL — ABNORMAL LOW (ref 3.5–5.2)
Alkaline Phosphatase (Total): 61 U/L (ref 36–161)
Anion Gap: 8 (ref 4–12)
Bilirubin (Total): 0.5 mg/dL (ref 0.2–1.3)
Calcium: 7.9 mg/dL — ABNORMAL LOW (ref 8.9–10.2)
Carbon Dioxide, Total: 28 meq/L (ref 22–32)
Chloride: 107 meq/L (ref 98–108)
Creatinine: 0.55 mg/dL (ref 0.51–1.18)
GFR, Calc, African American: >60 mL/min/{1.73_m2}
GFR, Calc, European American: >60 mL/min/{1.73_m2}
Glucose: 96 mg/dL (ref 62–125)
Potassium: 3.7 meq/L (ref 3.6–5.2)
Protein (Total): 4.8 g/dL — ABNORMAL LOW (ref 6.0–8.2)
Sodium: 143 meq/L (ref 135–145)
Urea Nitrogen: 18 mg/dL (ref 8–21)

## 2015-09-13 LAB — TACROLIMUS BY LCMSMS: Tacrolimus By LCMSMS: 1.1 ng/mL

## 2015-09-13 LAB — MAGNESIUM: Magnesium: 1.9 mg/dL (ref 1.8–2.4)

## 2015-09-13 LAB — PHOSPHATE: Phosphate: 3.1 mg/dL (ref 2.5–4.5)

## 2015-09-14 ENCOUNTER — Other Ambulatory Visit: Payer: Self-pay | Admitting: Student in an Organized Health Care Education/Training Program

## 2015-09-14 DIAGNOSIS — J939 Pneumothorax, unspecified: Secondary | ICD-10-CM

## 2015-09-14 LAB — COMPREHENSIVE METABOLIC PANEL
ALT (GPT): 73 U/L — ABNORMAL HIGH (ref 10–48)
AST (GOT): 59 U/L — ABNORMAL HIGH (ref 9–38)
Albumin: 2.3 g/dL — ABNORMAL LOW (ref 3.5–5.2)
Alkaline Phosphatase (Total): 78 U/L (ref 36–161)
Anion Gap: 8 (ref 4–12)
Bilirubin (Total): 0.5 mg/dL (ref 0.2–1.3)
Calcium: 7.9 mg/dL — ABNORMAL LOW (ref 8.9–10.2)
Carbon Dioxide, Total: 25 meq/L (ref 22–32)
Chloride: 106 meq/L (ref 98–108)
Creatinine: 0.56 mg/dL (ref 0.51–1.18)
GFR, Calc, African American: >60 mL/min/{1.73_m2}
GFR, Calc, European American: >60 mL/min/{1.73_m2}
Glucose: 100 mg/dL (ref 62–125)
Potassium: 4.2 meq/L (ref 3.6–5.2)
Protein (Total): 4.3 g/dL — ABNORMAL LOW (ref 6.0–8.2)
Sodium: 139 meq/L (ref 135–145)
Urea Nitrogen: 17 mg/dL (ref 8–21)

## 2015-09-14 LAB — TACROLIMUS BY LCMSMS: Tacrolimus By LCMSMS: 1 ng/mL

## 2015-09-14 LAB — URINALYSIS WITH REFLEX CULTURE
Bacteria, URN: NONE SEEN
Bilirubin (Qual), URN: NEGATIVE
Epith Cells_Renal/Trans,URN: NEGATIVE /HPF
Epith Cells_Squamous, URN: NEGATIVE /LPF
Glucose Qual, URN: NEGATIVE mg/dL
Ketones, URN: NEGATIVE mg/dL
Leukocyte Esterase, URN: NEGATIVE
Nitrite, URN: NEGATIVE
Occult Blood, URN: NEGATIVE
Protein (Alb Semiquant), URN: NEGATIVE mg/dL
RBC, URN: NEGATIVE /HPF
Specific Gravity, URN: 1.021 g/mL (ref 1.002–1.027)
Urobilinogen, URN: 2 {Ehrlich'U} — AB
WBC, URN: NEGATIVE /HPF
pH, URN: 7 (ref 5.0–8.0)

## 2015-09-14 LAB — CBC (HEMOGRAM)
Hematocrit: 29 % — ABNORMAL LOW (ref 38–50)
Hemoglobin: 9.3 g/dL — ABNORMAL LOW (ref 13.0–18.0)
MCH: 29.7 pg (ref 27.3–33.6)
MCHC: 32.3 g/dL (ref 32.2–36.5)
MCV: 92 fL (ref 81–98)
Platelet Count: 279 10*3/uL (ref 150–400)
RBC: 3.13 10*6/uL — ABNORMAL LOW (ref 4.40–5.60)
RDW-CV: 15.5 % — ABNORMAL HIGH (ref 11.6–14.4)
WBC: 15.89 10*3/uL — ABNORMAL HIGH (ref 4.3–10.0)

## 2015-09-14 LAB — PHOSPHATE: Phosphate: 3 mg/dL (ref 2.5–4.5)

## 2015-09-14 LAB — PARTIAL THROMBOPLASTIN TIME: Partial Thromboplastin Time: 31 s (ref 22–35)

## 2015-09-14 LAB — MAGNESIUM: Magnesium: 1.7 mg/dL — ABNORMAL LOW (ref 1.8–2.4)

## 2015-09-14 LAB — BLOOD C/S (LINE), QUANTITATIVE
Colony Count: <1
Culture: NO GROWTH

## 2015-09-14 LAB — GLUCOSE POC, ~~LOC~~
Glucose (POC): 99 mg/dL (ref 62–125)
Glucose (POC): 115 mg/dL (ref 62–125)
Glucose (POC): 167 mg/dL — ABNORMAL HIGH (ref 62–125)
Glucose (POC): 114 mg/dL (ref 62–125)

## 2015-09-14 LAB — R/O MRSA

## 2015-09-15 ENCOUNTER — Other Ambulatory Visit: Payer: Self-pay | Admitting: Student in an Organized Health Care Education/Training Program

## 2015-09-15 DIAGNOSIS — Z4682 Encounter for fitting and adjustment of non-vascular catheter: Secondary | ICD-10-CM

## 2015-09-15 LAB — COMPREHENSIVE METABOLIC PANEL
ALT (GPT): 72 U/L — ABNORMAL HIGH (ref 10–48)
AST (GOT): 45 U/L — ABNORMAL HIGH (ref 9–38)
Albumin: 2.3 g/dL — ABNORMAL LOW (ref 3.5–5.2)
Alkaline Phosphatase (Total): 88 U/L (ref 36–161)
Anion Gap: 7 (ref 4–12)
Bilirubin (Total): 0.3 mg/dL (ref 0.2–1.3)
Calcium: 7.7 mg/dL — ABNORMAL LOW (ref 8.9–10.2)
Carbon Dioxide, Total: 26 meq/L (ref 22–32)
Chloride: 104 meq/L (ref 98–108)
Creatinine: 0.58 mg/dL (ref 0.51–1.18)
GFR, Calc, African American: >60 mL/min/{1.73_m2}
GFR, Calc, European American: >60 mL/min/{1.73_m2}
Glucose: 113 mg/dL (ref 62–125)
Potassium: 4.1 meq/L (ref 3.6–5.2)
Protein (Total): 4.7 g/dL — ABNORMAL LOW (ref 6.0–8.2)
Sodium: 137 meq/L (ref 135–145)
Urea Nitrogen: 18 mg/dL (ref 8–21)

## 2015-09-15 LAB — TACROLIMUS BY LCMSMS: Tacrolimus By LCMSMS: 2 ng/mL

## 2015-09-15 LAB — CBC (HEMOGRAM)
Hematocrit: 25 % — ABNORMAL LOW (ref 38–50)
Hemoglobin: 8.2 g/dL — ABNORMAL LOW (ref 13.0–18.0)
MCH: 29.7 pg (ref 27.3–33.6)
MCHC: 32.3 g/dL (ref 32.2–36.5)
MCV: 92 fL (ref 81–98)
Platelet Count: 325 10*3/uL (ref 150–400)
RBC: 2.76 10*6/uL — ABNORMAL LOW (ref 4.40–5.60)
RDW-CV: 15.3 % — ABNORMAL HIGH (ref 11.6–14.4)
WBC: 17.64 10*3/uL — ABNORMAL HIGH (ref 4.3–10.0)

## 2015-09-15 LAB — PHOSPHATE: Phosphate: 2.6 mg/dL (ref 2.5–4.5)

## 2015-09-15 LAB — GLUCOSE POC, ~~LOC~~
Glucose (POC): 104 mg/dL (ref 62–125)
Glucose (POC): 127 mg/dL — ABNORMAL HIGH (ref 62–125)
Glucose (POC): 137 mg/dL — ABNORMAL HIGH (ref 62–125)
Glucose (POC): 89 mg/dL (ref 62–125)

## 2015-09-15 LAB — PARTIAL THROMBOPLASTIN TIME: Partial Thromboplastin Time: 24 s (ref 22–35)

## 2015-09-15 LAB — MAGNESIUM: Magnesium: 1.8 mg/dL (ref 1.8–2.4)

## 2015-09-16 ENCOUNTER — Other Ambulatory Visit: Payer: Self-pay | Admitting: Student in an Organized Health Care Education/Training Program

## 2015-09-16 ENCOUNTER — Encounter (HOSPITAL_BASED_OUTPATIENT_CLINIC_OR_DEPARTMENT_OTHER): Payer: Self-pay | Admitting: Internal Medicine

## 2015-09-16 DIAGNOSIS — J982 Interstitial emphysema: Secondary | ICD-10-CM

## 2015-09-16 LAB — COMPREHENSIVE METABOLIC PANEL
ALT (GPT): 69 U/L — ABNORMAL HIGH (ref 10–48)
AST (GOT): 40 U/L — ABNORMAL HIGH (ref 9–38)
Albumin: 2.2 g/dL — ABNORMAL LOW (ref 3.5–5.2)
Alkaline Phosphatase (Total): 82 U/L (ref 36–161)
Anion Gap: 7 (ref 4–12)
Bilirubin (Total): 0.4 mg/dL (ref 0.2–1.3)
Calcium: 7.7 mg/dL — ABNORMAL LOW (ref 8.9–10.2)
Carbon Dioxide, Total: 26 meq/L (ref 22–32)
Chloride: 102 meq/L (ref 98–108)
Creatinine: 0.56 mg/dL (ref 0.51–1.18)
GFR, Calc, African American: >60 mL/min/{1.73_m2}
GFR, Calc, European American: >60 mL/min/{1.73_m2}
Glucose: 96 mg/dL (ref 62–125)
Potassium: 4.2 meq/L (ref 3.6–5.2)
Protein (Total): 4.7 g/dL — ABNORMAL LOW (ref 6.0–8.2)
Sodium: 135 meq/L (ref 135–145)
Urea Nitrogen: 14 mg/dL (ref 8–21)

## 2015-09-16 LAB — GLUCOSE POC, ~~LOC~~: Glucose (POC): 93 mg/dL (ref 62–125)

## 2015-09-16 LAB — CBC (HEMOGRAM)
Hematocrit: 25 % — ABNORMAL LOW (ref 38–50)
Hemoglobin: 8.3 g/dL — ABNORMAL LOW (ref 13.0–18.0)
MCH: 30.2 pg (ref 27.3–33.6)
MCHC: 32.7 g/dL (ref 32.2–36.5)
MCV: 92 fL (ref 81–98)
Platelet Count: 351 10*3/uL (ref 150–400)
RBC: 2.75 10*6/uL — ABNORMAL LOW (ref 4.40–5.60)
RDW-CV: 15.3 % — ABNORMAL HIGH (ref 11.6–14.4)
WBC: 17.02 10*3/uL — ABNORMAL HIGH (ref 4.3–10.0)

## 2015-09-16 LAB — PHOSPHATE: Phosphate: 2.7 mg/dL (ref 2.5–4.5)

## 2015-09-16 LAB — CMV PCR QUANT: CMV Quant Result: NOT DETECTED [IU]/mL

## 2015-09-16 LAB — MAGNESIUM: Magnesium: 1.8 mg/dL (ref 1.8–2.4)

## 2015-09-16 LAB — TACROLIMUS BY LCMSMS: Tacrolimus By LCMSMS: 2.5 ng/mL

## 2015-09-16 LAB — PARTIAL THROMBOPLASTIN TIME: Partial Thromboplastin Time: 31 s (ref 22–35)

## 2015-09-17 ENCOUNTER — Other Ambulatory Visit (HOSPITAL_COMMUNITY): Payer: Self-pay

## 2015-09-17 ENCOUNTER — Other Ambulatory Visit: Payer: Self-pay | Admitting: Student in an Organized Health Care Education/Training Program

## 2015-09-17 LAB — CBC (HEMOGRAM)
Hematocrit: 25 % — ABNORMAL LOW (ref 38–50)
Hemoglobin: 8.2 g/dL — ABNORMAL LOW (ref 13.0–18.0)
MCH: 30 pg (ref 27.3–33.6)
MCHC: 32.7 g/dL (ref 32.2–36.5)
MCV: 92 fL (ref 81–98)
Platelet Count: 377 10*3/uL (ref 150–400)
RBC: 2.73 10*6/uL — ABNORMAL LOW (ref 4.40–5.60)
RDW-CV: 15.5 % — ABNORMAL HIGH (ref 11.6–14.4)
WBC: 15.55 10*3/uL — ABNORMAL HIGH (ref 4.3–10.0)

## 2015-09-17 LAB — PARTIAL THROMBOPLASTIN TIME: Partial Thromboplastin Time: 25 s (ref 22–35)

## 2015-09-17 LAB — COMPREHENSIVE METABOLIC PANEL
ALT (GPT): 57 U/L — ABNORMAL HIGH (ref 10–48)
AST (GOT): 25 U/L (ref 9–38)
Albumin: 2.3 g/dL — ABNORMAL LOW (ref 3.5–5.2)
Alkaline Phosphatase (Total): 78 U/L (ref 36–161)
Anion Gap: 9 (ref 4–12)
Bilirubin (Total): 0.3 mg/dL (ref 0.2–1.3)
Calcium: 7.8 mg/dL — ABNORMAL LOW (ref 8.9–10.2)
Carbon Dioxide, Total: 26 meq/L (ref 22–32)
Chloride: 101 meq/L (ref 98–108)
Creatinine: 0.64 mg/dL (ref 0.51–1.18)
GFR, Calc, African American: >60 mL/min/{1.73_m2}
GFR, Calc, European American: >60 mL/min/{1.73_m2}
Glucose: 96 mg/dL (ref 62–125)
Potassium: 4 meq/L (ref 3.6–5.2)
Protein (Total): 4.8 g/dL — ABNORMAL LOW (ref 6.0–8.2)
Sodium: 136 meq/L (ref 135–145)
Urea Nitrogen: 14 mg/dL (ref 8–21)

## 2015-09-17 LAB — MAGNESIUM: Magnesium: 1.8 mg/dL (ref 1.8–2.4)

## 2015-09-17 LAB — PHOSPHATE: Phosphate: 2.9 mg/dL (ref 2.5–4.5)

## 2015-09-17 LAB — TACROLIMUS BY LCMSMS: Tacrolimus By LCMSMS: 2.7 ng/mL

## 2015-09-18 ENCOUNTER — Other Ambulatory Visit: Payer: Self-pay | Admitting: Student in an Organized Health Care Education/Training Program

## 2015-09-18 LAB — COMPREHENSIVE METABOLIC PANEL
ALT (GPT): 57 U/L — ABNORMAL HIGH (ref 10–48)
AST (GOT): 27 U/L (ref 9–38)
Albumin: 2.5 g/dL — ABNORMAL LOW (ref 3.5–5.2)
Alkaline Phosphatase (Total): 74 U/L (ref 36–161)
Anion Gap: 6 (ref 4–12)
Bilirubin (Total): 0.3 mg/dL (ref 0.2–1.3)
Calcium: 8 mg/dL — ABNORMAL LOW (ref 8.9–10.2)
Carbon Dioxide, Total: 27 meq/L (ref 22–32)
Chloride: 100 meq/L (ref 98–108)
Creatinine: 0.67 mg/dL (ref 0.51–1.18)
GFR, Calc, African American: >60 mL/min/{1.73_m2}
GFR, Calc, European American: >60 mL/min/{1.73_m2}
Glucose: 99 mg/dL (ref 62–125)
Potassium: 4.1 meq/L (ref 3.6–5.2)
Protein (Total): 5 g/dL — ABNORMAL LOW (ref 6.0–8.2)
Sodium: 133 meq/L — ABNORMAL LOW (ref 135–145)
Urea Nitrogen: 17 mg/dL (ref 8–21)

## 2015-09-18 LAB — CBC (HEMOGRAM)
Hematocrit: 26 % — ABNORMAL LOW (ref 38–50)
Hemoglobin: 8.4 g/dL — ABNORMAL LOW (ref 13.0–18.0)
MCH: 30.1 pg (ref 27.3–33.6)
MCHC: 32.8 g/dL (ref 32.2–36.5)
MCV: 92 fL (ref 81–98)
Platelet Count: 383 10*3/uL (ref 150–400)
RBC: 2.79 10*6/uL — ABNORMAL LOW (ref 4.40–5.60)
RDW-CV: 15.8 % — ABNORMAL HIGH (ref 11.6–14.4)
WBC: 14.43 10*3/uL — ABNORMAL HIGH (ref 4.3–10.0)

## 2015-09-18 LAB — TACROLIMUS BY LCMSMS: Tacrolimus By LCMSMS: 2.3 ng/mL

## 2015-09-18 LAB — PARTIAL THROMBOPLASTIN TIME: Partial Thromboplastin Time: 29 s (ref 22–35)

## 2015-09-18 LAB — PHOSPHATE: Phosphate: 3 mg/dL (ref 2.5–4.5)

## 2015-09-18 LAB — MAGNESIUM: Magnesium: 1.8 mg/dL (ref 1.8–2.4)

## 2015-09-19 ENCOUNTER — Other Ambulatory Visit: Payer: Self-pay | Admitting: Student in an Organized Health Care Education/Training Program

## 2015-09-19 LAB — COMPREHENSIVE METABOLIC PANEL
ALT (GPT): 50 U/L — ABNORMAL HIGH (ref 10–48)
AST (GOT): 22 U/L (ref 9–38)
Albumin: 2.5 g/dL — ABNORMAL LOW (ref 3.5–5.2)
Alkaline Phosphatase (Total): 73 U/L (ref 36–161)
Anion Gap: 5 (ref 4–12)
Bilirubin (Total): 0.3 mg/dL (ref 0.2–1.3)
Calcium: 8.1 mg/dL — ABNORMAL LOW (ref 8.9–10.2)
Carbon Dioxide, Total: 30 meq/L (ref 22–32)
Chloride: 97 meq/L — ABNORMAL LOW (ref 98–108)
Creatinine: 0.6 mg/dL (ref 0.51–1.18)
GFR, Calc, African American: >60 mL/min/{1.73_m2}
GFR, Calc, European American: >60 mL/min/{1.73_m2}
Glucose: 99 mg/dL (ref 62–125)
Potassium: 3.9 meq/L (ref 3.6–5.2)
Protein (Total): 5.1 g/dL — ABNORMAL LOW (ref 6.0–8.2)
Sodium: 132 meq/L — ABNORMAL LOW (ref 135–145)
Urea Nitrogen: 16 mg/dL (ref 8–21)

## 2015-09-19 LAB — MAGNESIUM: Magnesium: 1.8 mg/dL (ref 1.8–2.4)

## 2015-09-19 LAB — PHOSPHATE: Phosphate: 2.9 mg/dL (ref 2.5–4.5)

## 2015-09-19 LAB — PARTIAL THROMBOPLASTIN TIME: Partial Thromboplastin Time: 27 s (ref 22–35)

## 2015-09-19 LAB — TACROLIMUS BY LCMSMS: Tacrolimus By LCMSMS: 3.2 ng/mL

## 2015-09-19 LAB — CBC (HEMOGRAM)
Hematocrit: 26 % — ABNORMAL LOW (ref 38–50)
Hemoglobin: 8.3 g/dL — ABNORMAL LOW (ref 13.0–18.0)
MCH: 30.1 pg (ref 27.3–33.6)
MCHC: 32.3 g/dL (ref 32.2–36.5)
MCV: 93 fL (ref 81–98)
Platelet Count: 446 10*3/uL — ABNORMAL HIGH (ref 150–400)
RBC: 2.76 10*6/uL — ABNORMAL LOW (ref 4.40–5.60)
RDW-CV: 16.2 % — ABNORMAL HIGH (ref 11.6–14.4)
WBC: 13.78 10*3/uL — ABNORMAL HIGH (ref 4.3–10.0)

## 2015-09-20 ENCOUNTER — Other Ambulatory Visit (HOSPITAL_BASED_OUTPATIENT_CLINIC_OR_DEPARTMENT_OTHER): Payer: Self-pay | Admitting: Internal Medicine

## 2015-09-20 ENCOUNTER — Ambulatory Visit (HOSPITAL_BASED_OUTPATIENT_CLINIC_OR_DEPARTMENT_OTHER): Payer: Medicare Other

## 2015-09-20 ENCOUNTER — Encounter (HOSPITAL_BASED_OUTPATIENT_CLINIC_OR_DEPARTMENT_OTHER): Payer: Self-pay

## 2015-09-20 ENCOUNTER — Other Ambulatory Visit (HOSPITAL_COMMUNITY): Payer: Self-pay

## 2015-09-20 DIAGNOSIS — Z942 Lung transplant status: Secondary | ICD-10-CM

## 2015-09-20 DIAGNOSIS — Z4824 Encounter for aftercare following lung transplant: Secondary | ICD-10-CM

## 2015-09-20 LAB — TACROLIMUS BY LCMSMS: Tacrolimus By LCMSMS: 3.1 ng/mL

## 2015-09-20 LAB — COMPREHENSIVE METABOLIC PANEL
ALT (GPT): 45 U/L (ref 10–48)
AST (GOT): 20 U/L (ref 9–38)
Albumin: 2.5 g/dL — ABNORMAL LOW (ref 3.5–5.2)
Alkaline Phosphatase (Total): 81 U/L (ref 36–161)
Anion Gap: 8 (ref 4–12)
Bilirubin (Total): 0.3 mg/dL (ref 0.2–1.3)
Calcium: 8.2 mg/dL — ABNORMAL LOW (ref 8.9–10.2)
Carbon Dioxide, Total: 27 meq/L (ref 22–32)
Chloride: 102 meq/L (ref 98–108)
Creatinine: 0.61 mg/dL (ref 0.51–1.18)
GFR, Calc, African American: >60 mL/min/{1.73_m2}
GFR, Calc, European American: >60 mL/min/{1.73_m2}
Glucose: 98 mg/dL (ref 62–125)
Potassium: 4.3 meq/L (ref 3.6–5.2)
Protein (Total): 5.3 g/dL — ABNORMAL LOW (ref 6.0–8.2)
Sodium: 137 meq/L (ref 135–145)
Urea Nitrogen: 18 mg/dL (ref 8–21)

## 2015-09-20 LAB — PULMONARY FUNCTION TESTING
FEF25-75-%Pred-Pre: 87
FEF25-75-Pre: 2.76
FEV1-%Pred-Pre: 82
FEV1-Pre: 2.78
FEV1/FVC-Pre: 85
FVC-%Pred-Pre: 75
FVC-Pre: 3.26

## 2015-09-20 LAB — CBC (HEMOGRAM)
Hematocrit: 25 % — ABNORMAL LOW (ref 38–50)
Hemoglobin: 8.3 g/dL — ABNORMAL LOW (ref 13.0–18.0)
MCH: 30.3 pg (ref 27.3–33.6)
MCHC: 32.7 g/dL (ref 32.2–36.5)
MCV: 93 fL (ref 81–98)
Platelet Count: 454 10*3/uL — ABNORMAL HIGH (ref 150–400)
RBC: 2.74 10*6/uL — ABNORMAL LOW (ref 4.40–5.60)
RDW-CV: 16.5 % — ABNORMAL HIGH (ref 11.6–14.4)
WBC: 13.35 10*3/uL — ABNORMAL HIGH (ref 4.3–10.0)

## 2015-09-20 LAB — PARTIAL THROMBOPLASTIN TIME: Partial Thromboplastin Time: 25 s (ref 22–35)

## 2015-09-20 LAB — PHOSPHATE: Phosphate: 2.9 mg/dL (ref 2.5–4.5)

## 2015-09-20 LAB — MAGNESIUM: Magnesium: 2.1 mg/dL (ref 1.8–2.4)

## 2015-09-23 ENCOUNTER — Other Ambulatory Visit (HOSPITAL_COMMUNITY): Payer: Self-pay

## 2015-09-23 ENCOUNTER — Ambulatory Visit (HOSPITAL_BASED_OUTPATIENT_CLINIC_OR_DEPARTMENT_OTHER): Payer: Medicare Other

## 2015-09-23 ENCOUNTER — Encounter (HOSPITAL_COMMUNITY): Payer: Self-pay

## 2015-09-23 ENCOUNTER — Other Ambulatory Visit (HOSPITAL_BASED_OUTPATIENT_CLINIC_OR_DEPARTMENT_OTHER): Payer: Self-pay | Admitting: Internal Medicine

## 2015-09-23 ENCOUNTER — Encounter (HOSPITAL_BASED_OUTPATIENT_CLINIC_OR_DEPARTMENT_OTHER): Payer: Self-pay

## 2015-09-23 ENCOUNTER — Other Ambulatory Visit (HOSPITAL_BASED_OUTPATIENT_CLINIC_OR_DEPARTMENT_OTHER): Payer: Self-pay

## 2015-09-23 ENCOUNTER — Ambulatory Visit: Payer: Medicare Other | Attending: Internal Medicine | Admitting: Internal Medicine

## 2015-09-23 VITALS — BP 129/88 | HR 97 | Temp 97.5°F | Ht 68.5 in | Wt 140.0 lb

## 2015-09-23 DIAGNOSIS — Z4824 Encounter for aftercare following lung transplant: Secondary | ICD-10-CM | POA: Insufficient documentation

## 2015-09-23 DIAGNOSIS — K219 Gastro-esophageal reflux disease without esophagitis: Secondary | ICD-10-CM

## 2015-09-23 DIAGNOSIS — Z942 Lung transplant status: Secondary | ICD-10-CM

## 2015-09-23 DIAGNOSIS — Z5189 Encounter for other specified aftercare: Secondary | ICD-10-CM

## 2015-09-23 DIAGNOSIS — Y830 Surgical operation with transplant of whole organ as the cause of abnormal reaction of the patient, or of later complication, without mention of misadventure at the time of the procedure: Secondary | ICD-10-CM | POA: Insufficient documentation

## 2015-09-23 DIAGNOSIS — K5903 Drug induced constipation: Secondary | ICD-10-CM | POA: Insufficient documentation

## 2015-09-23 DIAGNOSIS — D899 Disorder involving the immune mechanism, unspecified: Secondary | ICD-10-CM

## 2015-09-23 DIAGNOSIS — T86818 Other complications of lung transplant: Secondary | ICD-10-CM | POA: Insufficient documentation

## 2015-09-23 DIAGNOSIS — D849 Immunodeficiency, unspecified: Secondary | ICD-10-CM

## 2015-09-23 DIAGNOSIS — A310 Pulmonary mycobacterial infection: Secondary | ICD-10-CM | POA: Insufficient documentation

## 2015-09-23 LAB — PULMONARY FUNCTION TESTING
FEF25-75-%Pred-Pre: 85
FEF25-75-Pre: 2.72
FEV1-%Pred-Pre: 81
FEV1-Pre: 2.73
FEV1/FVC-Pre: 82
FVC-%Pred-Pre: 77
FVC-Pre: 3.32

## 2015-09-23 LAB — TACROLIMUS BY LCMSMS: Tacrolimus By LCMSMS: 3.4 ng/mL

## 2015-09-23 MED ORDER — POLYETHYLENE GLYCOL 3350 POWD
17.0000 g | Freq: Two times a day (BID) | Status: DC | PRN
Start: 2015-09-23 — End: 2015-10-30

## 2015-09-23 MED ORDER — ETHAMBUTOL HCL 400 MG OR TABS
1200.0000 mg | ORAL_TABLET | Freq: Every day | ORAL | Status: DC
Start: 2015-09-23 — End: 2015-12-04

## 2015-09-23 MED ORDER — MOXIFLOXACIN HCL 400 MG OR TABS
400.0000 mg | ORAL_TABLET | Freq: Every day | ORAL | Status: DC
Start: 2015-09-23 — End: 2015-12-04

## 2015-09-23 MED ORDER — AZITHROMYCIN 500 MG OR TABS
500.0000 mg | ORAL_TABLET | Freq: Every day | ORAL | Status: DC
Start: 2015-09-23 — End: 2015-12-04

## 2015-09-23 MED ORDER — ISONIAZID 300 MG OR TABS
300.0000 mg | ORAL_TABLET | Freq: Every day | ORAL | Status: DC
Start: 2015-09-23 — End: 2015-12-04

## 2015-09-23 NOTE — Progress Notes (Signed)
Immunosuppressant dose change per [facility] transplant clinic note

## 2015-09-23 NOTE — Progress Notes (Addendum)
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      HOSPITAL COURSE (09/04/15 - 09/20/15):  Operative course was notable for extensive pleural scarring requiring extensive take down of adhesions but was otherwise uncomplicated.  Epidural was placed 09/06/15, the patient was extubated 09/07/15, and was weaned to RA by 09/08/15.  His hospital course was c/b delirium (several days of visual and auditory hallucinations, confabulation, disorientation; Psychiatry was consulted 09/16/15; he was treated with haloperidol) and dysphagia that improved throughout his hospitalization (VFSS 1/30 showed mild-mod oropharyngeal dysphagia; diet ultimately advanced to thin liquids 09/14/15).  He was transferred from the CT-ICU to the floor on 09/10/14.  He received daily diuresis (Lasix 40mg  IV then transitioned to 40mg  PO QD) and TBB was roughly 1 L neg QD.  Last recorded weight in the hospital was 64 kg (from 68 kg prior to transplant).  Donor bronchoscopy cultures were positive for MSSA and C. albicans.  He was treated with vancomycin IV x3d (blood cultures were ultimately negative) and fluconazole 200mg  (ongoing x4 weeks).  Dakota Marsh explanted L lung was notably found to have caseating granulomatous inflammation, positive for AFB.  He was placed in respiratory isolation 09/13/15 - 09/18/15.  Sputum samples were AFB negative x3.  PCR demonstrated Mycobacterium Xenopi but results were finalized after the patient was discharged, and treatment has not been initiated.  In consultation with ID, we have a plan for combination therapy with azithromycin, moxifloxacin, ethambutol, and INH (sensitivity testing was not possible d/t tissue fixation).      INTERVAL Hx:  Dakota Marsh reports feeling well during his past few days at home.  He is taking oxycodone 5mg  as needed, once or twice daily - mostly  1 tablet, sometimes 2 tablets.  Had some initial discomfort with lying flat at night d/t chest wall tightness, but this has improved.  Reports clear discharge from the left lateral aspect of his clamshell incision but no incisional pain or swelling.  He is regularly changing his incisional dressing and has been quite pleased by the effectiveness of using maxipads for absorption.  He has been ambulating around the house, dressing himself, eating full meals, and taking his medications without difficulty.  He reports occasional cough that is minimally productive of white/clear sputum.  Denies abdominal pain but has not had a BM since discharge from the hospital (requests Miralax, which has worked in the past).  No difficulty with food - no coughing with PO intake or GERD symptoms.  He is curious to know whether mushrooms and strong cheeses are safe to eat (given fungal/mold exposure). He is otherwise without complaints.    Complete ROS negative aside from details above.    PROBLEMS:  S/p BOLT 09/05/15  GERD ([place] 7.0 06/2015, Shatzi ring s/p dilation 11/2014)  HTN  CAD ([facility] 06/2015 with 50% distal RCA stenosis)  Depression/anxiety    MEDs:  IS:  Prednisone taper down to 40mg  QD, tacrolimus 5mg  BID, MMF 1g BID  Abx:  See plan below for NTB treatment plan, otherwise continuing Bactrim SS QD, fluconazole 200mg  QD (through 10/05/15) and valGCV 900mg  QD (through 12/05/15)  Medications otherwise reviewed and updated in the EMR    ALLERGIES:  Codeine - anxiety/dyspnea    EXAM:  Filed Vitals:    09/23/15 1037   BP: 129/88   Pulse: 97  Temp: 97.5 F (36.4 C)   TempSrc: Temporal   Height: 5' 8.5" (1.74 m)   Weight: 140 lb (63.504 kg)   SpO2: 98%   Body mass index is 20.98 kg/(m^2).  Thin older white man sitting comfortably upright in a chair.  Appropriate in conversation - making jokes.  Jumps up on his feet to remove shirt and demonstrate his incision.  Clamshell incision healing well - remains mostly covered with  steri-strips but exposed areas are not erythematous or edematous; no active discharge though bandage over left lateral aspect of the incision has absorbed serosanguinous drainage.  Chest tube and PICC sites also healing well.  Heart RRR, no JVD.  Lungs mostly clear, faint bibasilar rales, symmetric chest rise.  Abdomen soft/NT.  Extremities WWP, no edema.    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  He is managing his medications effectively, increasing his activity levels as expected, and appears euvolemic on exam.     PLAN:  --continue prednisone (taper per protocol), MMF, and FK (goal 8-10)  --continue valganciclovir ppx x26mo for CMV D+/R+ - ~12/04/15  --continue fluconazole x49mo for donor Candida (complication of lung transplant) - ~10/05/15  --continue bactrim ppx indefinitely  --initiating treatment for Mycobacterium Xenopi:  --patient educated regarding minimum 3 month treatment plan and potential SEs of medications - GI symptoms, neuropathy, and potential impact on liver function  --initiating azithromycin 500mg  QD, moxifloxacin 400mg  qd, INH 300mg  qd, ethambutol 1200mg  qd  --checking LFTs today  --f/u labs including tacro and CMV levels; med adjustment as indicated  --educated regarding "common sense" dietary restrictions (but advised that cooked mushrooms and blue cheese were fine to eat)  --continue regular dry dressing changes to maintaining clean incision and promote healing  --return for  clinic follow-up in one week    The patient was seen and discussed with Attending Dr. Dian Situ

## 2015-09-24 DIAGNOSIS — A310 Pulmonary mycobacterial infection: Secondary | ICD-10-CM | POA: Insufficient documentation

## 2015-09-24 DIAGNOSIS — Z942 Lung transplant status: Secondary | ICD-10-CM | POA: Insufficient documentation

## 2015-09-24 DIAGNOSIS — I2510 Atherosclerotic heart disease of native coronary artery without angina pectoris: Secondary | ICD-10-CM | POA: Insufficient documentation

## 2015-09-24 LAB — LUNG TRX, POST OP EXTENDED PANEL
% Basophils: 0 %
% Eosinophils: 0 %
% Immature Granulocytes: 0 %
% Lymphocytes: 18 %
% Monocytes: 4 %
% Neutrophils: 78 %
% Nucleated RBC: 0 %
ALT (GPT): 32 U/L (ref 10–48)
AST (GOT): 17 U/L (ref 9–38)
Absolute Eosinophil Count: 0 10*3/uL (ref 0.00–0.50)
Absolute Lymphocyte Count: 2.09 10*3/uL (ref 1.00–4.80)
Albumin: 2.8 g/dL — ABNORMAL LOW (ref 3.5–5.2)
Alkaline Phosphatase (Total): 70 U/L (ref 36–161)
Anion Gap: 5 (ref 4–12)
Basophils: 0 10*3/uL (ref 0.00–0.20)
Bilirubin (Total): 0.3 mg/dL (ref 0.2–1.3)
CMV Quant Result: NOT DETECTED [IU]/mL
Calcium: 8.2 mg/dL — ABNORMAL LOW (ref 8.9–10.2)
Carbon Dioxide, Total: 27 meq/L (ref 22–32)
Chloride: 100 meq/L (ref 98–108)
Creatinine: 0.61 mg/dL (ref 0.51–1.18)
GFR, Calc, African American: >60 mL/min/{1.73_m2}
GFR, Calc, European American: >60 mL/min/{1.73_m2}
Glucose: 94 mg/dL (ref 62–125)
Hematocrit: 29 % — ABNORMAL LOW (ref 38–50)
Hemoglobin: 8.8 g/dL — ABNORMAL LOW (ref 13.0–18.0)
Immature Granulocytes: 0 10*3/uL (ref 0.00–0.05)
MCH: 29.1 pg (ref 27.3–33.6)
MCHC: 30.9 g/dL — ABNORMAL LOW (ref 32.2–36.5)
MCV: 94 fL (ref 81–98)
Magnesium: 1.9 mg/dL (ref 1.8–2.4)
Monocytes: 0.47 10*3/uL (ref 0.00–0.80)
Neutrophils: 9.07 10*3/uL — ABNORMAL HIGH (ref 1.80–7.00)
Nucleated RBC: 0 10*3/uL
Phosphate: 2.7 mg/dL (ref 2.5–4.5)
Platelet Count: 504 10*3/uL — ABNORMAL HIGH (ref 150–400)
Potassium: 4.4 meq/L (ref 3.6–5.2)
Protein (Total): 5.6 g/dL — ABNORMAL LOW (ref 6.0–8.2)
RBC: 3.02 10*6/uL — ABNORMAL LOW (ref 4.40–5.60)
RDW-CV: 17.1 % — ABNORMAL HIGH (ref 11.6–14.4)
Sodium: 132 meq/L — ABNORMAL LOW (ref 135–145)
Urea Nitrogen: 21 mg/dL (ref 8–21)
WBC: 11.63 10*3/uL — ABNORMAL HIGH (ref 4.3–10.0)

## 2015-09-24 NOTE — Addendum Note (Signed)
Addended by: Isidoro Donning on: 09/24/2015 05:54 AM     Modules accepted: Level of Service

## 2015-09-24 NOTE — Progress Notes (Signed)
Thank you for the opportunity to participate in the care of [place].  I personally evaluated the patient, reviewed the history, imaging, and laboratory results, and agree with Dr. Jerrilyn Cairo note.  The patient understood and agreed to this plan - no barriers to understanding/learning identified.  Please feel free to contact me with further questions.

## 2015-09-25 ENCOUNTER — Other Ambulatory Visit (HOSPITAL_BASED_OUTPATIENT_CLINIC_OR_DEPARTMENT_OTHER): Payer: Self-pay | Admitting: Transplant

## 2015-09-25 DIAGNOSIS — Z942 Lung transplant status: Secondary | ICD-10-CM

## 2015-09-29 ENCOUNTER — Telehealth (HOSPITAL_BASED_OUTPATIENT_CLINIC_OR_DEPARTMENT_OTHER): Payer: Self-pay | Admitting: Internal Medicine

## 2015-09-29 NOTE — Telephone Encounter (Signed)
I was paged to Dakota Marsh phone this morning to discuss a new "gurgling"/"slushing" sensation in his chest.  He woke this morning with the feeling that gas was bubbling up from his L lower lateral chest vs LUQ and rising more centrally into his chest.  He denied "pain" but was concerned because he has not felt this sensation before.  His oxygenation is currently 95-96% on room air and his breathing feels comfortable.  His home spirometry numbers have remained stable.  He otherwise feels well - denied fevers, chills, sweats, URI symptoms, change in bowel habits, loss of appetite, skin changes, etc.  He thinks he may have eaten dinner a little later than usual.  Denies alcohol intake.  Does not take a PPI and has never had heartburn, but his records indicate he has a h/o GERD (DeMeester 7.0 06/2015) and benign Schatzki's ring on EGD 11/2014, which was dilated.  We discussed my hypothesis that the symptoms he is experiencing may represent reflux.  I asked him to take note of whether his symptoms improve as he continues to be upright, rather than supine.  I recommended meals several hours prior to lying supine when possible.  We reviewed common trigger foods and I recommend he try to correlate his symptoms to specific foods/drinks.  We also discussed that elevating the head might be a helpful strategy if his symptoms recur.  He knows to contact me again if his symptoms worsen or he develops cough or concerning change in his breathing.

## 2015-09-30 ENCOUNTER — Inpatient Hospital Stay
Admission: EM | Admit: 2015-09-30 | Discharge: 2015-10-06 | DRG: 908 | Disposition: A | Discharge status: Home / Self Care | Payer: Medicare Other | Source: Ambulatory Visit | Attending: Thoracic Surgery (Cardiothoracic Vascular Surgery) | Admitting: Thoracic Surgery (Cardiothoracic Vascular Surgery)

## 2015-09-30 ENCOUNTER — Other Ambulatory Visit: Payer: Self-pay | Admitting: Internal Medicine

## 2015-09-30 ENCOUNTER — Telehealth (HOSPITAL_BASED_OUTPATIENT_CLINIC_OR_DEPARTMENT_OTHER): Payer: Self-pay | Admitting: Internal Medicine

## 2015-09-30 ENCOUNTER — Inpatient Hospital Stay (HOSPITAL_COMMUNITY): Payer: Medicare Other | Admitting: Thoracic Surgery (Cardiothoracic Vascular Surgery)

## 2015-09-30 ENCOUNTER — Other Ambulatory Visit: Payer: Self-pay | Admitting: Thoracic Surgery (Cardiothoracic Vascular Surgery)

## 2015-09-30 DIAGNOSIS — Z452 Encounter for adjustment and management of vascular access device: Secondary | ICD-10-CM

## 2015-09-30 DIAGNOSIS — J95811 Postprocedural pneumothorax: Secondary | ICD-10-CM | POA: Diagnosis present

## 2015-09-30 DIAGNOSIS — T8132XA Disruption of internal operation (surgical) wound, not elsewhere classified, initial encounter: Principal | ICD-10-CM | POA: Diagnosis present

## 2015-09-30 DIAGNOSIS — J90 Pleural effusion, not elsewhere classified: Secondary | ICD-10-CM

## 2015-09-30 DIAGNOSIS — F329 Major depressive disorder, single episode, unspecified: Secondary | ICD-10-CM | POA: Diagnosis present

## 2015-09-30 DIAGNOSIS — J9383 Other pneumothorax: Secondary | ICD-10-CM

## 2015-09-30 DIAGNOSIS — Z942 Lung transplant status: Secondary | ICD-10-CM

## 2015-09-30 DIAGNOSIS — Z7982 Long term (current) use of aspirin: Secondary | ICD-10-CM

## 2015-09-30 DIAGNOSIS — K219 Gastro-esophageal reflux disease without esophagitis: Secondary | ICD-10-CM | POA: Diagnosis present

## 2015-09-30 DIAGNOSIS — J939 Pneumothorax, unspecified: Secondary | ICD-10-CM

## 2015-09-30 DIAGNOSIS — R0602 Shortness of breath: Secondary | ICD-10-CM

## 2015-09-30 DIAGNOSIS — F419 Anxiety disorder, unspecified: Secondary | ICD-10-CM | POA: Diagnosis present

## 2015-09-30 DIAGNOSIS — I10 Essential (primary) hypertension: Secondary | ICD-10-CM | POA: Diagnosis present

## 2015-09-30 DIAGNOSIS — A318 Other mycobacterial infections: Secondary | ICD-10-CM | POA: Diagnosis present

## 2015-09-30 DIAGNOSIS — Z9119 Patient's noncompliance with other medical treatment and regimen: Secondary | ICD-10-CM

## 2015-09-30 DIAGNOSIS — I2510 Atherosclerotic heart disease of native coronary artery without angina pectoris: Secondary | ICD-10-CM | POA: Diagnosis present

## 2015-09-30 DIAGNOSIS — Z87891 Personal history of nicotine dependence: Secondary | ICD-10-CM

## 2015-09-30 LAB — BLOOD GAS, VENOUS, W/ ICA, NA, K, LACT, GLU
Base Deficit Blood, VEN: 0.1 meq/L (ref 0.0–2.0)
Bicarbonate, VEN: 24 meq/L (ref 23–27)
Calcium (Ionized): 1.16 mmol/L — ABNORMAL LOW (ref 1.18–1.38)
Glucose: 126 mg/dL — ABNORMAL HIGH (ref 62–125)
Hematocrit: 30 % — ABNORMAL LOW (ref 38–50)
Hemoglobin: 9.7 g/dL — ABNORMAL LOW (ref 13.0–18.0)
L Lactate (Direct), Venous Whole Blood: 1.2 mmol/L (ref 0.6–1.9)
O2 Saturation, VEN: 92 % — ABNORMAL HIGH (ref 70–75)
Potassium: 4.7 meq/L (ref 3.7–5.2)
Sodium: 133 meq/L — ABNORMAL LOW (ref 136–145)
pCO2, VEN: 36 mmHg — ABNORMAL LOW (ref 42–50)
pH, VEN: 7.44 — ABNORMAL HIGH (ref 7.32–7.40)
pO2, VEN: 63 mmHg — ABNORMAL HIGH (ref 35–40)

## 2015-09-30 LAB — PROTHROMBIN & PTT
Partial Thromboplastin Time: 26 s (ref 22–35)
Prothrombin INR: 1.1 (ref 0.8–1.3)
Prothrombin Time Patient: 13.5 s (ref 10.7–15.6)

## 2015-09-30 LAB — COMPREHENSIVE METABOLIC PANEL
ALT (GPT): 33 U/L (ref 10–48)
AST (GOT): 18 U/L (ref 9–38)
Albumin: 3.2 g/dL — ABNORMAL LOW (ref 3.5–5.2)
Alkaline Phosphatase (Total): 79 U/L (ref 36–161)
Anion Gap: 8 (ref 4–12)
Bilirubin (Total): 0.2 mg/dL (ref 0.2–1.3)
Calcium: 8.7 mg/dL — ABNORMAL LOW (ref 8.9–10.2)
Carbon Dioxide, Total: 23 meq/L (ref 22–32)
Chloride: 98 meq/L (ref 98–108)
Creatinine: 0.86 mg/dL (ref 0.51–1.18)
GFR, Calc, African American: >60 mL/min/{1.73_m2}
GFR, Calc, European American: >60 mL/min/{1.73_m2}
Glucose: 141 mg/dL — ABNORMAL HIGH (ref 62–125)
Potassium: 4.9 meq/L (ref 3.6–5.2)
Protein (Total): 6.1 g/dL (ref 6.0–8.2)
Sodium: 129 meq/L — ABNORMAL LOW (ref 135–145)
Urea Nitrogen: 33 mg/dL — ABNORMAL HIGH (ref 8–21)

## 2015-09-30 LAB — CBC, DIFF
% Basophils: 0 %
% Eosinophils: 0 %
% Immature Granulocytes: 1 %
% Lymphocytes: 6 %
% Monocytes: 7 %
% Neutrophils: 86 %
% Nucleated RBC: 0 %
Absolute Eosinophil Count: 0 10*3/uL (ref 0.00–0.50)
Absolute Lymphocyte Count: 0.55 10*3/uL — ABNORMAL LOW (ref 1.00–4.80)
Basophils: 0 10*3/uL (ref 0.00–0.20)
Hematocrit: 29 % — ABNORMAL LOW (ref 38–50)
Hemoglobin: 9.5 g/dL — ABNORMAL LOW (ref 13.0–18.0)
Immature Granulocytes: 0.09 10*3/uL — ABNORMAL HIGH (ref 0.00–0.05)
MCH: 30.3 pg (ref 27.3–33.6)
MCHC: 32.3 g/dL (ref 32.2–36.5)
MCV: 94 fL (ref 81–98)
Monocytes: 0.64 10*3/uL (ref 0.00–0.80)
Neutrophils: 7.89 10*3/uL — ABNORMAL HIGH (ref 1.80–7.00)
Nucleated RBC: 0 10*3/uL
Platelet Count: 472 10*3/uL — ABNORMAL HIGH (ref 150–400)
RBC: 3.14 10*6/uL — ABNORMAL LOW (ref 4.40–5.60)
RDW-CV: 17.2 % — ABNORMAL HIGH (ref 11.6–14.4)
WBC: 9.18 10*3/uL (ref 4.3–10.0)

## 2015-09-30 LAB — TROPONIN_I
Troponin_I Interpretation: NORMAL
Troponin_I: 0.03 ng/mL (ref <0.04)

## 2015-09-30 LAB — L LACTATE, VENOUS PLASMA (TO U/H LAB > 30 MIN): L Lactate (Indirect), VEN: 1.4 mmol/L (ref 0.5–2.2)

## 2015-09-30 LAB — B_TYPE NATRIURETIC PEPTIDE: B_Type Natriuretic Peptide: 188 pg/mL — ABNORMAL HIGH (ref <101)

## 2015-09-30 NOTE — Telephone Encounter (Signed)
I was paged by Dakota Marsh daughter this evening to discuss concerns regarding his breathing.  She described outpouching of his chest wall during inspiration and possible outlet of air with "gargling" sound.  She had difficulty describing the problem further but explained that her father was feeling more dyspneic with exertion and that his oxygenation transiently dropped to 86% (though currently 96% on room air while ambulating).  Given evolution of this issue (see my telephone note from yesterday) and worsening respiratory function, I recommended that she bring Dakota Marsh to our Emergency Department for in person evaluation.  I am concerned about wound healing at the left lateral aspect of his clamshell incision (which was continuing to drain serosanguinous fluid at the time of our clinic visit last week).  Though unusual, the description of air leak with inspiration raises concern for a tract allowing communication of airways and pleural space with the chest wall.  Depending on the patient's exam, it may be reasonable to involve the Thoracic Surgery team in the Emergency Department for further evaluation.  A CXR would also be useful to investigate the possibility of a PTX.  I am happy to discuss the situation further with ED providers when the patient arrives.    Gwenevere Ghazi, MD, MPH  Pulmonary Transplant Fellow  Pager [phone]

## 2015-10-01 ENCOUNTER — Encounter (HOSPITAL_BASED_OUTPATIENT_CLINIC_OR_DEPARTMENT_OTHER): Payer: Medicare Other

## 2015-10-01 ENCOUNTER — Other Ambulatory Visit (HOSPITAL_BASED_OUTPATIENT_CLINIC_OR_DEPARTMENT_OTHER): Payer: Self-pay | Admitting: Thoracic Surgery (Cardiothoracic Vascular Surgery)

## 2015-10-01 ENCOUNTER — Other Ambulatory Visit: Payer: Self-pay | Admitting: Student in an Organized Health Care Education/Training Program

## 2015-10-01 ENCOUNTER — Other Ambulatory Visit: Payer: Self-pay | Admitting: Surgery

## 2015-10-01 DIAGNOSIS — Z8709 Personal history of other diseases of the respiratory system: Secondary | ICD-10-CM

## 2015-10-01 DIAGNOSIS — J9383 Other pneumothorax: Secondary | ICD-10-CM

## 2015-10-01 DIAGNOSIS — J9811 Atelectasis: Secondary | ICD-10-CM

## 2015-10-01 DIAGNOSIS — Z942 Lung transplant status: Secondary | ICD-10-CM

## 2015-10-01 LAB — CBC (HEMOGRAM)
Hematocrit: 30 % — ABNORMAL LOW (ref 38–50)
Hemoglobin: 9.5 g/dL — ABNORMAL LOW (ref 13.0–18.0)
MCH: 30 pg (ref 27.3–33.6)
MCHC: 32.1 g/dL — ABNORMAL LOW (ref 32.2–36.5)
MCV: 93 fL (ref 81–98)
Platelet Count: 442 10*3/uL — ABNORMAL HIGH (ref 150–400)
RBC: 3.17 10*6/uL — ABNORMAL LOW (ref 4.40–5.60)
RDW-CV: 17.1 % — ABNORMAL HIGH (ref 11.6–14.4)
WBC: 7.89 10*3/uL (ref 4.3–10.0)

## 2015-10-01 LAB — BASIC METABOLIC PANEL
Anion Gap: 7 (ref 4–12)
Calcium: 8.3 mg/dL — ABNORMAL LOW (ref 8.9–10.2)
Carbon Dioxide, Total: 27 meq/L (ref 22–32)
Chloride: 99 meq/L (ref 98–108)
Creatinine: 0.7 mg/dL (ref 0.51–1.18)
GFR, Calc, African American: >60 mL/min/{1.73_m2}
GFR, Calc, European American: >60 mL/min/{1.73_m2}
Glucose: 88 mg/dL (ref 62–125)
Potassium: 4.5 meq/L (ref 3.6–5.2)
Sodium: 133 meq/L — ABNORMAL LOW (ref 135–145)
Urea Nitrogen: 28 mg/dL — ABNORMAL HIGH (ref 8–21)

## 2015-10-02 ENCOUNTER — Other Ambulatory Visit (HOSPITAL_BASED_OUTPATIENT_CLINIC_OR_DEPARTMENT_OTHER): Payer: Self-pay

## 2015-10-02 ENCOUNTER — Other Ambulatory Visit: Payer: Self-pay | Admitting: Student in an Organized Health Care Education/Training Program

## 2015-10-02 ENCOUNTER — Ambulatory Visit (HOSPITAL_BASED_OUTPATIENT_CLINIC_OR_DEPARTMENT_OTHER): Payer: Medicare Other

## 2015-10-02 DIAGNOSIS — J939 Pneumothorax, unspecified: Secondary | ICD-10-CM

## 2015-10-02 DIAGNOSIS — T86818 Other complications of lung transplant: Secondary | ICD-10-CM

## 2015-10-02 LAB — PROTHROMBIN & PTT
Partial Thromboplastin Time: 32 s (ref 22–35)
Prothrombin INR: 1.1 (ref 0.8–1.3)
Prothrombin Time Patient: 13.2 s (ref 10.7–15.6)

## 2015-10-02 LAB — BASIC METABOLIC PANEL
Anion Gap: 12 (ref 4–12)
Calcium: 8.2 mg/dL — ABNORMAL LOW (ref 8.9–10.2)
Carbon Dioxide, Total: 24 meq/L (ref 22–32)
Chloride: 99 meq/L (ref 98–108)
Creatinine: 0.66 mg/dL (ref 0.51–1.18)
GFR, Calc, African American: >60 mL/min/{1.73_m2}
GFR, Calc, European American: >60 mL/min/{1.73_m2}
Glucose: 93 mg/dL (ref 62–125)
Potassium: 4.4 meq/L (ref 3.6–5.2)
Sodium: 135 meq/L (ref 135–145)
Urea Nitrogen: 26 mg/dL — ABNORMAL HIGH (ref 8–21)

## 2015-10-02 LAB — CBC (HEMOGRAM)
Hematocrit: 29 % — ABNORMAL LOW (ref 38–50)
Hemoglobin: 9.2 g/dL — ABNORMAL LOW (ref 13.0–18.0)
MCH: 29.5 pg (ref 27.3–33.6)
MCHC: 31.7 g/dL — ABNORMAL LOW (ref 32.2–36.5)
MCV: 93 fL (ref 81–98)
Platelet Count: 403 10*3/uL — ABNORMAL HIGH (ref 150–400)
RBC: 3.12 10*6/uL — ABNORMAL LOW (ref 4.40–5.60)
RDW-CV: 16.7 % — ABNORMAL HIGH (ref 11.6–14.4)
WBC: 8.84 10*3/uL (ref 4.3–10.0)

## 2015-10-02 LAB — TACROLIMUS BY LCMSMS: Tacrolimus By LCMSMS: 16.9 ng/mL

## 2015-10-03 ENCOUNTER — Other Ambulatory Visit: Payer: Self-pay | Admitting: Student in an Organized Health Care Education/Training Program

## 2015-10-03 ENCOUNTER — Other Ambulatory Visit (HOSPITAL_BASED_OUTPATIENT_CLINIC_OR_DEPARTMENT_OTHER): Payer: Self-pay | Admitting: Transplant

## 2015-10-03 ENCOUNTER — Other Ambulatory Visit: Payer: Self-pay | Admitting: Thoracic Surgery (Cardiothoracic Vascular Surgery)

## 2015-10-03 DIAGNOSIS — Z942 Lung transplant status: Secondary | ICD-10-CM

## 2015-10-03 DIAGNOSIS — J439 Emphysema, unspecified: Secondary | ICD-10-CM

## 2015-10-03 DIAGNOSIS — Z48298 Encounter for aftercare following other organ transplant: Secondary | ICD-10-CM

## 2015-10-03 DIAGNOSIS — T8132XA Disruption of internal operation (surgical) wound, not elsewhere classified, initial encounter: Secondary | ICD-10-CM

## 2015-10-03 LAB — CBC (HEMOGRAM)
Hematocrit: 30 % — ABNORMAL LOW (ref 38–50)
Hemoglobin: 9.4 g/dL — ABNORMAL LOW (ref 13.0–18.0)
MCH: 30 pg (ref 27.3–33.6)
MCHC: 31.9 g/dL — ABNORMAL LOW (ref 32.2–36.5)
MCV: 94 fL (ref 81–98)
Platelet Count: 399 10*3/uL (ref 150–400)
RBC: 3.13 10*6/uL — ABNORMAL LOW (ref 4.40–5.60)
RDW-CV: 16.7 % — ABNORMAL HIGH (ref 11.6–14.4)
WBC: 10.29 10*3/uL — ABNORMAL HIGH (ref 4.3–10.0)

## 2015-10-03 LAB — BASIC METABOLIC PANEL
Anion Gap: 8 (ref 4–12)
Calcium: 8.4 mg/dL — ABNORMAL LOW (ref 8.9–10.2)
Carbon Dioxide, Total: 27 meq/L (ref 22–32)
Chloride: 98 meq/L (ref 98–108)
Creatinine: 0.66 mg/dL (ref 0.51–1.18)
GFR, Calc, African American: >60 mL/min/{1.73_m2}
GFR, Calc, European American: >60 mL/min/{1.73_m2}
Glucose: 89 mg/dL (ref 62–125)
Potassium: 4.7 meq/L (ref 3.6–5.2)
Sodium: 133 meq/L — ABNORMAL LOW (ref 135–145)
Urea Nitrogen: 21 mg/dL (ref 8–21)

## 2015-10-03 LAB — TACROLIMUS BY LCMSMS: Tacrolimus By LCMSMS: 12.6 ng/mL

## 2015-10-03 LAB — WOUND FUNGAL W/DIRECT EXAM

## 2015-10-04 ENCOUNTER — Other Ambulatory Visit: Payer: Self-pay | Admitting: Student in an Organized Health Care Education/Training Program

## 2015-10-04 DIAGNOSIS — Z942 Lung transplant status: Secondary | ICD-10-CM

## 2015-10-04 DIAGNOSIS — Z4682 Encounter for fitting and adjustment of non-vascular catheter: Secondary | ICD-10-CM

## 2015-10-04 LAB — CBC (HEMOGRAM)
Hematocrit: 29 % — ABNORMAL LOW (ref 38–50)
Hemoglobin: 9.4 g/dL — ABNORMAL LOW (ref 13.0–18.0)
MCH: 30 pg (ref 27.3–33.6)
MCHC: 32.1 g/dL — ABNORMAL LOW (ref 32.2–36.5)
MCV: 94 fL (ref 81–98)
Platelet Count: 403 10*3/uL — ABNORMAL HIGH (ref 150–400)
RBC: 3.13 10*6/uL — ABNORMAL LOW (ref 4.40–5.60)
RDW-CV: 16.8 % — ABNORMAL HIGH (ref 11.6–14.4)
WBC: 15.91 10*3/uL — ABNORMAL HIGH (ref 4.3–10.0)

## 2015-10-04 LAB — BASIC METABOLIC PANEL
Anion Gap: 9 (ref 4–12)
Calcium: 8.3 mg/dL — ABNORMAL LOW (ref 8.9–10.2)
Carbon Dioxide, Total: 25 meq/L (ref 22–32)
Chloride: 99 meq/L (ref 98–108)
Creatinine: 0.6 mg/dL (ref 0.51–1.18)
GFR, Calc, African American: >60 mL/min/{1.73_m2}
GFR, Calc, European American: >60 mL/min/{1.73_m2}
Glucose: 122 mg/dL (ref 62–125)
Potassium: 4.7 meq/L (ref 3.6–5.2)
Sodium: 133 meq/L — ABNORMAL LOW (ref 135–145)
Urea Nitrogen: 22 mg/dL — ABNORMAL HIGH (ref 8–21)

## 2015-10-04 LAB — TACROLIMUS BY LCMSMS: Tacrolimus By LCMSMS: 8.7 ng/mL

## 2015-10-05 ENCOUNTER — Other Ambulatory Visit: Payer: Self-pay | Admitting: Student in an Organized Health Care Education/Training Program

## 2015-10-05 ENCOUNTER — Other Ambulatory Visit: Payer: Self-pay | Admitting: Surgery

## 2015-10-05 LAB — BASIC METABOLIC PANEL
Anion Gap: 6 (ref 4–12)
Calcium: 8.6 mg/dL — ABNORMAL LOW (ref 8.9–10.2)
Carbon Dioxide, Total: 28 meq/L (ref 22–32)
Chloride: 98 meq/L (ref 98–108)
Creatinine: 0.74 mg/dL (ref 0.51–1.18)
GFR, Calc, African American: >60 mL/min/{1.73_m2}
GFR, Calc, European American: >60 mL/min/{1.73_m2}
Glucose: 96 mg/dL (ref 62–125)
Potassium: 4.6 meq/L (ref 3.6–5.2)
Sodium: 132 meq/L — ABNORMAL LOW (ref 135–145)
Urea Nitrogen: 24 mg/dL — ABNORMAL HIGH (ref 8–21)

## 2015-10-05 LAB — CBC (HEMOGRAM)
Hematocrit: 30 % — ABNORMAL LOW (ref 38–50)
Hemoglobin: 9.5 g/dL — ABNORMAL LOW (ref 13.0–18.0)
MCH: 29.7 pg (ref 27.3–33.6)
MCHC: 31.4 g/dL — ABNORMAL LOW (ref 32.2–36.5)
MCV: 95 fL (ref 81–98)
Platelet Count: 359 10*3/uL (ref 150–400)
RBC: 3.2 10*6/uL — ABNORMAL LOW (ref 4.40–5.60)
RDW-CV: 16.8 % — ABNORMAL HIGH (ref 11.6–14.4)
WBC: 13.58 10*3/uL — ABNORMAL HIGH (ref 4.3–10.0)

## 2015-10-05 LAB — TACROLIMUS BY LCMSMS: Tacrolimus By LCMSMS: 10.5 ng/mL

## 2015-10-06 ENCOUNTER — Other Ambulatory Visit: Payer: Self-pay | Admitting: Student in an Organized Health Care Education/Training Program

## 2015-10-06 ENCOUNTER — Other Ambulatory Visit (HOSPITAL_COMMUNITY): Payer: Self-pay

## 2015-10-06 ENCOUNTER — Other Ambulatory Visit (HOSPITAL_BASED_OUTPATIENT_CLINIC_OR_DEPARTMENT_OTHER): Payer: Self-pay | Admitting: Student in an Organized Health Care Education/Training Program

## 2015-10-06 DIAGNOSIS — Z4889 Encounter for other specified surgical aftercare: Secondary | ICD-10-CM

## 2015-10-06 DIAGNOSIS — Z942 Lung transplant status: Secondary | ICD-10-CM

## 2015-10-06 LAB — BASIC METABOLIC PANEL
Anion Gap: 7 (ref 4–12)
Calcium: 8.5 mg/dL — ABNORMAL LOW (ref 8.9–10.2)
Carbon Dioxide, Total: 26 meq/L (ref 22–32)
Chloride: 100 meq/L (ref 98–108)
Creatinine: 0.65 mg/dL (ref 0.51–1.18)
GFR, Calc, African American: >60 mL/min/{1.73_m2}
GFR, Calc, European American: >60 mL/min/{1.73_m2}
Glucose: 99 mg/dL (ref 62–125)
Potassium: 4.5 meq/L (ref 3.6–5.2)
Sodium: 133 meq/L — ABNORMAL LOW (ref 135–145)
Urea Nitrogen: 28 mg/dL — ABNORMAL HIGH (ref 8–21)

## 2015-10-06 LAB — CBC (HEMOGRAM)
Hematocrit: 28 % — ABNORMAL LOW (ref 38–50)
Hemoglobin: 9 g/dL — ABNORMAL LOW (ref 13.0–18.0)
MCH: 29.8 pg (ref 27.3–33.6)
MCHC: 31.8 g/dL — ABNORMAL LOW (ref 32.2–36.5)
MCV: 94 fL (ref 81–98)
Platelet Count: 341 10*3/uL (ref 150–400)
RBC: 3.02 10*6/uL — ABNORMAL LOW (ref 4.40–5.60)
RDW-CV: 16.8 % — ABNORMAL HIGH (ref 11.6–14.4)
WBC: 12.38 10*3/uL — ABNORMAL HIGH (ref 4.3–10.0)

## 2015-10-06 LAB — TACROLIMUS BY LCMSMS: Tacrolimus By LCMSMS: 19.5 ng/mL

## 2015-10-07 ENCOUNTER — Other Ambulatory Visit (HOSPITAL_BASED_OUTPATIENT_CLINIC_OR_DEPARTMENT_OTHER): Payer: Self-pay | Admitting: Internal Medicine

## 2015-10-07 ENCOUNTER — Other Ambulatory Visit (HOSPITAL_COMMUNITY): Payer: Self-pay

## 2015-10-07 ENCOUNTER — Encounter (HOSPITAL_BASED_OUTPATIENT_CLINIC_OR_DEPARTMENT_OTHER): Payer: Medicare Other | Admitting: Thoracic Surgery (Cardiothoracic Vascular Surgery)

## 2015-10-07 DIAGNOSIS — Z942 Lung transplant status: Secondary | ICD-10-CM

## 2015-10-07 DIAGNOSIS — Z48298 Encounter for aftercare following other organ transplant: Secondary | ICD-10-CM

## 2015-10-07 LAB — BACTERIAL DETECTION BY PCR: Culture: NOT DETECTED

## 2015-10-07 LAB — MTB COMPLEX PCR

## 2015-10-07 LAB — NON TB MYCOBACT BY PCR

## 2015-10-09 ENCOUNTER — Encounter (HOSPITAL_BASED_OUTPATIENT_CLINIC_OR_DEPARTMENT_OTHER): Payer: Medicare Other

## 2015-10-09 ENCOUNTER — Other Ambulatory Visit (HOSPITAL_BASED_OUTPATIENT_CLINIC_OR_DEPARTMENT_OTHER): Payer: Self-pay | Admitting: Internal Medicine

## 2015-10-09 ENCOUNTER — Encounter (HOSPITAL_BASED_OUTPATIENT_CLINIC_OR_DEPARTMENT_OTHER): Payer: Medicare Other | Admitting: Registered"

## 2015-10-09 DIAGNOSIS — Z942 Lung transplant status: Secondary | ICD-10-CM

## 2015-10-09 DIAGNOSIS — Z48298 Encounter for aftercare following other organ transplant: Secondary | ICD-10-CM

## 2015-10-10 ENCOUNTER — Other Ambulatory Visit (INDEPENDENT_AMBULATORY_CARE_PROVIDER_SITE_OTHER): Payer: Medicare Other

## 2015-10-10 ENCOUNTER — Other Ambulatory Visit (HOSPITAL_BASED_OUTPATIENT_CLINIC_OR_DEPARTMENT_OTHER): Payer: Medicare Other | Admitting: Internal Medicine

## 2015-10-10 ENCOUNTER — Other Ambulatory Visit
Admit: 2015-10-10 | Discharge: 2015-10-10 | Disposition: A | Discharge status: Home / Self Care | Payer: Medicare Other | Attending: Internal Medicine | Admitting: Internal Medicine

## 2015-10-10 ENCOUNTER — Other Ambulatory Visit (HOSPITAL_COMMUNITY): Payer: Self-pay

## 2015-10-10 DIAGNOSIS — Z942 Lung transplant status: Secondary | ICD-10-CM | POA: Insufficient documentation

## 2015-10-10 DIAGNOSIS — Z4824 Encounter for aftercare following lung transplant: Secondary | ICD-10-CM

## 2015-10-10 DIAGNOSIS — Z48298 Encounter for aftercare following other organ transplant: Secondary | ICD-10-CM | POA: Insufficient documentation

## 2015-10-10 NOTE — Progress Notes (Signed)
Courtesy draw

## 2015-10-11 LAB — LUNG TRX, POST OP EXTENDED PANEL
% Basophils: 0 %
% Eosinophils: 0 %
% Immature Granulocytes: 3 %
% Lymphocytes: 16 %
% Monocytes: 5 %
% Neutrophils: 76 %
% Nucleated RBC: 0 %
ALT (GPT): 30 U/L (ref 10–48)
AST (GOT): 19 U/L (ref 9–38)
Absolute Eosinophil Count: 0.01 10*3/uL (ref 0.00–0.50)
Absolute Lymphocyte Count: 2.3 10*3/uL (ref 1.00–4.80)
Albumin: 3 g/dL — ABNORMAL LOW (ref 3.5–5.2)
Alkaline Phosphatase (Total): 84 U/L (ref 36–161)
Anion Gap: 10 (ref 4–12)
Basophils: 0.01 10*3/uL (ref 0.00–0.20)
Bilirubin (Total): 0.2 mg/dL (ref 0.2–1.3)
CMV Quant Result: NOT DETECTED [IU]/mL
Calcium: 8.8 mg/dL — ABNORMAL LOW (ref 8.9–10.2)
Carbon Dioxide, Total: 28 meq/L (ref 22–32)
Chloride: 101 meq/L (ref 98–108)
Creatinine: 0.69 mg/dL (ref 0.51–1.18)
GFR, Calc, African American: >60 mL/min/{1.73_m2}
GFR, Calc, European American: >60 mL/min/{1.73_m2}
Glucose: 90 mg/dL (ref 62–125)
Hematocrit: 31 % — ABNORMAL LOW (ref 38–50)
Hemoglobin: 9.8 g/dL — ABNORMAL LOW (ref 13.0–18.0)
Immature Granulocytes: 0.45 10*3/uL — ABNORMAL HIGH (ref 0.00–0.05)
MCH: 30.4 pg (ref 27.3–33.6)
MCHC: 31.6 g/dL — ABNORMAL LOW (ref 32.2–36.5)
MCV: 96 fL (ref 81–98)
Magnesium: 1.7 mg/dL — ABNORMAL LOW (ref 1.8–2.4)
Monocytes: 0.76 10*3/uL (ref 0.00–0.80)
Neutrophils: 11.34 10*3/uL — ABNORMAL HIGH (ref 1.80–7.00)
Nucleated RBC: 0 10*3/uL
Phosphate: 3.2 mg/dL (ref 2.5–4.5)
Platelet Count: 385 10*3/uL (ref 150–400)
Potassium: 4.7 meq/L (ref 3.6–5.2)
Protein (Total): 5.4 g/dL — ABNORMAL LOW (ref 6.0–8.2)
RBC: 3.22 10*6/uL — ABNORMAL LOW (ref 4.40–5.60)
RDW-CV: 17.2 % — ABNORMAL HIGH (ref 11.6–14.4)
Sodium: 139 meq/L (ref 135–145)
Urea Nitrogen: 25 mg/dL — ABNORMAL HIGH (ref 8–21)
WBC: 14.87 10*3/uL — ABNORMAL HIGH (ref 4.3–10.0)

## 2015-10-11 LAB — TACROLIMUS BY LCMSMS: Tacrolimus By LCMSMS: 7.7 ng/mL

## 2015-10-16 ENCOUNTER — Other Ambulatory Visit (HOSPITAL_BASED_OUTPATIENT_CLINIC_OR_DEPARTMENT_OTHER): Payer: Self-pay | Admitting: Internal Medicine

## 2015-10-16 ENCOUNTER — Other Ambulatory Visit (HOSPITAL_COMMUNITY): Payer: Self-pay

## 2015-10-16 ENCOUNTER — Encounter (HOSPITAL_BASED_OUTPATIENT_CLINIC_OR_DEPARTMENT_OTHER): Payer: Self-pay

## 2015-10-16 ENCOUNTER — Ambulatory Visit (HOSPITAL_BASED_OUTPATIENT_CLINIC_OR_DEPARTMENT_OTHER): Payer: Medicare Other

## 2015-10-16 ENCOUNTER — Ambulatory Visit: Payer: Medicare Other | Attending: Internal Medicine | Admitting: Transplant

## 2015-10-16 VITALS — BP 152/86 | HR 102 | Temp 97.7°F | Ht 68.5 in | Wt 144.0 lb

## 2015-10-16 DIAGNOSIS — Z48298 Encounter for aftercare following other organ transplant: Secondary | ICD-10-CM

## 2015-10-16 DIAGNOSIS — Z4824 Encounter for aftercare following lung transplant: Secondary | ICD-10-CM

## 2015-10-16 DIAGNOSIS — D899 Disorder involving the immune mechanism, unspecified: Secondary | ICD-10-CM | POA: Insufficient documentation

## 2015-10-16 DIAGNOSIS — Z942 Lung transplant status: Secondary | ICD-10-CM | POA: Insufficient documentation

## 2015-10-16 DIAGNOSIS — R0789 Other chest pain: Secondary | ICD-10-CM | POA: Insufficient documentation

## 2015-10-16 DIAGNOSIS — D849 Immunodeficiency, unspecified: Secondary | ICD-10-CM | POA: Insufficient documentation

## 2015-10-16 LAB — LUNG TRX, POST OP EXTENDED PANEL
% Basophils: 0 %
% Eosinophils: 0 %
% Immature Granulocytes: 2 %
% Lymphocytes: 17 %
% Monocytes: 7 %
% Neutrophils: 74 %
% Nucleated RBC: 0 %
ALT (GPT): 44 U/L (ref 10–48)
AST (GOT): 22 U/L (ref 9–38)
Absolute Eosinophil Count: 0 10*3/uL (ref 0.00–0.50)
Absolute Lymphocyte Count: 2.03 10*3/uL (ref 1.00–4.80)
Albumin: 3.3 g/dL — ABNORMAL LOW (ref 3.5–5.2)
Alkaline Phosphatase (Total): 76 U/L (ref 36–161)
Anion Gap: 9 (ref 4–12)
Basophils: 0.01 10*3/uL (ref 0.00–0.20)
Bilirubin (Total): 0.3 mg/dL (ref 0.2–1.3)
CMV Quant Result: NOT DETECTED [IU]/mL
Calcium: 9.1 mg/dL (ref 8.9–10.2)
Carbon Dioxide, Total: 27 meq/L (ref 22–32)
Chloride: 100 meq/L (ref 98–108)
Creatinine: 0.74 mg/dL (ref 0.51–1.18)
GFR, Calc, African American: >60 mL/min/{1.73_m2}
GFR, Calc, European American: >60 mL/min/{1.73_m2}
Glucose: 89 mg/dL (ref 62–125)
Hematocrit: 34 % — ABNORMAL LOW (ref 38–50)
Hemoglobin: 10.8 g/dL — ABNORMAL LOW (ref 13.0–18.0)
Immature Granulocytes: 0.18 10*3/uL — ABNORMAL HIGH (ref 0.00–0.05)
MCH: 30 pg (ref 27.3–33.6)
MCHC: 31.6 g/dL — ABNORMAL LOW (ref 32.2–36.5)
MCV: 95 fL (ref 81–98)
Magnesium: 1.9 mg/dL (ref 1.8–2.4)
Monocytes: 0.84 10*3/uL — ABNORMAL HIGH (ref 0.00–0.80)
Neutrophils: 9.27 10*3/uL — ABNORMAL HIGH (ref 1.80–7.00)
Nucleated RBC: 0 10*3/uL
Phosphate: 3.2 mg/dL (ref 2.5–4.5)
Platelet Count: 407 10*3/uL — ABNORMAL HIGH (ref 150–400)
Potassium: 4.1 meq/L (ref 3.6–5.2)
Protein (Total): 6.2 g/dL (ref 6.0–8.2)
RBC: 3.6 10*6/uL — ABNORMAL LOW (ref 4.40–5.60)
RDW-CV: 16.6 % — ABNORMAL HIGH (ref 11.6–14.4)
Sodium: 136 meq/L (ref 135–145)
Urea Nitrogen: 18 mg/dL (ref 8–21)
WBC: 12.33 10*3/uL — ABNORMAL HIGH (ref 4.3–10.0)

## 2015-10-16 LAB — PULMONARY FUNCTION TESTING
FEF25-75-%Pred-Pre: 103
FEF25-75-Pre: 3.3
FEV1-%Pred-Pre: 80
FEV1-Pre: 2.71
FEV1/FVC-Pre: 89
FVC-%Pred-Pre: 70
FVC-Pre: 3.03

## 2015-10-16 LAB — TACROLIMUS BY LCMSMS: Tacrolimus By LCMSMS: 10.8 ng/mL

## 2015-10-16 MED ORDER — OXYCODONE HCL 5 MG OR TABS
5.0000 mg | ORAL_TABLET | Freq: Four times a day (QID) | ORAL | Status: DC | PRN
Start: 2015-10-16 — End: 2015-10-30

## 2015-10-16 NOTE — Progress Notes (Signed)
[place] PULMONARY POST-TRANSPLANT VISIT  DATE:  10/16/15    ID:  Mr. 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 His initial course included delirium with a period of hallucinations, but otherwise fairly uncomplicated and he was discharged 2/15. He presented shortly thereafter with a sternal dehiscence complicated by PTX/pneumomediastinum, and was re-admitted. Initially underwent chest tube and pigtail placement placement, and to the OR for sternal plating and re-wiring on 2/23. He remained hospitalized until 2/26 and now presents for his first follow up visit.    He has been doing reasonably well since discharge. He did take a step backwards with the readmission, and feels somewhat more reluctant to exercise for fear of sternal complications. He denies respiratory symptoms but has noticed more weakness since discharge. Specifically denies SOB, cough/sputum, sinonasal complaints, n/v/diarrhea, f/c/ns, edema.     Has had some mild heartburn symptoms, but seem to be well controlled with PPI.    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  Acetaminophen 325 MG Oral Tab, Take 650 mg by mouth every 6 hours as needed for pain., Disp: , Rfl:   .  Aspirin 81 MG Oral Tab, Take 81 mg by mouth daily., Disp: , Rfl:   .  Azithromycin 500 MG Oral Tab, Take 1 tablet (500 mg) by mouth daily., Disp: 30 tablet, Rfl: 3  .  Calcium Carbonate 500 MG Oral Chew Tab, Chew and swallow 1,000 mg by mouth 2 times a day., Disp: , Rfl:   .  Cholecalciferol 1000 units Oral Tab,  Take 1,000 Units by mouth daily., Disp: , Rfl:   .  Clotrimazole 10 MG Mouth/Throat Troche, , Disp: , Rfl:   .  Docusate Sodium 250 MG Oral Cap, Take 250 mg by mouth 2 times a day., Disp: , Rfl:   .  Ethambutol HCl 400 MG Oral Tab, Take 3 tablets (1,200 mg) by mouth daily., Disp: 90 tablet, Rfl: 3  .  Isoniazid 300 MG Oral Tab, Take 1 tablet (300 mg) by mouth daily., Disp: 30 tablet, Rfl: 3  .  Magnesium Oxide 400 MG Oral Tab, Take 400 mg by mouth 2 times a day., Disp: , Rfl:   .  Moxifloxacin HCl 400 MG Oral Tab, Take 1 tablet (400 mg) by mouth daily., Disp: 30 tablet, Rfl: 3  .  Multiple Vitamins-Minerals (MULTIVITAMIN & MINERAL OR), Take 1 tablet by mouth daily., Disp: , Rfl:   .  Mycophenolate Mofetil 500 MG Oral Tab, Take 1,000 mg by mouth 2 times a day., Disp: , Rfl:   .  OxyCODONE HCl 5 MG Oral Tab, Take 1 tablet (5 mg) by mouth every 6 hours as needed for pain., Disp: 60 tablet, Rfl: 0  .  Pantoprazole Sodium 40 MG Oral Tab EC, Take 40 mg by mouth daily., Disp: , Rfl:   .  Polyethylene Glycol 3350 Oral Powder, Take 17 g by mouth daily. Fill cap with powder to the  17 gram mark and dissolve in 4 to 8 ounces of water., Disp: , Rfl:   .  Polyethylene Glycol 3350 Powder, Take 17 g by mouth 2 times a day as needed (constipation). Dissolve 1 heaping tablespoonful in 8 ounces of juice or water and drink., Disp: 255 g, Rfl: 3  .  Pravastatin Sodium (PRAVACHOL) 20 MG Oral Tab, Take 20 mg by mouth every evening., Disp: , Rfl:   .  PredniSONE 5 MG Oral Tab, Per taper, Disp: , Rfl:   .  PYRIDOXINE HCL ER OR, , Disp: , Rfl:   .  Sennosides (SENNA) 8.6 MG Oral Cap, Take 17.2 mg by mouth at bedtime., Disp: , Rfl:   .  Sulfamethoxazole-Trimethoprim 400-80 MG Oral Tab, Take 1 tablet by mouth daily., Disp: , Rfl:   .  Tacrolimus 1 MG Oral Cap, Take 6 mg by mouth every 12 hours. , Disp: , Rfl:   .  ValGANciclovir HCl 450 MG Oral Tab, Take 900 mg by mouth daily., Disp: , Rfl:   .  Venlafaxine HCl 100 MG Oral Tab, Take 100 mg by  mouth 2 times a day., Disp: , Rfl:     ALLERGIES:  Codeine - anxiety/dyspnea    SH  Lives in [place].    PHYSICAL EXAM  Filed Vitals:    10/16/15 0946   BP: 152/86   Pulse: 102   Temp: 97.7 F (36.5 C)   TempSrc: Temporal   Height: 5' 8.5" (1.74 m)   Weight: 144 lb (65.318 kg)   SpO2: 100%     Body mass index is 21.57 kg/(m^2).  General: thin older man, comfortable appearing, appears stated age, NAD  HEENT: PERRL, moist mucous membranes, no mucosal lesions  Neck: Without LAD, no thyromegaly  Lungs: Chest is normal to appearance with a well healed incision, symmetric expansion, tympanic to percussion. No wheezes, crackles, or rhonci.  Heart: RRR, Nl S1/S2, without murmurs, gallops, rubs  Abdomen: Nondistended, +BS, soft, nontender, no guarding/rebound, no hepatosplenomegaly  Extremities: WWP, no clubbing or cyanosis, no edema  Skin: No rashes or lesions  Neuro: Strength normal and symmetric  Psych: Normal affect, conversant    DATA:    Labs   All were reviewed, most notably -   Cr 0.7, others stable    CXR was reviewed by me and revealed no acute disease or changes    Spirometry  R FVC % FEV1 % 2575 %  10/16/2015  [place]   89   3.03   70%  2.71   80%  3.3   103%                    09/23/2015  [place]   82   3.32   77%  2.73   81%  2.72   85%                    09/20/2015  [place]   85   3.26   75%  2.78   82%  2.76   87%                   Interpretation - near normal, slight decline since last time but this is first post-discharge spirometry    IMPRESSION/PLAN:  65 year old man 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 Lung transplant - Doing fairly well considering his recent re-hospitalization for sternal complication   -  IS - FK (8-10), MMF 1000q12, pred per taper   - PPX - Bactrim indefinitely, clotrimazole until pred 10, valcyte until ~4/26   - Strict sternal precautions   - For deconditioning and to regain confidence with exercise, start pulm rehab (in [place])    2. M.  Xenopi on explant   - azithromycin 500mg  QD, moxifloxacin 400mg  qd, INH 300mg  qd, ethambutol 1200mg  qd, B6   - At least 3 months treatment, may need optho follow up    3. 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 Follow up 2 weeks

## 2015-10-21 ENCOUNTER — Encounter (HOSPITAL_BASED_OUTPATIENT_CLINIC_OR_DEPARTMENT_OTHER): Payer: Self-pay | Admitting: Thoracic Surgery (Cardiothoracic Vascular Surgery)

## 2015-10-21 ENCOUNTER — Ambulatory Visit: Payer: Medicare Other | Attending: Transplant | Admitting: Thoracic Surgery (Cardiothoracic Vascular Surgery)

## 2015-10-21 VITALS — BP 97/58 | HR 97 | Temp 97.7°F | Ht 68.5 in | Wt 147.9 lb

## 2015-10-21 DIAGNOSIS — Z4824 Encounter for aftercare following lung transplant: Secondary | ICD-10-CM | POA: Insufficient documentation

## 2015-10-21 DIAGNOSIS — Z942 Lung transplant status: Secondary | ICD-10-CM

## 2015-10-21 DIAGNOSIS — R918 Other nonspecific abnormal finding of lung field: Secondary | ICD-10-CM

## 2015-10-21 NOTE — Progress Notes (Signed)
[place]     Attending Physician: Vista Deck, MD    Patient Identification  Mr. 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 He presents today for his postoperative follow-up visit.    HPI  Mr. Cropper underwent lung transplant on 09/05/15.  His post-operative course was notable for delirium and hallucinations.  He was discharged on 09/25/15.  He was re-admitted with a sternal dehiscence complicated by pneumothorax and pneumomediastinum.  He initially underwent placement of a chest tube and pigtail catheter.  He underwent sternal rewiring and plating on 10/03/15.  He was discharged on 10/06/15.  Today he states that he has been doing well since discharge.  He notes continued fatigue and weakness but feels that this is improving.  He complains of incisional pain which is improved with pain medications.  He denies popping/clicking sensations.    Review of Systems  Pertinent positive information reviewed above. He denies headache, fever, chills, night sweats or dizziness. No change in appetite, nausea, vomiting, or diarrhea. He denies wheezing, worsening shortness of breath, chest or incisional pain. He denies palpitations, numbness, tingling, dysuria, hematuria or urinary frequency.    Physical Exam  BP 97/58 mmHg  Pulse 97  Temp(Src) 97.7 F (36.5 C) (Temporal)  Ht 5' 8.5" (1.74 m)  Wt 147 lb 14.4 oz (67.087 kg)  BMI 22.16 kg/m2  SpO2 98%  Physical Exam   Constitutional: He is oriented to person, place, and time and well-developed, well-nourished, and in no distress.   HENT:   Head: Normocephalic and atraumatic.   Eyes: Conjunctivae are normal. Pupils are equal, round, and reactive to light.   Neck: Normal range of motion. Neck supple.   Cardiovascular: Normal rate, regular rhythm and normal heart sounds.    Pulmonary/Chest: Effort normal and breath sounds normal.    Incision CDI, steri strips in place.  Sutures in place at prior drain sites.  No evidence of sternal instability   Abdominal: Soft. Bowel sounds are normal.   Musculoskeletal: Normal range of motion.   Neurological: He is alert and oriented to person, place, and time. No cranial nerve deficit. Gait normal.   Skin: Skin is warm and dry.   Psychiatric: Mood, memory, affect and judgment normal.     Xray/Diagnostic Studies  10/21/15 Chest xray: No acute cardiopulmonary abnormalities. Stable blunting of both posterior lateral costophrenic angles. Stable mild atelectasis or scarring left lower lobe. Otherwise clear lungs. Minimal expected postsurgical changes.     Assessment/Plan  Mr. Shostak is a 65 year old male who is status post lung transplant on  for 09/05/15 and more recently, sternal rewiring and plating on 10/03/15 for treatment of sternal dehiscence..  Overall, he is doing well after his surgery. His incisions are healing well.  His steri strips and sutures were removed and wound care was reviewed.  His pain remains well controlled on decreasing amounts of narcotic pain medication and weaning was discussed today.  He will continue with his daily walking.  We discussed that his fatigue should improve with time. At this time we are recommending he follow up with Dr. Janyce Llanos as needed. He will follow up with pulmonary transplant as scheduled.  The patient demonstrates understanding of this plan and knows to contact our office with any further questions or concerns.     I spent approximately a total of 15 minutes face to face with the  patient, of which more than 50% was spent counseling and/or coordinating the patient's care as outlined in this note.

## 2015-10-23 ENCOUNTER — Other Ambulatory Visit (HOSPITAL_BASED_OUTPATIENT_CLINIC_OR_DEPARTMENT_OTHER): Payer: Self-pay | Admitting: Internal Medicine

## 2015-10-23 DIAGNOSIS — Z942 Lung transplant status: Secondary | ICD-10-CM

## 2015-10-23 DIAGNOSIS — Z48298 Encounter for aftercare following other organ transplant: Secondary | ICD-10-CM

## 2015-10-30 ENCOUNTER — Ambulatory Visit (HOSPITAL_BASED_OUTPATIENT_CLINIC_OR_DEPARTMENT_OTHER): Payer: Medicare Other | Admitting: Psychiatry

## 2015-10-30 ENCOUNTER — Encounter (HOSPITAL_BASED_OUTPATIENT_CLINIC_OR_DEPARTMENT_OTHER): Payer: Self-pay | Admitting: Psychiatry

## 2015-10-30 ENCOUNTER — Other Ambulatory Visit (HOSPITAL_COMMUNITY): Payer: Self-pay

## 2015-10-30 ENCOUNTER — Encounter (HOSPITAL_BASED_OUTPATIENT_CLINIC_OR_DEPARTMENT_OTHER): Payer: Self-pay | Admitting: Transplant

## 2015-10-30 ENCOUNTER — Ambulatory Visit: Payer: Medicare Other | Attending: Internal Medicine | Admitting: Transplant

## 2015-10-30 ENCOUNTER — Ambulatory Visit (HOSPITAL_BASED_OUTPATIENT_CLINIC_OR_DEPARTMENT_OTHER): Payer: Medicare Other

## 2015-10-30 ENCOUNTER — Other Ambulatory Visit (HOSPITAL_BASED_OUTPATIENT_CLINIC_OR_DEPARTMENT_OTHER): Payer: Self-pay | Admitting: Internal Medicine

## 2015-10-30 VITALS — BP 126/85 | HR 101 | Temp 98.1°F | Ht 68.5 in | Wt 149.7 lb

## 2015-10-30 DIAGNOSIS — F3341 Major depressive disorder, recurrent, in partial remission: Secondary | ICD-10-CM

## 2015-10-30 DIAGNOSIS — Z48298 Encounter for aftercare following other organ transplant: Secondary | ICD-10-CM

## 2015-10-30 DIAGNOSIS — Z942 Lung transplant status: Secondary | ICD-10-CM

## 2015-10-30 DIAGNOSIS — Z4824 Encounter for aftercare following lung transplant: Secondary | ICD-10-CM

## 2015-10-30 DIAGNOSIS — R5381 Other malaise: Secondary | ICD-10-CM | POA: Insufficient documentation

## 2015-10-30 DIAGNOSIS — F411 Generalized anxiety disorder: Secondary | ICD-10-CM | POA: Insufficient documentation

## 2015-10-30 DIAGNOSIS — A310 Pulmonary mycobacterial infection: Secondary | ICD-10-CM

## 2015-10-30 DIAGNOSIS — D899 Disorder involving the immune mechanism, unspecified: Secondary | ICD-10-CM | POA: Insufficient documentation

## 2015-10-30 DIAGNOSIS — R0789 Other chest pain: Secondary | ICD-10-CM | POA: Insufficient documentation

## 2015-10-30 DIAGNOSIS — D849 Immunodeficiency, unspecified: Secondary | ICD-10-CM

## 2015-10-30 DIAGNOSIS — J984 Other disorders of lung: Secondary | ICD-10-CM

## 2015-10-30 LAB — PULMONARY FUNCTION TESTING
FEF25-75-%Pred-Pre: 95
FEF25-75-Pre: 3.02
FEV1-%Pred-Pre: 81
FEV1-Pre: 2.76
FEV1/FVC-Pre: 84
FVC-%Pred-Pre: 76
FVC-Pre: 3.29

## 2015-10-30 LAB — TACROLIMUS BY LCMSMS: Tacrolimus By LCMSMS: 9.7 ng/mL

## 2015-10-30 MED ORDER — OXYCODONE HCL 5 MG OR TABS
5.0000 mg | ORAL_TABLET | Freq: Four times a day (QID) | ORAL | Status: DC | PRN
Start: 2015-10-30 — End: 2015-12-04

## 2015-10-30 NOTE — Patient Instructions (Signed)
--  set alarms for regular breathing exercises (at least every 2 hrs)  --confirmed adherence to medications  --Continue Venlafaxine at current dose; consider higher dose once pt becomes more physically stable   --clarified exercise schedule and pt has plan  --no follow up needed; prn

## 2015-10-30 NOTE — Progress Notes (Signed)
[name]  DATE:  10/30/15    ID:  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 Initial course included delirium/hallucinations, but otherwise uncomplicated and he was discharged 2/15. He presented shortly thereafter with a sternal dehiscence complicated by PTX/pneumomediastinum, requiring  OR 2/23 for sternal plating and re-wiring, after which discharged 2/26.    Last seen 2 weeks ago. He has been doing fairly well. Initially activity was very low, which he relates to some trepidation regarding his sternum, perhaps also a degree of depression and a hard time emotionally adjusting to post-transplant status. He missed his pulmonary rehab intake visit, but has rescheduled for April (next available). He is increasing his exercise, now walking 1/4-1/2 mile per day (albeit relatively slowly). Mild SOB is slowly improving. Pain is also improving.  Denies cough/sputum, f/c/ns, GERD, n/v/abd pain, LE edema.    ROS  - Cut his R thumb on a walnut shell but healing well  - A complete ROS was otherwise negative or as per hpi     PMH:  SP- BOLT 09/05/15 for COPD (CMV+/+, EBV+/+)   - Explant M. 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  Acetaminophen 325 MG Oral Tab, Take 650 mg by mouth every 6 hours as needed for pain., Disp: , Rfl:   .  Aspirin 81 MG Oral Tab, Take 81 mg by mouth daily., Disp: , Rfl:   .  Azithromycin 500 MG Oral Tab, Take 1 tablet (500 mg) by mouth daily., Disp: 30 tablet, Rfl: 3  .  Calcium Carbonate 500 MG Oral Chew Tab, Chew and swallow 1,000 mg by mouth 2 times a day., Disp: , Rfl:   .  Cholecalciferol 1000 units Oral Tab, Take  1,000 Units by mouth daily., Disp: , Rfl:   .  Clotrimazole 10 MG Mouth/Throat Troche, , Disp: , Rfl:   .  Docusate Sodium 250 MG Oral Cap, Take 250 mg by mouth 2 times a day., Disp: , Rfl:   .  Ethambutol HCl 400 MG Oral Tab, Take 3 tablets (1,200 mg) by mouth daily., Disp: 90 tablet, Rfl: 3  .  Isoniazid 300 MG Oral Tab, Take 1 tablet (300 mg) by mouth daily., Disp: 30 tablet, Rfl: 3  .  Magnesium Oxide 400 MG Oral Tab, Take 400 mg by mouth 2 times a day., Disp: , Rfl:   .  Moxifloxacin HCl 400 MG Oral Tab, Take 1 tablet (400 mg) by mouth daily., Disp: 30 tablet, Rfl: 3  .  Multiple Vitamins-Minerals (MULTIVITAMIN & MINERAL OR), Take 1 tablet by mouth daily., Disp: , Rfl:   .  Mycophenolate Mofetil 500 MG Oral Tab, Take 1,000 mg by mouth 2 times a day., Disp: , Rfl:   .  OxyCODONE HCl 5 MG Oral Tab, Take 1 tablet (5 mg) by mouth every 6 hours as needed for pain., Disp: 60 tablet, Rfl: 0  .  Pantoprazole Sodium 40 MG Oral Tab EC, Take 40 mg by mouth daily., Disp: , Rfl:   .  Polyethylene Glycol 3350 Oral Powder, Take 17 g by mouth daily. Fill cap  with powder to the 17 gram mark and dissolve in 4 to 8 ounces of water., Disp: , Rfl:   .  Polyethylene Glycol 3350 Powder, Take 17 g by mouth 2 times a day as needed (constipation). Dissolve 1 heaping tablespoonful in 8 ounces of juice or water and drink., Disp: 255 g, Rfl: 3  .  Pravastatin Sodium (PRAVACHOL) 20 MG Oral Tab, Take 20 mg by mouth every evening., Disp: , Rfl:   .  PredniSONE 5 MG Oral Tab, Per taper, Disp: , Rfl:   .  PYRIDOXINE HCL ER OR, , Disp: , Rfl:   .  Sennosides (SENNA) 8.6 MG Oral Cap, Take 17.2 mg by mouth at bedtime., Disp: , Rfl:   .  Sulfamethoxazole-Trimethoprim 400-80 MG Oral Tab, Take 1 tablet by mouth daily., Disp: , Rfl:   .  Tacrolimus 1 MG Oral Cap, Take 6 mg by mouth every 12 hours. , Disp: , Rfl:   .  TRAZODONE HCL OR, Take 25 mg by mouth., Disp: , Rfl:   .  ValGANciclovir HCl 450 MG Oral Tab, Take 900 mg by mouth daily., Disp: , Rfl:    .  Venlafaxine HCl 100 MG Oral Tab, Take 100 mg by mouth 2 times a day., Disp: , Rfl:     ALLERGIES:  Codeine - anxiety/dyspnea    SH  Lives in [place].    PHYSICAL EXAM  Filed Vitals:    10/30/15 1011   BP: 126/85   Pulse: 101   Temp: 98.1 F (36.7 C)   TempSrc: Temporal   Height: 5' 8.5" (1.74 m)   Weight: 149 lb 11.2 oz (67.903 kg)   SpO2: 96%     Body mass index is 22.43 kg/(m^2).  General: thin older man, comfortable appearing, appears stated age, NAD  HEENT: PERRL, moist mucous membranes, no mucosal lesions  Neck: Without LAD, no thyromegaly  Lungs: Chest is normal to appearance with a well healed incision, symmetric expansion, tympanic to percussion. No wheezes, crackles, or rhonci.  Heart: RRR, Nl S1/S2, without murmurs, gallops, rubs  Abdomen: Nondistended, +BS, soft, nontender, no guarding/rebound, no hepatosplenomegaly  Extremities: WWP, no clubbing or cyanosis, no edema  Skin: On his R first digit there is a 1 to 1.5 cm laceration that is healing well without signs of infection  Neuro: Strength normal and symmetric  Psych: Normal affect, conversant    DATA:    Labs   All were reviewed, most notably -   Cr 0.8, others stable    CXR was reviewed by me and revealed no acute disease or changes    Spirometry  R FVC % FEV1 % 2575 %  10/30/15   84 3.29 76 2.76 81 3.02 95  10/16/2015  [place]   89   3.03   70%  2.71   80%  3.3   103%                    09/23/2015  [place]   82   3.32   77%  2.73   81%  2.72   85%                    09/20/2015     85   3.26   75%  2.78   82%  2.76   87%                   Interpretation - near normal, improved from last visit  IMPRESSION/PLAN:  66 year old man 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 Lung transplant - Doing fairly well considering recent re-hospitalization for sternal complication. Limitations at this time are largely related to deconditioning.   - IS - FK (8-10), MMF 1000q12, pred per taper   - PPX - Bactrim  indefinitely, clotrimazole until pred 10, valcyte until ~4/26   - Strict sternal precautions   - We spent some time discussing the importance of re-conditioning, and he plans on re-enrolling in pulm rehab. First available visit is 4/20, but he'll also increase walking on his own   - No longer requires supplemental O2. Can cancel Rx.    2. M. Xenopi on explant   - azithromycin 500mg  QD, moxifloxacin 400mg  qd, INH 300mg  qd, ethambutol 1200mg  qd, B6   - At least 3 months treatment, may need optho follow up    3. 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 Follow up 3 weeks

## 2015-10-30 NOTE — Progress Notes (Signed)
INITIAL PSYCHIATRY CONSULTATION SERVICE NOTE    We were asked to see this patient by the transplant service to address the question of coping with transplant.     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   His initial course included delirium with a period of hallucinations but was able to be discharged on 09/25/15.  He experienced hallucinations of bugs and smoke with some paranoia that people were tampering with his medical equipment and may be causing him harm. He was prescribed haldol PRN by the consulting psychiatry service but never ended up receiving any and resolved independently. He subsequently underwent sternal rewiring and plating on 10/03/15 for sternal dehiscence.   On interview Dakota Marsh notes that a majority of the issues for which he is seeing me were resolved in a excellent conversation he had with Dakota Marsh over the phone.  He feels that a majority of the issues surrounded poor communication.  His version of the story is that Dakota Marsh his primary caretaker became overwhelmed by a number of stressors which included: her husband's mother passing, her own mother in a nursing home whom she provides care for, and the patient being in her home and requiring assistance.  Under the stresses Dakota Marsh communicated to Dakota Marsh (Dakota Marsh) that Dakota Marsh was not taking his medications, was lying on the couch, and doing nothing.  Dakota Marsh then called Dakota Marsh (Dakota Marsh) who then called Dakota Marsh.  By the time this came of telephone contact to Dakota Marsh, Dakota Marsh got the feeling that people felt he was on deaths doorstep.   His version of the actual events are as follows.  During his first hospitalization in which he received a lung transplant he  felt that his providers and support system were pushing him extremely hard.  He did not find this pressure helpful.  In this process of being pushed hard he felt that this may have led to some of his subsequent problems which included his dehiscence.  As result he felt he should listen more to his own body, but may have taken this too far.  More recently he has found a middle ground and has begun his physical activity with more rigger.  He is now walking at least half a mile each day.  Throughout this entire period he denies ever having missed a medication.  At most he delayed one dose of medication by an hour.  Dakota Marsh admits that he has not contributed significantly to the household in which she is staying.  He will often fix a meal for only himself but explains that no one else is eating at the time.  He knows that Dakota Marsh has complained about him not picking up his laundry, but explains that there had been baby chicks in the laundry room and wanted to avoid the respiratory risk.  He says that she Dakota Marsh complained of him not taking a shower every day but notes that he has never taken a shower every day throughout his entire life.  He admits that he has been deficient in his respiratory therapy "blowing machine" using it roughly 2-3 times per day when he knows he supposed to use it nearly hourly.   He now feels anxious in regard to his housing situation  and wonders if Dakota Marsh will allow him to stay with them.  As a backup he has a trailer that he hopes to get into working order that he could stay in.  In the last week he has been staying with his brother who lives in [place].  He says that this is not a long-term solution given his brothers own medical conditions and their inability to provide transportation.  He also notes that it has not been easy to live with other people since the death of his Marsh from early dementia.   Patient denies feeling persistently depressed.  At times he feels down but this is very  limited.  He is mostly feeling down because of his limitations.  Despite this he feels motivated and excited to get back to hunting and being in [place].  He has limited problems with sleep, energy, appetite, and psychomotor retardation which are difficult to differentiate from his physical illness.  He has mild thoughts of feeling like a failure.  Patient denies any suicidal thoughts.  Overall patient does not feel that his depression is worse than before his lung transplant.   Patient has a long history of anxiety for which she takes venlafaxine with good effect.  He notes past and present symptoms of excessive worry that tends to bleed into other realms.  He has difficulty relaxing and sitting still.  More recently he has noticed a new symptom of bouncing his leg that is entirely separate from his tremulousness that he attributes to prednisone and tacrolimus.  He has some anticipatory anxiety and worries that something bad might happen medically.  He endorses having slight panic attacks but denies physiologic symptoms associated with these.  He believes his anxiety has improved since prior to transplant.   Patient experienced delirium in the hospital but denies any current auditory or visual hallucinations.  He denies any paranoia or concerns that individuals are tampering with his medical equipment or health.  Patient is no longer taking trazodone.  Patient denies complete review of manic symptoms.      PHQ-9 = 8/27 screen for depression which indicates a mild risk (2 for anhedonia)  GAD-7 =  11/21 screen for anxiety which indicates a moderate risk (2 on stopping worry, worry too much, relaxing, anticipation.)    PSYCH ROS:        psychosis- denies: AH, VH, thought insertion, ideas of reference, paranoia.             anxiety/PTSD- denies: history of trauma, hyperarousability, nightmares, flashbacks, avoidance    Past Psych Hx:   Inpatient: none ; had a consultation from [facility] consultation for delirium in  Feb 2017.  Outpatient: no psychiatrist, PCP prescribes  Meds: venlafaxine 100 mg BID (taking since 1999) and trazodone 25-50 mg QHS  Diagnoses: depression , anxiety  Suicide Attempts: denies    Substance History:  Nicotine: quit smoking and chew tobacco; smoked 1.5 PPD for 25 years. stopped smoking in 2005 then began chewing tobacco.  stopped chewing in April of 2016.  Etoh: "heavy use" before 2005, now rarely consumes alcohol. None since transplant  Marijuana: denies  Illicit drugs: denies    Social History:   born and raised: [place]. lived and worked in [place] for a period of time before returning to [place].  Housing:  currently lives in his own home in [place], [place], a small community 40 miles from [place].  Relationships:  a widower.  four adult children. Dakota Marsh lives in [place],  [place].   son in [place] and another son in [place].  other son is currently in prison.  two adult grandchildren. eager to return to the activities he enjoys especially fishing and hunting.  Education: 8th grade, GED  Financial:  worked in [employer] for most of his adult life. He retired due to his disability in 2005.  Legal Problems: denies  History of Violence: denies  History of Abuse: denies    Family History:   Pt denies any family history of psychiatric illness and/or substance dependence  Marsh - depression and anxiety; alcohol  Son - schizophrenia based on other son's opinion; "hard drugs"   Father - alcohol     Medical History:   Past Medical History   Diagnosis Date   . Pulmonary hypertension ([place])    . COPD (chronic obstructive pulmonary disease) ([place])    . Allergic rhinitis due to allergen    . Anxiety    . Asthma    . Depression    . Hypertension        Problem List:   does not have any pertinent problems on file.    ROS:   Constitutional: _ Gastrointestinal: _   Cardiovascular: _ Genitourinary: _   Respiratory: _ Ears/Mouth/Nose/Throat: _   Endocrine: _ Heme/Lymph: _   Neurological: _ Integumentary:  _   Eyes: _ Allergy/Immunologic: _   Musculoskeletal: _ [x]  Unless otherwise indicated, blank items are all negative     Medications:    Current outpatient prescriptions:   .  Acetaminophen 325 MG Oral Tab, Take 650 mg by mouth every 6 hours as needed for pain., Disp: , Rfl:   .  Aspirin 81 MG Oral Tab, Take 81 mg by mouth daily., Disp: , Rfl:   .  Azithromycin 500 MG Oral Tab, Take 1 tablet (500 mg) by mouth daily., Disp: 30 tablet, Rfl: 3  .  Calcium Carbonate 500 MG Oral Chew Tab, Chew and swallow 1,000 mg by mouth 2 times a day., Disp: , Rfl:   .  Cholecalciferol 1000 units Oral Tab, Take 1,000 Units by mouth daily., Disp: , Rfl:   .  Clotrimazole 10 MG Mouth/Throat Troche, , Disp: , Rfl:   .  Docusate Sodium 250 MG Oral Cap, Take 250 mg by mouth 2 times a day., Disp: , Rfl:   .  Ethambutol HCl 400 MG Oral Tab, Take 3 tablets (1,200 mg) by mouth daily., Disp: 90 tablet, Rfl: 3  .  Isoniazid 300 MG Oral Tab, Take 1 tablet (300 mg) by mouth daily., Disp: 30 tablet, Rfl: 3  .  Magnesium Oxide 400 MG Oral Tab, Take 400 mg by mouth 2 times a day., Disp: , Rfl:   .  Moxifloxacin HCl 400 MG Oral Tab, Take 1 tablet (400 mg) by mouth daily., Disp: 30 tablet, Rfl: 3  .  Multiple Vitamins-Minerals (MULTIVITAMIN & MINERAL OR), Take 1 tablet by mouth daily., Disp: , Rfl:   .  Mycophenolate Mofetil 500 MG Oral Tab, Take 1,000 mg by mouth 2 times a day., Disp: , Rfl:   .  OxyCODONE HCl 5 MG Oral Tab, Take 1 tablet (5 mg) by mouth every 6 hours as needed for pain., Disp: 60 tablet, Rfl: 0  .  Pantoprazole Sodium 40 MG Oral Tab EC, Take 40 mg by mouth daily., Disp: , Rfl:   .  Polyethylene Glycol 3350 Oral Powder, Take 17 g by mouth daily. Fill cap with powder to the 17 gram mark and  dissolve in 4 to 8 ounces of water., Disp: , Rfl:   .  Polyethylene Glycol 3350 Powder, Take 17 g by mouth 2 times a day as needed (constipation). Dissolve 1 heaping tablespoonful in 8 ounces of juice or water and drink., Disp: 255 g, Rfl: 3  .   Pravastatin Sodium (PRAVACHOL) 20 MG Oral Tab, Take 20 mg by mouth every evening., Disp: , Rfl:   .  PredniSONE 5 MG Oral Tab, Per taper, Disp: , Rfl:   .  PYRIDOXINE HCL ER OR, , Disp: , Rfl:   .  Sennosides (SENNA) 8.6 MG Oral Cap, Take 17.2 mg by mouth at bedtime., Disp: , Rfl:   .  Sulfamethoxazole-Trimethoprim 400-80 MG Oral Tab, Take 1 tablet by mouth daily., Disp: , Rfl:   .  Tacrolimus 1 MG Oral Cap, Take 6 mg by mouth every 12 hours. , Disp: , Rfl:   .  TRAZODONE HCL OR, Take 25 mg by mouth., Disp: , Rfl:   .  ValGANciclovir HCl 450 MG Oral Tab, Take 900 mg by mouth daily., Disp: , Rfl:   .  Venlafaxine HCl 100 MG Oral Tab, Take 100 mg by mouth 2 times a day., Disp: , Rfl:     Allergies  Review of patient's allergies indicates:  Allergies   Allergen Reactions   . Codeine      ANXIETY/DYSPNEA       Mental Status Examination  Appearance: white male, male pattern baldness and mustache, appears stated age, appropriate hygiene and grooming  Behavior/Activity: no psychomotor agitation/retardation, appropriate eye contact  Speech: reg rate, rhythm, volume, prosody  Thought Form: linear, logical,  goal directed   Thought Content: denies auditory and visual hallucinations, denies paranoia  Marsh: "flustrated"   Affect: anxious, euthymic. Full range, smiling throughout appropriately.   Suicidal Ideation: denies  Orientation: fully oriented  Memory: intact to recent events  Judgment/Insight: fair/good  Attention/Concentration: good    Lab Findings:   Results for orders placed or performed during the hospital encounter of 09/30/15   CBC (HEMOGRAM)   Result Value Ref Range    WBC 12.38 (H) 4.3 - 10.0 10*3/uL    RBC 3.02 (L) 4.40 - 5.60 10*6/uL    Hemoglobin 9.0 (L) 13.0 - 18.0 g/dL    Hematocrit 28 (L) 38 - 50 %    MCV 94 81 - 98 fL    MCH 29.8 27.3 - 33.6 pg    MCHC 31.8 (L) 32.2 - 36.5 g/dL    Platelet Count 341 150 - 400 10*3/uL    RDW-CV 16.8 (H) 11.6 - 14.4 %     Results for orders placed or performed during the  hospital encounter of [id number]   BASIC METABOLIC PANEL   Result Value Ref Range    Sodium 133 (L) 135 - 145 meq/L    Potassium 4.5 3.6 - 5.2 meq/L    Chloride 100 98 - 108 meq/L    Carbon Dioxide, Total 26 22 - 32 meq/L    Anion Gap 7 4 - 12    Glucose 99 62 - 125 mg/dL    Urea Nitrogen 28 (H) 8 - 21 mg/dL    Creatinine 0.65 0.51 - 1.18 mg/dL    Calcium 8.5 (L) 8.9 - 10.2 mg/dL    GFR, Calc, European American >60 mL/min/[1.73_m2]    GFR, Calc, African American >60 mL/min/[1.73_m2]    GFR, Information       Calculated GFR in mL/min/1.73 m2 by MDRD equation.  Inaccurate with changing renal function.  See [email or web address]     No results found for this or any previous visit.  No results found for this or any previous visit.    EKG on 09/30/15 with QTc = 431      Assessment/Medical Decision Making    Mr. 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  The patient seems to have completely resolved from his delirium with no subsequent sequelae.  Although he does continue to have symptoms of depression and anxiety seem to be in alignment with this prior function and severity.  It is hard to attribute any nonadherence in regard to his physical activity to his psychiatric condition at this time.  At this time I would recommend continuing his current medication with no changes.  It may be worthwhile to increase his dose at a future time when he is physically more stable to treat remaining depressive and anxiety symptoms.   It sounds as though he experienced a slight rebellion after having followed the direction of his caretakers and despite this experience a dehiscence.  As a result he was likely too dismissive of his caretakers and allowed himself to become deconditioned.  His current housing situation seems complicated  and there is likely many different versions of the story.  Regardless, there seems to be an increased level of communication which hopefully will resolve many of these issues.  I encouraged the patient to speak more directly and ask for direct feedback in regard to what people expect of him.  In response I encouraged him to be very direct about what he is willing and not willing to do.  We discussed extensively his goals in life and linked his daily activities and exercise to accomplishing these goals.  In the appointment we set hourly alarms on his phone that will recur in order to remind him of his respiratory exercises and physical exercises.  He already had alarms set for his medications she has been adherent to.  There is no requirement for follow-up with me but I'm open to seeing this patient again if he would find it helpful.    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   Feel free to contact me with any questions.     Bryson Corona MD   Transplant Psychiatry  [phone]  [email or web address]

## 2015-10-31 ENCOUNTER — Encounter (HOSPITAL_BASED_OUTPATIENT_CLINIC_OR_DEPARTMENT_OTHER): Payer: Self-pay | Admitting: Transplant

## 2015-10-31 DIAGNOSIS — R5381 Other malaise: Secondary | ICD-10-CM | POA: Insufficient documentation

## 2015-10-31 LAB — LUNG TRX, POST OP EXTENDED PANEL
% Basophils: 0 %
% Eosinophils: 0 %
% Immature Granulocytes: 2 %
% Lymphocytes: 19 %
% Monocytes: 8 %
% Neutrophils: 71 %
% Nucleated RBC: 0 %
ALT (GPT): 19 U/L (ref 10–48)
AST (GOT): 19 U/L (ref 9–38)
Absolute Eosinophil Count: 0.01 10*3/uL (ref 0.00–0.50)
Absolute Lymphocyte Count: 1.84 10*3/uL (ref 1.00–4.80)
Albumin: 3.4 g/dL — ABNORMAL LOW (ref 3.5–5.2)
Alkaline Phosphatase (Total): 54 U/L (ref 36–161)
Anion Gap: 8 (ref 4–12)
Basophils: 0.02 10*3/uL (ref 0.00–0.20)
Bilirubin (Total): 0.2 mg/dL (ref 0.2–1.3)
CMV Quant Result: NOT DETECTED [IU]/mL
Calcium: 9 mg/dL (ref 8.9–10.2)
Carbon Dioxide, Total: 27 meq/L (ref 22–32)
Chloride: 103 meq/L (ref 98–108)
Creatinine: 0.79 mg/dL (ref 0.51–1.18)
GFR, Calc, African American: >60 mL/min/{1.73_m2}
GFR, Calc, European American: >60 mL/min/{1.73_m2}
Glucose: 103 mg/dL (ref 62–125)
Hematocrit: 32 % — ABNORMAL LOW (ref 38–50)
Hemoglobin: 10 g/dL — ABNORMAL LOW (ref 13.0–18.0)
Immature Granulocytes: 0.2 10*3/uL — ABNORMAL HIGH (ref 0.00–0.05)
MCH: 30.3 pg (ref 27.3–33.6)
MCHC: 31.3 g/dL — ABNORMAL LOW (ref 32.2–36.5)
MCV: 97 fL (ref 81–98)
Magnesium: 1.8 mg/dL (ref 1.8–2.4)
Monocytes: 0.74 10*3/uL (ref 0.00–0.80)
Neutrophils: 7.02 10*3/uL — ABNORMAL HIGH (ref 1.80–7.00)
Nucleated RBC: 0 10*3/uL
Phosphate: 3.5 mg/dL (ref 2.5–4.5)
Platelet Count: 313 10*3/uL (ref 150–400)
Potassium: 4.1 meq/L (ref 3.6–5.2)
Protein (Total): 6 g/dL (ref 6.0–8.2)
RBC: 3.3 10*6/uL — ABNORMAL LOW (ref 4.40–5.60)
RDW-CV: 16.7 % — ABNORMAL HIGH (ref 11.6–14.4)
Sodium: 138 meq/L (ref 135–145)
Urea Nitrogen: 22 mg/dL — ABNORMAL HIGH (ref 8–21)
WBC: 9.83 10*3/uL (ref 4.3–10.0)

## 2015-11-08 ENCOUNTER — Other Ambulatory Visit (HOSPITAL_BASED_OUTPATIENT_CLINIC_OR_DEPARTMENT_OTHER): Payer: Self-pay | Admitting: Critical Care Medicine

## 2015-11-08 DIAGNOSIS — Z942 Lung transplant status: Secondary | ICD-10-CM

## 2015-11-08 DIAGNOSIS — Z48298 Encounter for aftercare following other organ transplant: Secondary | ICD-10-CM

## 2015-11-11 LAB — AFB CULTURE W/STAIN
AFB Stain: NONE SEEN
AFB Stain: NONE SEEN
AFB Stain: NONE SEEN

## 2015-11-13 ENCOUNTER — Ambulatory Visit: Payer: Medicare Other | Attending: Critical Care Medicine | Admitting: Critical Care Medicine

## 2015-11-13 ENCOUNTER — Other Ambulatory Visit (HOSPITAL_COMMUNITY): Payer: Self-pay

## 2015-11-13 ENCOUNTER — Encounter (HOSPITAL_BASED_OUTPATIENT_CLINIC_OR_DEPARTMENT_OTHER): Payer: Self-pay

## 2015-11-13 ENCOUNTER — Other Ambulatory Visit (HOSPITAL_BASED_OUTPATIENT_CLINIC_OR_DEPARTMENT_OTHER): Payer: Self-pay | Admitting: Critical Care Medicine

## 2015-11-13 ENCOUNTER — Ambulatory Visit (HOSPITAL_BASED_OUTPATIENT_CLINIC_OR_DEPARTMENT_OTHER): Payer: Medicare Other

## 2015-11-13 VITALS — BP 139/83 | HR 94 | Temp 98.2°F | Ht 69.0 in | Wt 155.8 lb

## 2015-11-13 DIAGNOSIS — Z942 Lung transplant status: Secondary | ICD-10-CM

## 2015-11-13 DIAGNOSIS — Z4824 Encounter for aftercare following lung transplant: Secondary | ICD-10-CM

## 2015-11-13 DIAGNOSIS — Z48298 Encounter for aftercare following other organ transplant: Secondary | ICD-10-CM

## 2015-11-13 DIAGNOSIS — Z5189 Encounter for other specified aftercare: Secondary | ICD-10-CM

## 2015-11-13 LAB — PULMONARY FUNCTION TESTING
FEF25-75-%Pred-Pre: 66
FEF25-75-Pre: 2.13
FEV1-%Pred-Pre: 72
FEV1-Pre: 2.48
FEV1/FVC-Pre: 79
FVC-%Pred-Pre: 71
FVC-Pre: 3.13

## 2015-11-13 LAB — TACROLIMUS BY LCMSMS: Tacrolimus By LCMSMS: 8.6 ng/mL

## 2015-11-13 NOTE — Progress Notes (Signed)
LUNG TRANSPLANT HISTORY AND PHYSICAL    CC:  Post transplant follow up    HPI:  46M s/p BOLT 09/05/15 for COPD (CMV+/+, EBV+/+), returning to clinic for post-tx follow up.  Patient states he is in his usual state of health.  He reports that he has been checking his spirometry at home and has been doing well with it.  He is inquiring whether he is able to travel to [place].  Otherwise he denies cough, chest pain, dyspnea, orthopnea.  He is able to walk 3/4 of a mile at a time.    ROS:  A Full review of systems was performed and was negative except as noted in HPI    Medications:  Outpatient Prescriptions Prior to Visit   Medication Sig Dispense Refill   . Acetaminophen 325 MG Oral Tab Take 650 mg by mouth every 6 hours as needed for pain.     . Aspirin 81 MG Oral Tab Take 81 mg by mouth daily.     . Azithromycin 500 MG Oral Tab Take 1 tablet (500 mg) by mouth daily. 30 tablet 3   . Calcium Carbonate 500 MG Oral Chew Tab Chew and swallow 1,000 mg by mouth 2 times a day.     . Cholecalciferol 1000 units Oral Tab Take 1,000 Units by mouth daily.     . Clotrimazole 10 MG Mouth/Throat Troche      . Docusate Sodium 250 MG Oral Cap Take 250 mg by mouth 2 times a day.     . Ethambutol HCl 400 MG Oral Tab Take 3 tablets (1,200 mg) by mouth daily. 90 tablet 3   . Isoniazid 300 MG Oral Tab Take 1 tablet (300 mg) by mouth daily. 30 tablet 3   . Magnesium Oxide 400 MG Oral Tab Take 400 mg by mouth 2 times a day.     . Moxifloxacin HCl 400 MG Oral Tab Take 1 tablet (400 mg) by mouth daily. 30 tablet 3   . Multiple Vitamins-Minerals (MULTIVITAMIN & MINERAL OR) Take 1 tablet by mouth daily.     . Mycophenolate Mofetil 500 MG Oral Tab Take 1,000 mg by mouth 2 times a day.     . OxyCODONE HCl 5 MG Oral Tab Take 1 tablet (5 mg) by mouth every 6 hours as needed for pain. 90 tablet 0   . Pantoprazole Sodium 40 MG Oral Tab EC Take 40 mg by mouth daily.     . Polyethylene Glycol 3350 Oral Powder Take 17 g by mouth daily. Fill cap with powder  to the 17 gram mark and dissolve in 4 to 8 ounces of water.     . Pravastatin Sodium (PRAVACHOL) 20 MG Oral Tab Take 20 mg by mouth every evening.     . PredniSONE 5 MG Oral Tab Per taper     . PYRIDOXINE HCL ER OR      . Sennosides (SENNA) 8.6 MG Oral Cap Take 17.2 mg by mouth at bedtime.     . Sulfamethoxazole-Trimethoprim 400-80 MG Oral Tab Take 1 tablet by mouth daily.     . Tacrolimus 1 MG Oral Cap Take 6 mg by mouth every 12 hours.      . ValGANciclovir HCl 450 MG Oral Tab Take 900 mg by mouth daily.     . Venlafaxine HCl 100 MG Oral Tab Take 100 mg by mouth 2 times a day.       No facility-administered medications prior to visit.  Physical Exam:  Filed Vitals:    11/13/15 0958   BP: 139/83   Pulse: 94   Temp: 98.2 F (36.8 C)   TempSrc: Temporal   Height: 5\' 9"  (1.753 m)   Weight: 155 lb 12.8 oz (70.67 kg)   SpO2: 98%     Physical Exam   Constitutional: He is oriented to person, place, and time and well-developed, well-nourished, and in no distress. No distress.   HENT:   Head: Normocephalic and atraumatic.   Eyes: EOM are normal.   Cardiovascular: Normal rate, regular rhythm and normal heart sounds.  Exam reveals no gallop and no friction rub.    No murmur heard.  Pulmonary/Chest: Effort normal and breath sounds normal. No respiratory distress. He has no wheezes.   Neurological: He is alert and oriented to person, place, and time. Gait normal. GCS score is 15.   Skin: Skin is warm and dry. No rash noted. He is not diaphoretic.   Psychiatric: Mood and affect normal.     Labs:  Reviewed, CBC and BMP within normal limits and similar to prior.  Tacro and CMV pending    Imaging:  Reviewed.  CXR without evidence of acute abnormality, perhaps some slight bibasilar atelectasis    Studies:  11/13/15                                    79          3.13        71            2.48       72           2.13        66    3/22/[id number]  10/16/2015 [place] 89 3.03 70% 2.71 80% 3.3 103%         09/23/2015 [place] 82 3.32 77% 2.73 81% 2.72 85%         09/20/2015 [place] 85 3.26 75% 2.78 82% 2.76 87%        Interpretation - near normal, improved from last visit    ASSESSMENT AND PLAN:  59M s/p BOLT 09/05/15 for COPD (CMV+/+, EBV+/+), returning to clinic for post-tx follow up.     #Lung Allograft: doing well, without acute issues, however spirometry is mildly decreased.  If persists, would favor bronchoscopy to rule out rejection.  Patient currently on azithromycin.  -IS - FK (8-10), MMF 1000q12, pred per taper  - PPX - Bactrim indefinitely, clotrimazole until pred 10, valcyte until ~4/26, azithromycin daily (for M. 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

## 2015-11-13 NOTE — Patient Instructions (Signed)
Please do not fly at this time!    We will follow up with you in the future when it is safe to travel back to [place].

## 2015-11-14 LAB — LUNG TRX, POST OP EXTENDED PANEL
% Basophils: 0 %
% Eosinophils: 0 %
% Immature Granulocytes: 6 %
% Lymphocytes: 23 %
% Monocytes: 8 %
% Neutrophils: 63 %
% Nucleated RBC: 0 %
ALT (GPT): 18 U/L (ref 10–48)
AST (GOT): 18 U/L (ref 9–38)
Absolute Eosinophil Count: 0.03 10*3/uL (ref 0.00–0.50)
Absolute Lymphocyte Count: 2.33 10*3/uL (ref 1.00–4.80)
Albumin: 3.7 g/dL (ref 3.5–5.2)
Alkaline Phosphatase (Total): 45 U/L (ref 36–161)
Anion Gap: 8 (ref 4–12)
Basophils: 0.03 10*3/uL (ref 0.00–0.20)
Bilirubin (Total): 0.2 mg/dL (ref 0.2–1.3)
CMV Quant Result: NOT DETECTED [IU]/mL
Calcium: 8.8 mg/dL — ABNORMAL LOW (ref 8.9–10.2)
Carbon Dioxide, Total: 27 meq/L (ref 22–32)
Chloride: 103 meq/L (ref 98–108)
Creatinine: 0.74 mg/dL (ref 0.51–1.18)
GFR, Calc, African American: >60 mL/min/{1.73_m2}
GFR, Calc, European American: >60 mL/min/{1.73_m2}
Glucose: 96 mg/dL (ref 62–125)
Hematocrit: 31 % — ABNORMAL LOW (ref 38–50)
Hemoglobin: 9.7 g/dL — ABNORMAL LOW (ref 13.0–18.0)
Immature Granulocytes: 0.62 10*3/uL — ABNORMAL HIGH (ref 0.00–0.05)
MCH: 30 pg (ref 27.3–33.6)
MCHC: 31.2 g/dL — ABNORMAL LOW (ref 32.2–36.5)
MCV: 96 fL (ref 81–98)
Magnesium: 1.7 mg/dL — ABNORMAL LOW (ref 1.8–2.4)
Monocytes: 0.82 10*3/uL — ABNORMAL HIGH (ref 0.00–0.80)
Neutrophils: 6.11 10*3/uL (ref 1.80–7.00)
Nucleated RBC: 0 10*3/uL
Phosphate: 3.5 mg/dL (ref 2.5–4.5)
Platelet Count: 270 10*3/uL (ref 150–400)
Potassium: 4.2 meq/L (ref 3.6–5.2)
Protein (Total): 6.1 g/dL (ref 6.0–8.2)
RBC: 3.23 10*6/uL — ABNORMAL LOW (ref 4.40–5.60)
RDW-CV: 16 % — ABNORMAL HIGH (ref 11.6–14.4)
Sodium: 138 meq/L (ref 135–145)
Urea Nitrogen: 20 mg/dL (ref 8–21)
WBC: 9.94 10*3/uL (ref 4.3–10.0)

## 2015-11-19 NOTE — Progress Notes (Signed)
Attending Note:    I saw and evaluated Dakota Marsh with Dr. Jolene Schimke and I agree with his note. Clinically doing well. He does have a slight proportional drop in spirometry compared to last visit. No change in CXR. Will repeat spirometry at next visit. Will continue current immunosuppression.

## 2015-11-27 ENCOUNTER — Other Ambulatory Visit (HOSPITAL_BASED_OUTPATIENT_CLINIC_OR_DEPARTMENT_OTHER): Payer: Self-pay | Admitting: Internal Medicine

## 2015-11-27 DIAGNOSIS — Z48298 Encounter for aftercare following other organ transplant: Secondary | ICD-10-CM

## 2015-11-27 DIAGNOSIS — Z942 Lung transplant status: Secondary | ICD-10-CM

## 2015-11-27 MED ORDER — PANTOPRAZOLE SODIUM 40 MG OR TBEC
40.0000 mg | DELAYED_RELEASE_TABLET | Freq: Every day | ORAL | Status: DC
Start: 2015-11-27 — End: 2016-12-09

## 2015-12-02 ENCOUNTER — Other Ambulatory Visit (HOSPITAL_COMMUNITY): Payer: Self-pay

## 2015-12-04 ENCOUNTER — Other Ambulatory Visit (HOSPITAL_COMMUNITY): Payer: Self-pay

## 2015-12-04 ENCOUNTER — Other Ambulatory Visit (HOSPITAL_BASED_OUTPATIENT_CLINIC_OR_DEPARTMENT_OTHER): Payer: Self-pay | Admitting: Internal Medicine

## 2015-12-04 ENCOUNTER — Ambulatory Visit (HOSPITAL_BASED_OUTPATIENT_CLINIC_OR_DEPARTMENT_OTHER): Payer: Medicare Other

## 2015-12-04 ENCOUNTER — Ambulatory Visit: Payer: Medicare Other | Attending: Internal Medicine | Admitting: Critical Care Medicine

## 2015-12-04 ENCOUNTER — Encounter (HOSPITAL_BASED_OUTPATIENT_CLINIC_OR_DEPARTMENT_OTHER): Payer: Self-pay

## 2015-12-04 VITALS — BP 128/74 | HR 88 | Temp 98.4°F | Ht 69.0 in | Wt 156.0 lb

## 2015-12-04 DIAGNOSIS — D849 Immunodeficiency, unspecified: Secondary | ICD-10-CM

## 2015-12-04 DIAGNOSIS — D899 Disorder involving the immune mechanism, unspecified: Secondary | ICD-10-CM | POA: Insufficient documentation

## 2015-12-04 DIAGNOSIS — Z4824 Encounter for aftercare following lung transplant: Secondary | ICD-10-CM

## 2015-12-04 DIAGNOSIS — Z48298 Encounter for aftercare following other organ transplant: Secondary | ICD-10-CM | POA: Insufficient documentation

## 2015-12-04 DIAGNOSIS — Z942 Lung transplant status: Secondary | ICD-10-CM

## 2015-12-04 DIAGNOSIS — R942 Abnormal results of pulmonary function studies: Secondary | ICD-10-CM

## 2015-12-04 LAB — PULMONARY FUNCTION TESTING
FEF25-75-%Pred-Pre: 59
FEF25-75-Pre: 1.91
FEV1-%Pred-Pre: 77
FEV1-Pre: 2.62
FEV1/FVC-Pre: 73
FVC-%Pred-Pre: 82
FVC-Pre: 3.6

## 2015-12-04 LAB — TACROLIMUS BY LCMSMS: Tacrolimus By LCMSMS: 15.2 ng/mL

## 2015-12-04 MED ORDER — PREDNISONE 10 MG OR TABS
10.0000 mg | ORAL_TABLET | Freq: Every day | ORAL | Status: DC
Start: 2015-12-04 — End: 2016-01-15

## 2015-12-04 MED ORDER — TACROLIMUS 1 MG OR CAPS
4.0000 mg | ORAL_CAPSULE | Freq: Two times a day (BID) | ORAL | Status: DC
Start: 2015-12-04 — End: 2016-01-09

## 2015-12-04 NOTE — Patient Instructions (Addendum)
Stop Valganciclovir and clotrimazole troches    Stop the following medicines when you run out of them:  Azithromycin  Moxifloxacin  Isoniazid  Ethambutol  Pyridoxine

## 2015-12-04 NOTE — Progress Notes (Addendum)
CC:  Post transplant follow up    HPI:  46M s/p BOLT 09/05/15 for COPD (CMV+/+, EBV+/+), returning to clinic for post-tx follow up.He continues to do quite well. He is working outside more (wears a mask when doing so) and walking up to a mile at a time. Home spirometry has been stable. No cough or hemoptysis. Does note some "swelling" in chest and above his clavicles. No drainage from his wound. He is inquiring whether he is able to travel to [place].Appetitie is adequate, though he feels like he has to work at it to maintain weight.    ROS:  A Full review of systems was performed and was negative except as noted in HPI    Physical Exam:       BMI 23.0     Physical Exam   Constitutional: Well-nourished, and in no distress.   HENT:   Head: Normocephalic and atraumatic.   Eyes: anicteric  Neck:+ supraclavicular fat pads  Cardiovascular: Normal rate, regular rhythm and normal heart sounds. Exam reveals no gallop and no friction rub.   No murmur heard.  Pulmonary/Chest: Effort normal and breath sounds normal. No respiratory distress. He has no wheezes.   Neurological: He is alert and oriented to person, place, and time. Gait normal. GCS score is 15.   Skin: Skin is warm and dry. No rash noted. He is not diaphoretic.   Psychiatric: Mood and affect normal.     Labs:  Chem7 stable. Tacro and CMV pending    Imaging:  Reviewed. CXR without evidence of acute abnormality, perhaps some slight bibasilar atelectasis    Studies:    Spirometry:   FEV1   2.62  77%  FVC     3.60  82%  Ratio     .73       Interpretation - near normal, improved from last visit    ASSESSMENT AND PLAN:  46M s/p BOLT 09/05/15 for COPD (CMV+/+, EBV+/+), returning to clinic for post-tx follow up.     #Lung Allograft: doing well, without acute issues, however spirometry is mildly decreased. If persists, would favor bronchoscopy to rule out rejection. Patient currently on  azithromycin.  -IS - FK (8-10), MMF 1000q12, pred now at 10.  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    #HCM:  -mag oxide for FK induced hypomag  -asa/statin for CAD, cardiology f/u    RTC in 4 weeks

## 2015-12-05 ENCOUNTER — Other Ambulatory Visit (HOSPITAL_BASED_OUTPATIENT_CLINIC_OR_DEPARTMENT_OTHER): Payer: Self-pay | Admitting: Internal Medicine

## 2015-12-05 DIAGNOSIS — Z942 Lung transplant status: Secondary | ICD-10-CM

## 2015-12-05 DIAGNOSIS — Z48298 Encounter for aftercare following other organ transplant: Secondary | ICD-10-CM

## 2015-12-05 LAB — LUNG TRX, POST OP EXTENDED PANEL
% Basophils: 2 %
% Eosinophils: 0 %
% Immature Granulocytes: 4 %
% Lymphocytes: 22 %
% Monocytes: 9 %
% Neutrophils: 63 %
% Nucleated RBC: 0 %
ALT (GPT): 19 U/L (ref 10–48)
AST (GOT): 20 U/L (ref 9–38)
Absolute Eosinophil Count: 0 10*3/uL (ref 0.00–0.50)
Absolute Lymphocyte Count: 1.55 10*3/uL (ref 1.00–4.80)
Albumin: 4 g/dL (ref 3.5–5.2)
Alkaline Phosphatase (Total): 44 U/L (ref 36–161)
Anion Gap: 8 (ref 4–12)
Basophils: 0.14 10*3/uL (ref 0.00–0.20)
Bilirubin (Total): 0.3 mg/dL (ref 0.2–1.3)
CMV Quant Result: NOT DETECTED [IU]/mL
Calcium: 8.9 mg/dL (ref 8.9–10.2)
Carbon Dioxide, Total: 29 meq/L (ref 22–32)
Chloride: 102 meq/L (ref 98–108)
Creatinine: 0.92 mg/dL (ref 0.51–1.18)
GFR, Calc, African American: >60 mL/min/{1.73_m2}
GFR, Calc, European American: >60 mL/min/{1.73_m2}
Glucose: 94 mg/dL (ref 62–125)
Hematocrit: 32 % — ABNORMAL LOW (ref 38–50)
Hemoglobin: 10 g/dL — ABNORMAL LOW (ref 13.0–18.0)
Immature Granulocytes: 0.28 10*3/uL — ABNORMAL HIGH (ref 0.00–0.05)
MCH: 30 pg (ref 27.3–33.6)
MCHC: 31.1 g/dL — ABNORMAL LOW (ref 32.2–36.5)
MCV: 97 fL (ref 81–98)
Magnesium: 1.8 mg/dL (ref 1.8–2.4)
Monocytes: 0.63 10*3/uL (ref 0.00–0.80)
Neutrophils: 4.43 10*3/uL (ref 1.80–7.00)
Nucleated RBC: 0 10*3/uL
Phosphate: 3.8 mg/dL (ref 2.5–4.5)
Platelet Count: 260 10*3/uL (ref 150–400)
Potassium: 4.2 meq/L (ref 3.6–5.2)
Protein (Total): 6.2 g/dL (ref 6.0–8.2)
RBC: 3.33 10*6/uL — ABNORMAL LOW (ref 4.40–5.60)
RDW-CV: 13.9 % (ref 11.6–14.4)
Sodium: 139 meq/L (ref 135–145)
Urea Nitrogen: 24 mg/dL — ABNORMAL HIGH (ref 8–21)
WBC: 7.03 10*3/uL (ref 4.3–10.0)

## 2015-12-12 ENCOUNTER — Other Ambulatory Visit (INDEPENDENT_AMBULATORY_CARE_PROVIDER_SITE_OTHER): Payer: Medicare Other

## 2015-12-13 ENCOUNTER — Other Ambulatory Visit (INDEPENDENT_AMBULATORY_CARE_PROVIDER_SITE_OTHER): Payer: Medicare Other

## 2015-12-13 ENCOUNTER — Other Ambulatory Visit (HOSPITAL_BASED_OUTPATIENT_CLINIC_OR_DEPARTMENT_OTHER): Payer: Medicare Other | Admitting: Internal Medicine

## 2015-12-13 ENCOUNTER — Other Ambulatory Visit
Admit: 2015-12-13 | Discharge: 2015-12-13 | Disposition: A | Discharge status: Home / Self Care | Payer: Medicare Other | Attending: Internal Medicine | Admitting: Internal Medicine

## 2015-12-13 ENCOUNTER — Other Ambulatory Visit (HOSPITAL_COMMUNITY): Payer: Self-pay

## 2015-12-13 DIAGNOSIS — Z942 Lung transplant status: Secondary | ICD-10-CM | POA: Insufficient documentation

## 2015-12-13 DIAGNOSIS — Z5189 Encounter for other specified aftercare: Secondary | ICD-10-CM

## 2015-12-13 DIAGNOSIS — Z48298 Encounter for aftercare following other organ transplant: Secondary | ICD-10-CM

## 2015-12-13 LAB — BASIC METABOLIC PANEL
Anion Gap: 11 (ref 4–12)
Calcium: 9.1 mg/dL (ref 8.9–10.2)
Carbon Dioxide, Total: 26 meq/L (ref 22–32)
Chloride: 106 meq/L (ref 98–108)
Creatinine: 0.8 mg/dL (ref 0.51–1.18)
GFR, Calc, African American: >60 mL/min/{1.73_m2}
GFR, Calc, European American: >60 mL/min/{1.73_m2}
Glucose: 124 mg/dL (ref 62–125)
Potassium: 4.1 meq/L (ref 3.6–5.2)
Sodium: 143 meq/L (ref 135–145)
Urea Nitrogen: 14 mg/dL (ref 8–21)

## 2015-12-13 NOTE — Progress Notes (Signed)
COURTESY DRAW

## 2015-12-14 LAB — CMV PCR QUANT: CMV Quant Result: NOT DETECTED [IU]/mL

## 2015-12-14 LAB — TACROLIMUS BY LCMSMS: Tacrolimus By LCMSMS: 5.8 ng/mL

## 2015-12-16 ENCOUNTER — Other Ambulatory Visit (HOSPITAL_COMMUNITY): Payer: Self-pay

## 2015-12-16 ENCOUNTER — Other Ambulatory Visit (HOSPITAL_BASED_OUTPATIENT_CLINIC_OR_DEPARTMENT_OTHER): Payer: Self-pay | Admitting: Pulmonary Disease

## 2015-12-16 DIAGNOSIS — Z48298 Encounter for aftercare following other organ transplant: Secondary | ICD-10-CM

## 2015-12-16 DIAGNOSIS — Z942 Lung transplant status: Secondary | ICD-10-CM

## 2015-12-20 ENCOUNTER — Other Ambulatory Visit (HOSPITAL_COMMUNITY): Payer: Self-pay

## 2015-12-20 LAB — BASIC METABOLIC PANEL, EXTERNAL
Calcium, External: 9.1
Carbon Dioxide (Total), External: 25
Chloride, External: 104
Creatinine, External: 0.8
Glucose, External: 118
Potassium, External: 3.9
Sodium, External: 142
Urea Nitrogen, External: 19

## 2015-12-20 LAB — TACROLIMUS, EXTERNAL: Tacrolimus, External: 4.4

## 2015-12-20 LAB — CMV_PCR QUANT, EXTERNAL: CMV Quant Result, External: NOT DETECTED

## 2015-12-23 ENCOUNTER — Encounter (HOSPITAL_BASED_OUTPATIENT_CLINIC_OR_DEPARTMENT_OTHER): Payer: Self-pay

## 2015-12-23 ENCOUNTER — Other Ambulatory Visit (HOSPITAL_BASED_OUTPATIENT_CLINIC_OR_DEPARTMENT_OTHER): Payer: Self-pay | Admitting: Internal Medicine

## 2015-12-23 ENCOUNTER — Other Ambulatory Visit (HOSPITAL_COMMUNITY): Payer: Self-pay

## 2015-12-23 DIAGNOSIS — Z942 Lung transplant status: Secondary | ICD-10-CM

## 2015-12-23 DIAGNOSIS — Z48298 Encounter for aftercare following other organ transplant: Secondary | ICD-10-CM

## 2015-12-30 ENCOUNTER — Other Ambulatory Visit (HOSPITAL_BASED_OUTPATIENT_CLINIC_OR_DEPARTMENT_OTHER): Payer: Self-pay | Admitting: Internal Medicine

## 2015-12-30 ENCOUNTER — Other Ambulatory Visit
Admit: 2015-12-30 | Discharge: 2015-12-30 | Disposition: A | Discharge status: Home / Self Care | Payer: Medicare Other | Attending: Internal Medicine | Admitting: Internal Medicine

## 2015-12-30 ENCOUNTER — Other Ambulatory Visit (HOSPITAL_COMMUNITY): Payer: Self-pay

## 2015-12-30 ENCOUNTER — Other Ambulatory Visit (INDEPENDENT_AMBULATORY_CARE_PROVIDER_SITE_OTHER): Payer: Medicare Other

## 2015-12-30 DIAGNOSIS — Z4824 Encounter for aftercare following lung transplant: Secondary | ICD-10-CM

## 2015-12-30 DIAGNOSIS — Z942 Lung transplant status: Secondary | ICD-10-CM

## 2015-12-30 DIAGNOSIS — Z48298 Encounter for aftercare following other organ transplant: Secondary | ICD-10-CM

## 2015-12-30 LAB — CBC, DIFF
% Basophils: 1 %
% Eosinophils: 1 %
% Immature Granulocytes: 7 %
% Lymphocytes: 22 %
% Monocytes: 13 %
% Neutrophils: 56 %
% Nucleated RBC: 0 %
Absolute Eosinophil Count: 0.06 10*3/uL (ref 0.00–0.50)
Absolute Lymphocyte Count: 1.8 10*3/uL (ref 1.00–4.80)
Basophils: 0.05 10*3/uL (ref 0.00–0.20)
Hematocrit: 36 % — ABNORMAL LOW (ref 38–50)
Hemoglobin: 10.9 g/dL — ABNORMAL LOW (ref 13.0–18.0)
Immature Granulocytes: 0.54 10*3/uL — ABNORMAL HIGH (ref 0.00–0.05)
MCH: 29.5 pg (ref 27.3–33.6)
MCHC: 30.2 g/dL — ABNORMAL LOW (ref 32.2–36.5)
MCV: 98 fL (ref 81–98)
Monocytes: 1.08 10*3/uL — ABNORMAL HIGH (ref 0.00–0.80)
Neutrophils: 4.62 10*3/uL (ref 1.80–7.00)
Nucleated RBC: 0 10*3/uL
Platelet Count: 343 10*3/uL (ref 150–400)
RBC: 3.7 10*6/uL — ABNORMAL LOW (ref 4.40–5.60)
RDW-CV: 13.2 % (ref 11.6–14.4)
WBC: 8.15 10*3/uL (ref 4.3–10.0)

## 2015-12-30 LAB — BASIC METABOLIC PANEL
Anion Gap: 10 (ref 4–12)
Calcium: 9.2 mg/dL (ref 8.9–10.2)
Carbon Dioxide, Total: 27 meq/L (ref 22–32)
Chloride: 104 meq/L (ref 98–108)
Creatinine: 0.86 mg/dL (ref 0.51–1.18)
GFR, Calc, African American: >60 mL/min/{1.73_m2}
GFR, Calc, European American: >60 mL/min/{1.73_m2}
Glucose: 90 mg/dL (ref 62–125)
Potassium: 4.4 meq/L (ref 3.6–5.2)
Sodium: 141 meq/L (ref 135–145)
Urea Nitrogen: 23 mg/dL — ABNORMAL HIGH (ref 8–21)

## 2015-12-31 LAB — CMV PCR QUANT: CMV Quant Result: NOT DETECTED [IU]/mL

## 2015-12-31 LAB — TACROLIMUS BY LCMSMS: Tacrolimus By LCMSMS: 5.8 ng/mL

## 2016-01-01 ENCOUNTER — Other Ambulatory Visit (HOSPITAL_BASED_OUTPATIENT_CLINIC_OR_DEPARTMENT_OTHER): Payer: Self-pay | Admitting: Internal Medicine

## 2016-01-01 DIAGNOSIS — Z48298 Encounter for aftercare following other organ transplant: Secondary | ICD-10-CM

## 2016-01-01 DIAGNOSIS — Z942 Lung transplant status: Secondary | ICD-10-CM

## 2016-01-08 ENCOUNTER — Ambulatory Visit (HOSPITAL_BASED_OUTPATIENT_CLINIC_OR_DEPARTMENT_OTHER): Payer: Medicare Other

## 2016-01-08 ENCOUNTER — Encounter (HOSPITAL_BASED_OUTPATIENT_CLINIC_OR_DEPARTMENT_OTHER): Payer: Self-pay

## 2016-01-08 ENCOUNTER — Other Ambulatory Visit (HOSPITAL_BASED_OUTPATIENT_CLINIC_OR_DEPARTMENT_OTHER): Payer: Self-pay | Admitting: Internal Medicine

## 2016-01-08 ENCOUNTER — Other Ambulatory Visit (HOSPITAL_COMMUNITY): Payer: Self-pay

## 2016-01-08 ENCOUNTER — Other Ambulatory Visit: Payer: Self-pay | Admitting: Transplant

## 2016-01-08 ENCOUNTER — Ambulatory Visit: Payer: Medicare Other | Attending: Transplant | Admitting: Transplant

## 2016-01-08 VITALS — BP 118/77 | HR 86 | Temp 98.4°F | Ht 69.0 in | Wt 163.0 lb

## 2016-01-08 DIAGNOSIS — R5381 Other malaise: Secondary | ICD-10-CM | POA: Insufficient documentation

## 2016-01-08 DIAGNOSIS — Z5189 Encounter for other specified aftercare: Secondary | ICD-10-CM

## 2016-01-08 DIAGNOSIS — Z942 Lung transplant status: Secondary | ICD-10-CM

## 2016-01-08 DIAGNOSIS — Z48298 Encounter for aftercare following other organ transplant: Secondary | ICD-10-CM | POA: Insufficient documentation

## 2016-01-08 DIAGNOSIS — D849 Immunodeficiency, unspecified: Secondary | ICD-10-CM

## 2016-01-08 DIAGNOSIS — M7989 Other specified soft tissue disorders: Secondary | ICD-10-CM

## 2016-01-08 DIAGNOSIS — T86818 Other complications of lung transplant: Secondary | ICD-10-CM

## 2016-01-08 DIAGNOSIS — Z4824 Encounter for aftercare following lung transplant: Secondary | ICD-10-CM

## 2016-01-08 DIAGNOSIS — D899 Disorder involving the immune mechanism, unspecified: Secondary | ICD-10-CM | POA: Insufficient documentation

## 2016-01-08 DIAGNOSIS — R601 Generalized edema: Secondary | ICD-10-CM | POA: Insufficient documentation

## 2016-01-08 LAB — LUNG TRX, POST OP EXTENDED PANEL
% Basophils: 1 %
% Eosinophils: 1 %
% Immature Granulocytes: 4 %
% Lymphocytes: 20 %
% Monocytes: 13 %
% Neutrophils: 61 %
% Nucleated RBC: 0 %
ALT (GPT): 11 U/L (ref 10–48)
AST (GOT): 17 U/L (ref 9–38)
Absolute Eosinophil Count: 0.06 10*3/uL (ref 0.00–0.50)
Absolute Lymphocyte Count: 1.64 10*3/uL (ref 1.00–4.80)
Albumin: 3.6 g/dL (ref 3.5–5.2)
Alkaline Phosphatase (Total): 60 U/L (ref 36–161)
Anion Gap: 6 (ref 4–12)
Basophils: 0.04 10*3/uL (ref 0.00–0.20)
Bilirubin (Total): 0.2 mg/dL (ref 0.2–1.3)
CMV Quant Result: NOT DETECTED [IU]/mL
Calcium: 9.1 mg/dL (ref 8.9–10.2)
Carbon Dioxide, Total: 30 meq/L (ref 22–32)
Chloride: 105 meq/L (ref 98–108)
Creatinine: 1.02 mg/dL (ref 0.51–1.18)
GFR, Calc, African American: >60 mL/min/{1.73_m2}
GFR, Calc, European American: >60 mL/min/{1.73_m2}
Glucose: 95 mg/dL (ref 62–125)
Hematocrit: 34 % — ABNORMAL LOW (ref 38–50)
Hemoglobin: 10.8 g/dL — ABNORMAL LOW (ref 13.0–18.0)
Immature Granulocytes: 0.29 10*3/uL — ABNORMAL HIGH (ref 0.00–0.05)
MCH: 29.7 pg (ref 27.3–33.6)
MCHC: 31.4 g/dL — ABNORMAL LOW (ref 32.2–36.5)
MCV: 95 fL (ref 81–98)
Magnesium: 1.8 mg/dL (ref 1.8–2.4)
Monocytes: 1.04 10*3/uL — ABNORMAL HIGH (ref 0.00–0.80)
Neutrophils: 5.15 10*3/uL (ref 1.80–7.00)
Nucleated RBC: 0 10*3/uL
Phosphate: 2.8 mg/dL (ref 2.5–4.5)
Platelet Count: 259 10*3/uL (ref 150–400)
Potassium: 4.1 meq/L (ref 3.6–5.2)
Protein (Total): 6.3 g/dL (ref 6.0–8.2)
RBC: 3.64 10*6/uL — ABNORMAL LOW (ref 4.40–5.60)
RDW-CV: 12.3 % (ref 11.6–14.4)
Sodium: 141 meq/L (ref 135–145)
Urea Nitrogen: 18 mg/dL (ref 8–21)
WBC: 8.22 10*3/uL (ref 4.3–10.0)

## 2016-01-08 LAB — PULMONARY FUNCTION TESTING
FEF25-75-%Pred-Pre: 61
FEF25-75-Pre: 1.97
FEV1-%Pred-Pre: 80
FEV1-Pre: 2.74
FEV1/FVC-Pre: 72
FVC-%Pred-Pre: 86
FVC-Pre: 3.79

## 2016-01-08 LAB — TACROLIMUS BY LCMSMS: Tacrolimus By LCMSMS: 7 ng/mL

## 2016-01-08 NOTE — Patient Instructions (Addendum)
-   Continue current medications, continue home spirometry, continue to exercise  - We will do an ultrasound of your legs to evaluate the recent swelling; depending on the results, we may put you on a low dose fluid pill  - You can go back home to [place]  - Follow up with Dr. Mickel Duhamel and your PCP in the next 1-2 weeks  - Annual dermatology and opthamology evaluations  - Your PCP can follow your routine medical issues, vitamin D, bone density, vaccinations, cancer screenings

## 2016-01-08 NOTE — Progress Notes (Signed)
Dakota Marsh PULMONARY POST-TRANSPLANT VISIT  DATE:  10/30/15    ID:  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 He presented shortly thereafter with a sternal dehiscence complicated by PTX/pneumomediastinum, requiring  OR 2/23 for sternal plating and re-wiring, after which discharged 2/26.    Last seen 12/04/14. Since that visit overall doing well. Only complaint is mild LE edema, R>L. Also with some LBP, back spasms, and associated leg weakness, but he feels like these symptoms mainly due to muscle deconditioning, and all are improving significantly, with symptoms currently quite mild. SOB has nearly resolved. Doing pulm rehab twice weekly and doing some walking and even jogging on his own. Otherwise, denies cough/sputum, f/c/ns, GERD, n/v/abd pain.    ROS  - Weight slowly increasing, appetite much improved.  - A complete ROS was otherwise negative or as per hpi     PMH:  SP- BOLT 09/05/15 for COPD (CMV+/+, EBV+/+)   - Explant M. 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  Acetaminophen 325 MG Oral Tab, Take 650 mg by mouth every 6 hours as needed for pain., Disp: , Rfl:   .  Aspirin 81 MG Oral Tab, Take 81 mg by mouth daily., Disp: , Rfl:   .  Calcium Carbonate 500 MG Oral Chew Tab, Chew and swallow 1,000 mg by mouth 2 times a day., Disp: , Rfl:   .  Cholecalciferol 1000 units Oral Tab, Take 1,000 Units by mouth daily., Disp: , Rfl:   .  Docusate Sodium 250 MG Oral Cap, Take 250 mg by mouth 2 times a day., Disp: , Rfl:   .  Magnesium Oxide 400 MG Oral  Tab, Take 400 mg by mouth 2 times a day., Disp: , Rfl:   .  Multiple Vitamins-Minerals (MULTIVITAMIN & MINERAL OR), Take 1 tablet by mouth daily., Disp: , Rfl:   .  Mycophenolate Mofetil 500 MG Oral Tab, Take 1,000 mg by mouth 2 times a day., Disp: , Rfl:   .  Pantoprazole Sodium 40 MG Oral Tab EC, Take 1 tablet (40 mg) by mouth daily., Disp: 30 tablet, Rfl: 11  .  Polyethylene Glycol 3350 Oral Powder, Take 17 g by mouth daily. Fill cap with powder to the 17 gram mark and dissolve in 4 to 8 ounces of water., Disp: , Rfl:   .  Pravastatin Sodium (PRAVACHOL) 20 MG Oral Tab, Take 20 mg by mouth every evening., Disp: , Rfl:   .  PredniSONE 10 MG Oral Tab, Take 1 tablet (10 mg) by mouth daily., Disp: 90 tablet, Rfl: 3  .  Sulfamethoxazole-Trimethoprim 400-80 MG Oral Tab, Take 1 tablet by mouth daily., Disp: , Rfl:   .  Tacrolimus 1 MG Oral Cap, Take 4 capsules (4 mg) by mouth every 12 hours., Disp: 240 capsule, Rfl: 3  .  Venlafaxine HCl 100 MG Oral Tab, Take 100 mg by mouth 2 times a day., Disp: , Rfl:     ALLERGIES:  Codeine - anxiety/dyspnea    SH  Staying now in [place]. [place] be returning home soon to [place] - outside of [place]:    01/08/16 0836   BP: 118/77   Pulse: 86   Temp: 98.4 F (36.9 C)   TempSrc: Temporal   SpO2: 99%   Weight: 163 lb (73.9 kg)   Height: 5\' 9"  (1.753 m)     Body mass index is 24.07 kg/m.  General: comfortable appearing, appears stated age, NAD  HEENT: PERRL, moist mucous membranes, no mucosal lesions  Neck: Without LAD, no thyromegaly  Lungs: Chest is normal to appearance with a well healed incision, symmetric expansion, tympanic to percussion. No wheezes, crackles, or rhonci.  Heart: RRR, Nl S1/S2, without murmurs, gallops, rubs  Abdomen: Nondistended, +BS, soft, nontender, no guarding/rebound, no hepatosplenomegaly  Extremities: WWP, no clubbing or cyanosis, 1+ R>L ankle edema  Skin: no rashes/ lesions  Neuro: Strength normal and symmetric  Psych: Normal affect,  conversant    DATA:    Labs   All were reviewed, most notably -   Cr 1.0, others stable    CXR was reviewed by me and revealed no acute disease or changes    Spirometry R FVC   %         FEV1      %        2575     %  01/08/2016 72 3.79 86% 2.74 80% 1.97 61%   12/04/2015 73 3.6 82% 2.62 77% 1.91 59%   11/13/2015 79 3.13 71% 2.48 72% 2.13 66%   10/30/2015 84 3.29 76% 2.76 81% 3.02 95%   10/16/2015 89 3.03 70% 2.71 80% 3.3 103%   09/23/2015 82 3.32 77% 2.73 81% 2.72 85%   09/20/2015 85 3.26 75% 2.78 82% 2.76 87%     Interpretation - near nromal, improved from April though mid-flows remain below his early baseline    IMPRESSION/PLAN:  65 year old man 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 Lung transplant - Overall doing well. Spirometry improved from his recent decline in early April, though we'll have to monitor his mid-flows.    - IS - FK (8-10), MMF 1000q12, pred 10   - PPX - Bactrim indefinitely   - OK to return to [place] - will need to get back in with PCP and pulm for routine visits   - Also encouraged him to begin either a formal rehab program at home or continue home exercise    2. LE edema - We did dopplers and they were negative   - We can start low dose lasix, 20 QOD and monitor edema and labs    3. M. Xenopi on explant - completed his course    4. 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 Follow up 6 weeks

## 2016-01-09 ENCOUNTER — Other Ambulatory Visit (HOSPITAL_BASED_OUTPATIENT_CLINIC_OR_DEPARTMENT_OTHER): Payer: Self-pay

## 2016-01-09 DIAGNOSIS — Z942 Lung transplant status: Secondary | ICD-10-CM

## 2016-01-09 DIAGNOSIS — D849 Immunodeficiency, unspecified: Secondary | ICD-10-CM

## 2016-01-09 MED ORDER — TACROLIMUS 1 MG OR CAPS
5.0000 mg | ORAL_CAPSULE | Freq: Two times a day (BID) | ORAL | 11 refills | Status: DC
Start: 2016-01-09 — End: 2016-05-01

## 2016-01-09 MED ORDER — FUROSEMIDE 20 MG OR TABS
20.0000 mg | ORAL_TABLET | ORAL | 2 refills | Status: DC
Start: 2016-01-09 — End: 2016-08-31

## 2016-01-15 ENCOUNTER — Other Ambulatory Visit (HOSPITAL_BASED_OUTPATIENT_CLINIC_OR_DEPARTMENT_OTHER): Payer: Self-pay | Admitting: Internal Medicine

## 2016-01-15 ENCOUNTER — Other Ambulatory Visit (HOSPITAL_COMMUNITY): Payer: Self-pay

## 2016-01-15 DIAGNOSIS — D849 Immunodeficiency, unspecified: Secondary | ICD-10-CM

## 2016-01-15 DIAGNOSIS — Z942 Lung transplant status: Secondary | ICD-10-CM

## 2016-01-15 LAB — BASIC METABOLIC PANEL, EXTERNAL
Calcium, External: 8.7
Carbon Dioxide (Total), External: 23
Chloride, External: 108
Creatinine, External: 0.83
Glucose, External: 99
Potassium, External: 4.2
Sodium, External: 143
Urea Nitrogen, External: 16

## 2016-01-15 LAB — CMV_PCR QUANT, EXTERNAL: CMV Quant Result, External: NOT DETECTED

## 2016-01-15 LAB — TACROLIMUS, EXTERNAL: Tacrolimus, External: 7.3

## 2016-01-15 MED ORDER — PREDNISONE 10 MG OR TABS
10.0000 mg | ORAL_TABLET | Freq: Every day | ORAL | 3 refills | Status: DC
Start: 2016-01-15 — End: 2016-10-28

## 2016-01-15 MED ORDER — MYCOPHENOLATE MOFETIL 500 MG OR TABS
1000.0000 mg | ORAL_TABLET | Freq: Two times a day (BID) | ORAL | 11 refills | Status: DC
Start: 2016-01-15 — End: 2016-10-28

## 2016-01-16 ENCOUNTER — Encounter (HOSPITAL_BASED_OUTPATIENT_CLINIC_OR_DEPARTMENT_OTHER): Payer: Self-pay

## 2016-01-17 ENCOUNTER — Encounter (HOSPITAL_BASED_OUTPATIENT_CLINIC_OR_DEPARTMENT_OTHER): Payer: Self-pay

## 2016-01-30 ENCOUNTER — Other Ambulatory Visit (HOSPITAL_COMMUNITY): Payer: Self-pay

## 2016-01-30 LAB — CMV_PCR QUANT, EXTERNAL: CMV Quant Result, External: NOT DETECTED

## 2016-02-13 ENCOUNTER — Other Ambulatory Visit (HOSPITAL_COMMUNITY): Payer: Self-pay

## 2016-02-13 LAB — BASIC METABOLIC PANEL, EXTERNAL
Calcium, External: 9
Carbon Dioxide (Total), External: 24
Chloride, External: 107
Creatinine, External: 0.95
Glucose, External: 92
Potassium, External: 4.5
Sodium, External: 142
Urea Nitrogen, External: 31

## 2016-02-13 LAB — CBC (HEMOGRAM), EXTERNAL
Hematocrit, External: 34.7
Platelet Count, External: 263
WBC, External: 6.84

## 2016-02-13 LAB — CBC, DIFF, EXTERNAL: Neutrophils, External: 3.61

## 2016-02-13 LAB — CMV_PCR QUANT, EXTERNAL: CMV Quant Result, External: NOT DETECTED

## 2016-02-13 LAB — TACROLIMUS, EXTERNAL: Tacrolimus, External: 8.2

## 2016-02-14 ENCOUNTER — Encounter (HOSPITAL_BASED_OUTPATIENT_CLINIC_OR_DEPARTMENT_OTHER): Payer: Self-pay

## 2016-02-20 ENCOUNTER — Other Ambulatory Visit (HOSPITAL_BASED_OUTPATIENT_CLINIC_OR_DEPARTMENT_OTHER): Payer: Self-pay | Admitting: Pulmonary Disease

## 2016-02-20 DIAGNOSIS — Z48298 Encounter for aftercare following other organ transplant: Secondary | ICD-10-CM

## 2016-02-20 DIAGNOSIS — Z942 Lung transplant status: Secondary | ICD-10-CM

## 2016-02-26 ENCOUNTER — Ambulatory Visit: Payer: Medicare Other | Attending: Pulmonary Disease | Admitting: Transplant

## 2016-02-26 ENCOUNTER — Ambulatory Visit (HOSPITAL_BASED_OUTPATIENT_CLINIC_OR_DEPARTMENT_OTHER): Payer: Medicare Other

## 2016-02-26 ENCOUNTER — Other Ambulatory Visit (HOSPITAL_BASED_OUTPATIENT_CLINIC_OR_DEPARTMENT_OTHER): Payer: Self-pay | Admitting: Pulmonary Disease

## 2016-02-26 ENCOUNTER — Encounter (HOSPITAL_BASED_OUTPATIENT_CLINIC_OR_DEPARTMENT_OTHER): Payer: Self-pay

## 2016-02-26 ENCOUNTER — Other Ambulatory Visit (HOSPITAL_COMMUNITY): Payer: Self-pay

## 2016-02-26 ENCOUNTER — Other Ambulatory Visit (HOSPITAL_BASED_OUTPATIENT_CLINIC_OR_DEPARTMENT_OTHER): Payer: Self-pay

## 2016-02-26 VITALS — BP 131/74 | HR 75 | Temp 98.6°F | Ht 69.0 in | Wt 160.0 lb

## 2016-02-26 DIAGNOSIS — Z4824 Encounter for aftercare following lung transplant: Secondary | ICD-10-CM | POA: Insufficient documentation

## 2016-02-26 DIAGNOSIS — D849 Immunodeficiency, unspecified: Secondary | ICD-10-CM

## 2016-02-26 DIAGNOSIS — Z23 Encounter for immunization: Secondary | ICD-10-CM | POA: Insufficient documentation

## 2016-02-26 DIAGNOSIS — Z48298 Encounter for aftercare following other organ transplant: Secondary | ICD-10-CM | POA: Insufficient documentation

## 2016-02-26 DIAGNOSIS — Z942 Lung transplant status: Secondary | ICD-10-CM

## 2016-02-26 DIAGNOSIS — J9811 Atelectasis: Secondary | ICD-10-CM | POA: Insufficient documentation

## 2016-02-26 DIAGNOSIS — D899 Disorder involving the immune mechanism, unspecified: Secondary | ICD-10-CM | POA: Insufficient documentation

## 2016-02-26 DIAGNOSIS — R601 Generalized edema: Secondary | ICD-10-CM | POA: Insufficient documentation

## 2016-02-26 DIAGNOSIS — R5381 Other malaise: Secondary | ICD-10-CM | POA: Insufficient documentation

## 2016-02-26 DIAGNOSIS — G8918 Other acute postprocedural pain: Secondary | ICD-10-CM

## 2016-02-26 DIAGNOSIS — G8912 Acute post-thoracotomy pain: Secondary | ICD-10-CM | POA: Insufficient documentation

## 2016-02-26 DIAGNOSIS — Z6823 Body mass index (BMI) 23.0-23.9, adult: Secondary | ICD-10-CM

## 2016-02-26 DIAGNOSIS — R0789 Other chest pain: Secondary | ICD-10-CM

## 2016-02-26 LAB — PULMONARY FUNCTION TESTING
FEF25-75-%Pred-Pre: 59
FEF25-75-Pre: 1.91
FEV1-%Pred-Pre: 84
FEV1-Pre: 2.86
FEV1/FVC-Pre: 70
FVC-%Pred-Pre: 93
FVC-Pre: 4.1

## 2016-02-26 LAB — TACROLIMUS BY LCMSMS: Tacrolimus By LCMSMS: 6.7 ng/mL

## 2016-02-26 MED ORDER — TACROLIMUS 5 MG OR CAPS
5.0000 mg | ORAL_CAPSULE | Freq: Two times a day (BID) | ORAL | 11 refills | Status: DC
Start: 2016-02-26 — End: 2016-05-01

## 2016-02-26 NOTE — Progress Notes (Signed)
Vaccine Screening Questions: [facility]        1. I feel sick today.   NO    1. There is no evidence that acute illness reduces vaccine efficacy or increases vaccine adverse events. However, with moderate or severe illness, all vaccines should be delayed until the illness has improved. Mild illness (e.g., upperrespiratory infection or diarrhea) are NOT contraindications to vaccination. Do not withhold vaccination if a person is taking antibiotic.    2. Are you allergic to eggs, baker's yeast, gelatin, streptomycin, neomycin, formaldehyde,merthiolate / thimerosal? (Please circle all that apply)  NO    2. Please see Vaccine Excipient Table below at end of form.**    3. Are you currently taking "blood thinning medications" such as Warfarin (Coumadin), Enoxaparin (Lovenox, Dalteparin(Fragmin), Fondiparinux(Arixtra), Aspirin, Clopidogrel(Plavix), Prasugrel(Effient), Dipyridamol(Persantine), Ticagrelor(Brilinta), Cilostazol(Pletal), Aggrenox, Rivaroxaban(Xarelto), Dabigatran(Xarelto)  NO    3.  Patients who are currently receiving these medications need to advise their providers so that we may take specific precautions when giving intramuscular vaccines. Administer using a 23-gauge (or smaller) needle and apply pressure to  the vaccine site for 2 minutes after administration. DO NOT RUB the vaccine site. Patients should be counseled on monitoring for signs and symptoms of hematoma formation at the site of the injection.    4. Do you have allergies to medications, food, or any vaccine?   If yes, please list:'@ALLERGIES'$ @  YES:  Aspirin 81 mg    4. History of anaphylactic reaction such as hives, wheezing or difficulty breathing, or circulatory collapse or shock (not fainting) from a previous dose of vaccine or vaccine component is a contraindication for further doses. For example, if a person  experiences anaphylaxis after eating eggs, do not administer influenza vaccine, or if a person has  anaphylaxis after eating gelatin, do not administer MMR or varicella vaccine. Local reactions (e.g., a red eye following instillation of ophthalmic solution) are not contraindications. Please see Vaccine Excipient Table enclosed on order form.     5. Have you ever had a severe reaction to any vaccine? If yes, please describe:  NO    5.  History of anaphylactic reaction to a previous dose of vaccine or vaccine component is a contraindication for subsequent  doses. Under normal circumstances, vaccines are deferred when a precaution is present. However, situations may arise  when the benefit outweighs the risk (e.g., community measles outbreak).    6. Are you currently undergoing radiation treatments?  NO    6.  If patient is undergoing radiation therapy, inactivated vaccine is no problem. For live virus vaccines contact patient's PCP prior to administration.    7. Have you had Immune Globulin (Gamma Globulin) or received blood or blood products in the past seven months?  NO    7. Live virus vaccines may need to be deferred, depending on several variables. Please see the ACIP Statement "General Recommendations on Immunization" on page 1 of the [place] and APPENDIX A for suggested intervals between administration of blood products and live virus vaccines.    8. Have you ever had Guillian-Barre Syndrome, a condition that causes paralysis?  NO    8. If the patient has had this condition, hold the vaccination and call the MD.    9.  Are you pregnant or planning a pregnancy in the next three months?  NO    9. Live virus vaccines are contraindicated prior to and during pregnancy due to the possible risk of virus transmission to the fetus. Instruct patients  who are sexually active in their childbearing years to practice careful contraception for one month  after receiving the MMR, rubella, varicella, or intranasal influenza vaccination.    10. Have you received any vaccinations in the past 4 weeks?  NO    10. You may either  administer two live vaccines on the same day OR separate them by 28 days. Live vaccines must replicate in order for the immune system to mount a response. Separating live vaccine administrations allows for less interference of  viral replication. On the other hand, inactivated vaccines may be given together or at any spacing interval.    11. Are you allergic to latex products? (If yes, please describe reaction):  NO    11. If the patient has a true latex allergy, call the inpatient pharmacy ([phone]) and they will provide a latex-free immunization.          Adapted from "Understanding the Screening Questionnaire for Adult Immunization", Immunization [employer] Coalition, www.immunize.org.   Updated June 2014    **VACCINE EXCIPIENT AND MEDIA SUMMARY  Excipient Use Vaccine    Egg protein: Influenza (all brands)  Formaldehyde, formalin:  DTaP (all brands), Td (all brands), Hepatitis A (Havrix, Vaqta), Hepatitis A-hepatitis B (Twinrix), Influenza (Fluogen, FluShield, Fluzone)  Gelatin: Influenza (Fluzone), Measles (Attenuvax), Mumps (Mumpsvax),  Rubella (Meruvax II), MMR (MMRII), Typhoid oral (Vivotif), Varicella (Varivax)  Neomycin:  Measles (Attenuvax), Mumps (Mumpsvax), Rubella (Meruvax II),  MMR (MMR-II), Rabies (Imovax), Influenza (Fluvirin)  Phenol: Pneumococcal polysaccharide (Pneumovax 23)  Streptomycin: Influenza (Fluogen)  Thimerosal: Influenza (all brands except the intranasal vaccine), Pneumococcal  polysaccharide (Pneu-Immune 23), Meningococcal (Menomune)  Yeast: Hepatitis B (Engerix-B, Recombivax-HB)    Adapted from "Vaccine Excipient & Media Summary", ImmunoFacts, Facts and Comparisons,  February 2001 and Offit PA, Jew RK. Addressing parents' concerns: do vaccines contain harmful  preservatives, adjuvants, additives, or residuals? Pediatrics.2003;112(6);1394-7.  Updated June 2014

## 2016-02-26 NOTE — Progress Notes (Addendum)
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 Dakota Marsh presented shortly thereafter with a sternal dehiscence complicated by PTX/pneumomediastinum, requiring  OR 2/23 for sternal plating and re-wiring, after which discharged 2/26.    Last seen 10/30/15. Only complaint was mild LE edema, R>L, dopplers performed and were negative, started on low dose lasix. Since that visit Dakota Marsh has moved back to [place] and has been doing well. Edema still present but very mild, improved. Still with some LBP and sciatica symptoms. Leg weakness, however, has improved significantly, and Dakota Marsh is walking at least 20-30 mins each day. Dakota Marsh complains of some sternal pain, no worse but also not improved - Dakota Marsh wonders about whether this is related to the sternal plate as Dakota Marsh feels restricted, as if a band is creating sensation of tightness across sternum and chest.  Denies SOB, cough/sputum, f/c/ns, GERD, n/v/abd pain, diarrhea.    ROS  - Feels mildly depressed at times but this is improving; wonders if going back to work would help. Interested in working at the [employer].  - A complete ROS was otherwise negative or as per hpi     PMH:  SP- BOLT 09/05/15 for COPD (CMV+/+, EBV+/+)   - Explant M. 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  Acetaminophen 325 MG Oral Tab, Take 650 mg by mouth every 6 hours as needed for pain., Disp: , Rfl:   .  Aspirin 81 MG Oral Tab, Take 81 mg by mouth daily., Disp: , Rfl:      .  Calcium Carbonate 500 MG Oral Chew Tab, Chew and swallow 1,000 mg by mouth 2 times a day., Disp: , Rfl:   .  Cholecalciferol 1000 units Oral Tab, Take 1,000 Units by mouth daily., Disp: , Rfl:   .  Docusate Sodium 250 MG Oral Cap, Take 250 mg by mouth 2 times a day., Disp: , Rfl:   .  Furosemide 20 MG Oral Tab, Take 1 tablet (20 mg) by mouth every 48 hours., Disp: 15 tablet, Rfl: 2  .  Magnesium Oxide 400 MG Oral Tab, Take 400 mg by mouth 2 times a day., Disp: , Rfl:   .  Multiple Vitamins-Minerals (MULTIVITAMIN & MINERAL OR), Take 1 tablet by mouth daily., Disp: , Rfl:   .  Mycophenolate Mofetil 500 MG Oral Tab, Take 2 tablets (1,000 mg) by mouth 2 times a day., Disp: 120 tablet, Rfl: 11  .  Pantoprazole Sodium 40 MG Oral Tab EC, Take 1 tablet (40 mg) by mouth daily., Disp: 30 tablet, Rfl: 11  .  Polyethylene Glycol 3350 Oral Powder, Take 17 g by mouth daily. Fill cap with powder to the 17 gram mark and dissolve in 4 to 8 ounces of water., Disp: , Rfl:   .  Pravastatin Sodium (PRAVACHOL) 20 MG Oral Tab, Take 20 mg by mouth every evening., Disp: , Rfl:   .  PredniSONE 10 MG Oral  Tab, Take 1 tablet (10 mg) by mouth daily., Disp: 90 tablet, Rfl: 3  .  Sulfamethoxazole-Trimethoprim 400-80 MG Oral Tab, Take 1 tablet by mouth daily., Disp: , Rfl:   .  Tacrolimus 1 MG Oral Cap, Take 5 capsules (5 mg) by mouth every 12 hours., Disp: 300 capsule, Rfl: 11  .  Venlafaxine HCl 100 MG Oral Tab, Take 100 mg by mouth daily. , Disp: , Rfl:     ALLERGIES:  Codeine - anxiety/dyspnea    SH  Now back in [facility], outside of [place]. Living by himself. Interested in returning to work at [employer].    PHYSICAL EXAM  Vitals:    02/26/16 0934   BP: 131/74   Pulse: 75   Temp: 98.6 F (37 C)   TempSrc: Temporal   SpO2: 100%   Weight: 160 lb (72.6 kg)   Height: 5\' 9"  (1.753 m)   Body mass index is 23.63 kg/m.  General: comfortable appearing, appears stated age, NAD  HEENT: PERRL, moist mucous membranes, no mucosal  lesions  Neck: Without LAD, no thyromegaly  Lungs: Chest is normal to appearance with a well healed incision, no palpable sternal abnormality, symmetric expansion, tympanic to percussion. No wheezes, crackles, or rhonci.  Heart: RRR, Nl S1/S2, without murmurs, gallops, rubs  Abdomen: Nondistended, +BS, soft, nontender, no guarding/rebound, no hepatosplenomegaly  Extremities: WWP, no clubbing or cyanosis, trace R>L ankle edema (improved)  Skin: no rashes/ lesions  Neuro: Strength normal and symmetric  Psych: Normal affect, conversant    DATA:    Labs   All were reviewed, most notably -   Cr 1.1, others stable    CXR was reviewed by me and revealed no acute disease or changes    Spirometry R FVC   %         FEV1      %        2575     %  02/26/16 70 4.10 93 2.86 84 1.91 59   01/08/2016 72 3.79 86% 2.74 80% 1.97 61%   12/04/2015 73 3.6 82% 2.62 77% 1.91 59%   11/13/2015 79 3.13 71% 2.48 72% 2.13 66%   10/30/2015 84 3.29 76% 2.76 81% 3.02 95%   10/16/2015 89 3.03 70% 2.71 80% 3.3 103%   09/23/2015 82 3.32 77% 2.73 81% 2.72 85%   09/20/2015 85 3.26 75% 2.78 82% 2.76 87%     Interpretation - near nromal, best yet with the exception of mid-flows, which remain unchanged/below his early baseline    IMPRESSION/PLAN:  65 year old man 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 Lung transplant - Overall doing well clinically, with best-yet spirometry today - though will continue to monitor his mid-flows.    - IS - FK (8-10), MMF 1000q12, pred 10   - PPX - Bactrim indefinitely   - Increase exercise   - Dakota Marsh wonders about whether his sternal plate is responsible for the chest pain. Possible, but more likely normal residual post-op pain. Since it's fairly mild and improving will simply monitor for now   - Dakota Marsh's now 6 months out and will administer last of Twinrix series. Hep B serologies in ~1 month.    2. LE edema - Dopplers were negative, and will continue low dose  lasix (20 QOD), while monitoring exam/labs    3. M. Xenopi on explant - completed his course  4. 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 Follow up 2 months

## 2016-02-27 LAB — LUNG TRX, POST OP EXTENDED PANEL
% Basophils: 0 %
% Eosinophils: 1 %
% Immature Granulocytes: 6 %
% Lymphocytes: 20 %
% Monocytes: 10 %
% Neutrophils: 63 %
% Nucleated RBC: 0 %
ALT (GPT): 10 U/L (ref 10–48)
AST (GOT): 14 U/L (ref 9–38)
Absolute Eosinophil Count: 0.04 10*3/uL (ref 0.00–0.50)
Absolute Lymphocyte Count: 1.35 10*3/uL (ref 1.00–4.80)
Albumin: 3.8 g/dL (ref 3.5–5.2)
Alkaline Phosphatase (Total): 65 U/L (ref 36–161)
Anion Gap: 6 (ref 4–12)
Basophils: 0.02 10*3/uL (ref 0.00–0.20)
Bilirubin (Total): 0.3 mg/dL (ref 0.2–1.3)
CMV DNA IU/mL (Log10): 1.2
CMV Quant Result: 15 [IU]/mL — AB
Calcium: 8.9 mg/dL (ref 8.9–10.2)
Carbon Dioxide, Total: 28 meq/L (ref 22–32)
Chloride: 110 meq/L — ABNORMAL HIGH (ref 98–108)
Creatinine: 1.06 mg/dL (ref 0.51–1.18)
GFR, Calc, African American: >60 mL/min/{1.73_m2} (ref >59)
GFR, Calc, European American: >60 mL/min/{1.73_m2} (ref >59)
Glucose: 103 mg/dL (ref 62–125)
Hematocrit: 35 % — ABNORMAL LOW (ref 38–50)
Hemoglobin: 10.7 g/dL — ABNORMAL LOW (ref 13.0–18.0)
Immature Granulocytes: 0.42 10*3/uL — ABNORMAL HIGH (ref 0.00–0.05)
MCH: 28.6 pg (ref 27.3–33.6)
MCHC: 30.8 g/dL — ABNORMAL LOW (ref 32.2–36.5)
MCV: 93 fL (ref 81–98)
Magnesium: 1.7 mg/dL — ABNORMAL LOW (ref 1.8–2.4)
Monocytes: 0.7 10*3/uL (ref 0.00–0.80)
Neutrophils: 4.22 10*3/uL (ref 1.80–7.00)
Nucleated RBC: 0 10*3/uL
Phosphate: 3 mg/dL (ref 2.5–4.5)
Platelet Count: 247 10*3/uL (ref 150–400)
Potassium: 4.1 meq/L (ref 3.6–5.2)
Protein (Total): 6.3 g/dL (ref 6.0–8.2)
RBC: 3.74 10*6/uL — ABNORMAL LOW (ref 4.40–5.60)
RDW-CV: 13.2 % (ref 11.6–14.4)
Sodium: 144 meq/L (ref 135–145)
Urea Nitrogen: 23 mg/dL — ABNORMAL HIGH (ref 8–21)
WBC: 6.75 10*3/uL (ref 4.3–10.0)

## 2016-02-28 ENCOUNTER — Other Ambulatory Visit (HOSPITAL_BASED_OUTPATIENT_CLINIC_OR_DEPARTMENT_OTHER): Payer: Self-pay

## 2016-02-28 ENCOUNTER — Ambulatory Visit: Payer: Medicare Other | Attending: Cardiovascular Disease | Admitting: Cardiovascular Disease

## 2016-02-28 ENCOUNTER — Encounter (HOSPITAL_BASED_OUTPATIENT_CLINIC_OR_DEPARTMENT_OTHER): Payer: Self-pay

## 2016-02-28 VITALS — BP 120/64 | HR 80 | Ht 69.0 in | Wt 168.0 lb

## 2016-02-28 DIAGNOSIS — I2510 Atherosclerotic heart disease of native coronary artery without angina pectoris: Secondary | ICD-10-CM | POA: Insufficient documentation

## 2016-02-28 DIAGNOSIS — G8918 Other acute postprocedural pain: Secondary | ICD-10-CM

## 2016-02-28 DIAGNOSIS — I10 Essential (primary) hypertension: Secondary | ICD-10-CM | POA: Insufficient documentation

## 2016-02-28 DIAGNOSIS — Z942 Lung transplant status: Secondary | ICD-10-CM | POA: Insufficient documentation

## 2016-02-28 DIAGNOSIS — R0789 Other chest pain: Secondary | ICD-10-CM

## 2016-02-28 DIAGNOSIS — Z6824 Body mass index (BMI) 24.0-24.9, adult: Secondary | ICD-10-CM

## 2016-02-28 DIAGNOSIS — G8912 Acute post-thoracotomy pain: Secondary | ICD-10-CM | POA: Insufficient documentation

## 2016-02-28 NOTE — Patient Instructions (Addendum)
Dakota Marsh,    You are doing well,      I shall communicate with Dr. Carolann Littler about changing your statin therapy to a more effective drug, atorvastatin.  I shall be suggesting that you start with 20 mg and increase the dose to somewhere between 40 and 80 mg at night if you tolerate it without side effects.      I would be happy to see you in a year.   I understand that it may be better for you to be followed in [place].  That is fine.      Your nurse in Cardiology Clinic is Vivianne Spence, RN [facility]    Dakota Marsh,  My phone [phone]  My email [email or web address]

## 2016-02-28 NOTE — Progress Notes (Signed)
[place] NOTE    Chief Complaint  Chief Complaint   Patient presents with   . Coronary Artery Disease        Interval History  Dakota Marsh is a 65 year old year old man who returns for follow up.      PMD Dr. 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  In his words he sailed through the procedure but he had to go back to the OR for a wound breakdown and received a plate to re-enforce his sternum .  Amazingly he was on the transplant list for one day only.  He has been home in [place] for the last 6 weeks.  He left [name] 09 Jan 2016.  He had an air leak that prolonged his stay in [place]. Marland Kitchen    He is living near [place] in [facility] at [place]800 feet.  He is aware of no physical lilmitations.- no angina pain, no dyspnea on exertion, no edema.  He is limited by lower back discomfort which he attributes to years of carrying an oxygen machine and bending over to get his breath.  He recently rototilled his garden that is 50 feet by 60 feet.  IHe accomplished that task in one session with the roto- tiller.   "This has been quite a trip".  He has gained 25 pounds since his transplant.   Social History     Social History Narrative   . No narrative on file     Social History   Substance Use Topics   . Smoking status: Former Smoker     Packs/day: 1.50     Types: Cigarettes     Quit date: 11/13/2003   . Smokeless tobacco: Former Systems developer   . Alcohol use No     Family History   Problem Relation Age of Onset   . No Family Hx No Hx Of      Patient Active Problem List   Diagnosis   . Hypertension   . GERD (gastroesophageal reflux disease)   . Depression   . Lung transplanted ([place])   . CAD (coronary  artery disease)   . 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 Immunosuppression ([place])   . Physical deconditioning   . Generalized edema   . Chest wall pain following surgery     Outpatient Medications Prior to Visit   Medication Sig Dispense Refill   . Acetaminophen 325 MG Oral Tab Take 650 mg by mouth every 6 hours as needed for pain.     . Aspirin 81 MG Oral Tab Take 81 mg by mouth daily.     . Calcium Carbonate 500 MG Oral Chew Tab Chew and swallow 1,000 mg by mouth 2 times a day.     . Cholecalciferol 1000 units Oral Tab Take 1,000 Units by mouth daily.     Mariane Baumgarten Sodium 250 MG Oral Cap Take 250 mg by mouth 2 times a day.     . Furosemide 20 MG Oral Tab Take 1 tablet (20 mg) by mouth every 48 hours. 15 tablet 2   . Magnesium Oxide 400 MG Oral Tab Take 400 mg by mouth 2 times a day.     Marland Kitchen  Multiple Vitamins-Minerals (MULTIVITAMIN & MINERAL OR) Take 1 tablet by mouth daily.     . Mycophenolate Mofetil 500 MG Oral Tab Take 2 tablets (1,000 mg) by mouth 2 times a day. 120 tablet 11   . Pantoprazole Sodium 40 MG Oral Tab EC Take 1 tablet (40 mg) by mouth daily. 30 tablet 11   . Polyethylene Glycol 3350 Oral Powder Take 17 g by mouth daily. Fill cap with powder to the 17 gram mark and dissolve in 4 to 8 ounces of water.     . Pravastatin Sodium (PRAVACHOL) 20 MG Oral Tab Take 20 mg by mouth every evening.     . PredniSONE 10 MG Oral Tab Take 1 tablet (10 mg) by mouth daily. 90 tablet 3   . Sulfamethoxazole-Trimethoprim 400-80 MG Oral Tab Take 1 tablet by mouth daily.     . Tacrolimus 1 MG Oral Cap Take 5 capsules (5 mg) by mouth every 12 hours. 300 capsule 11   . Tacrolimus 5 MG Oral Cap Take 1 capsule (5 mg) by mouth every 12 hours. 60 capsule 11   . Venlafaxine HCl 100 MG Oral Tab Take 100 mg by mouth daily.        No facility-administered medications prior to visit.      Review of patient's allergies indicates:  Allergies   Allergen Reactions   . Codeine      ANXIETY/DYSPNEA       Review of  Systems  Constitutional: Positive for feeling wonderful post transplant. .   Eyes: Positive for blurry vision, more so than in the past.  This blurriness extends until noon. .   Ears, Nose, Mouth, Throat: Negative   Cardiovascular: As noted in HPI above  Respiratory: As noted in HPI above  Gastrointestinal: Positive for constipation.  Genitourinary: Positive for nocturia X's one.   Musculoskeletal: Positive for low back pain.  Has seen no orthopedist or rheumatologist. .  Skin: Positive for easy bruising.  Neurological: Negative   Psychiatric: Positive for longstanding yse of antidepressants.Marland Kitchen  He plans to taper off of venlafaxine at his year anniversary of lung transplant. .   Endocrine: Negative   Hematologic/Lymphatic: Negative  Allergic/Immunologic: Positive for codeine that results in hyperventilation.   .  A complete review of systems through the [facility] Sheet was otherwise negative except as noted above.     Physical Exam  Constitutional: healthy, no distress and cooperative. BP 120/64   Pulse 80   Ht 5\' 9"  (1.753 m)   Wt 168 lb (76.2 kg)   SpO2 100%   BMI 24.81 kg/m  .  Eyes:  Normal sclera. Pupils are equal and reactive.  Head/Neck: No adenopathy in the neck. Flexion and extension of the neck to the left and right is not limited. Rotation of the neck is not limited. Thyroid is not enlarged or noduled. No evidence of aortic pulsation at suprasternal notch. Full carotid pulse. No bruit. Jugular venous pressure normal.   Mouth: No significant findings of the teeth and buccal mucosa. Mallampati score: unknown.   Respiratory/Chest: No Rales/Wheezes/Rhonchi. Diaphragm moves well and symmetrically. Lungs expand well and symmetrically with inspiration. Lungs are clear.   Cardiovascular:rate: normal , rhythm: regular , PMI normal, no lift, heave, or thrill, regular rate and rhythm, S1 normal, S2 normal, no murmurs, clicks, or gallops, heart tones are normal.   Pulses:   Posterior tibial 2+ and dorsalis pedae pulses are 2+ bilaterally. Radial pulses are 2+ bilaterally. Ulnar pulses  are 1+ bilaterally.   Gastrointestinal: Abdomen supple without tenderness, masses or hepatosplenomegaly. Spleen is not palpable. Liver is not enlarged. Normal bowel sounds. Aorta is not prominent.   Musculoskeletal: Normal gait/stance. No evident joint deformities in the hands. No evident deformities in the knees. No kyphoscoliosis.   Extremities: No edema. No calf tenderness.   Skin: Warm and dry.   Neurological: Grossly intact.  Psychiatric: He is oriented to person, place, time and situation.      Laboratory Results  Cholesterol (HDL)   Date Value Ref Range Status   04/04/2015 83 >39 mg/dL Final     Cholesterol (LDL)   Date Value Ref Range Status   04/04/2015 132 (H) <130 mg/dL Final     Cholesterol (Total)   Date Value Ref Range Status   09/04/2015 180 <200 mg/dL Final     Comment:     Reference range comments for cholesterol. Based on the NCEP guidelines (see   [email or web address]       Total   cholesterol interpretation: Desirable (<200 mg/dL) Borderline high (200-239)   High (>239)     04/04/2015 231 (H) <200 mg/dL Final     Cholesterol/HDL Ratio   Date Value Ref Range Status   04/04/2015 2.8  Final     Triglyceride   Date Value Ref Range Status   09/04/2015 93 <150 mg/dL Final     Comment:     Reference range comments for triglycerides. Based on the NCEP guidelines (see   [email or web address]         Triglyceride interpretation: Normal (<150 mg/dL) Borderline-high (150-199)   High (200-499) Very high (>499)     04/04/2015 80 <150 mg/dL Final     No results found for: NA  No results found for: K  Chloride   Date Value Ref Range Status   02/26/2016 110 (H) 98 - 108 meq/L Final     Carbon Dioxide, Total   Date Value Ref Range Status   02/26/2016 28 22 - 32 meq/L Final     Anion Gap   Date Value Ref Range Status    02/26/2016 6 4 - 12 Final     Glucose   Date Value Ref Range Status   02/26/2016 103 62 - 125 mg/dL Final     Urea Nitrogen   Date Value Ref Range Status   02/26/2016 23 (H) 8 - 21 mg/dL Final      Calcium   Date Value Ref Range Status   02/26/2016 8.9 8.9 - 10.2 mg/dL Final     Hemoglobin A1C (%)   Date Value   09/06/2015 5.5     Thyroid Stimulating Hormone (u[IU]/mL)   Date Value   04/04/2015 1.086      No results found for this or any previous visit.   Lab Results   Component Value Date    CREATININE 1.06 02/26/2016       Assessment/Plan  (I10) Essential hypertension  (primary encounter diagnosis)  I Excellent control of blood pressure on furosemide 20 mg every other day.   (I25.10) Coronary artery disease involving native coronary artery of native heart without angina pectoris  I 40% distal RCA lesion with no ischemia and no symptoms. On pravastatin 20 mg daily.   (See Problem List)  (G89.12) Chest wall pain following surgery  I Resolved with reoperation and insertion of a sternal plate  II Further therapy Not-needed.   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  Required reoperation to stabilize sternum and treatment of an air leak.  II Plan regular transplant follow-up  Baseline SPECT Nuclear Stress Test with treadmill in next year.    RTC PRN

## 2016-03-04 DIAGNOSIS — E785 Hyperlipidemia, unspecified: Secondary | ICD-10-CM | POA: Insufficient documentation

## 2016-03-04 DIAGNOSIS — T466X5A Adverse effect of antihyperlipidemic and antiarteriosclerotic drugs, initial encounter: Secondary | ICD-10-CM | POA: Insufficient documentation

## 2016-03-05 ENCOUNTER — Encounter (HOSPITAL_BASED_OUTPATIENT_CLINIC_OR_DEPARTMENT_OTHER): Payer: Self-pay

## 2016-03-06 ENCOUNTER — Encounter (HOSPITAL_BASED_OUTPATIENT_CLINIC_OR_DEPARTMENT_OTHER): Payer: Self-pay

## 2016-03-06 ENCOUNTER — Other Ambulatory Visit (HOSPITAL_COMMUNITY): Payer: Self-pay

## 2016-03-06 LAB — CMV_PCR QUANT, EXTERNAL: CMV Quant Result, External: NOT DETECTED

## 2016-03-06 LAB — BASIC METABOLIC PANEL, EXTERNAL
Calcium, External: 9
Carbon Dioxide (Total), External: 26
Chloride, External: 107
Creatinine, External: 0.83
Glucose, External: 93
Potassium, External: 4.1
Sodium, External: 143
Urea Nitrogen, External: 22

## 2016-03-06 LAB — TACROLIMUS, EXTERNAL: Tacrolimus, External: 10.5

## 2016-04-06 ENCOUNTER — Other Ambulatory Visit (HOSPITAL_COMMUNITY): Payer: Self-pay

## 2016-04-06 LAB — CMV_PCR QUANT, EXTERNAL: CMV Quant Result, External: 608

## 2016-04-06 LAB — CBC (HEMOGRAM), EXTERNAL
Hematocrit, External: 34.3
Hemoglobin, External: 10.6
Platelet Count, External: 272
WBC, External: 5.1

## 2016-04-06 LAB — BASIC METABOLIC PANEL, EXTERNAL
Calcium, External: 8.8
Carbon Dioxide (Total), External: 26
Chloride, External: 105
Creatinine, External: 0.83
Glucose, External: 95
Potassium, External: 3.7
Sodium, External: 140
Urea Nitrogen, External: 21

## 2016-04-06 LAB — CBC, DIFF, EXTERNAL: Neutrophils, External: 2.53

## 2016-04-06 LAB — TACROLIMUS, EXTERNAL: Tacrolimus, External: 4.7

## 2016-04-07 ENCOUNTER — Encounter (HOSPITAL_BASED_OUTPATIENT_CLINIC_OR_DEPARTMENT_OTHER): Payer: Self-pay

## 2016-04-08 ENCOUNTER — Encounter (HOSPITAL_BASED_OUTPATIENT_CLINIC_OR_DEPARTMENT_OTHER): Payer: Self-pay

## 2016-04-09 ENCOUNTER — Encounter (HOSPITAL_COMMUNITY): Payer: Self-pay

## 2016-04-09 ENCOUNTER — Other Ambulatory Visit (HOSPITAL_COMMUNITY): Payer: Self-pay

## 2016-04-09 ENCOUNTER — Encounter (HOSPITAL_BASED_OUTPATIENT_CLINIC_OR_DEPARTMENT_OTHER): Payer: Self-pay

## 2016-04-09 ENCOUNTER — Other Ambulatory Visit (HOSPITAL_BASED_OUTPATIENT_CLINIC_OR_DEPARTMENT_OTHER): Payer: Self-pay | Admitting: Internal Medicine

## 2016-04-09 DIAGNOSIS — Z942 Lung transplant status: Secondary | ICD-10-CM

## 2016-04-09 MED ORDER — VALGANCICLOVIR HCL 450 MG OR TABS
900.0000 mg | ORAL_TABLET | Freq: Two times a day (BID) | ORAL | 11 refills | Status: DC
Start: 2016-04-09 — End: 2016-05-13

## 2016-04-09 NOTE — Progress Notes (Signed)
Tacrolimus dose change per [facility] transplant clinic note

## 2016-04-17 ENCOUNTER — Other Ambulatory Visit (HOSPITAL_COMMUNITY): Payer: Self-pay

## 2016-04-17 LAB — BASIC METABOLIC PANEL, EXTERNAL
Calcium, External: 9
Carbon Dioxide (Total), External: 26
Chloride, External: 108
Creatinine, External: 0.91
Glucose, External: 102
Potassium, External: 4.4
Sodium, External: 141
Urea Nitrogen, External: 18

## 2016-04-17 LAB — CBC (HEMOGRAM), EXTERNAL
Hematocrit, External: 35.7
Platelet Count, External: 264
WBC, External: 2.53

## 2016-04-17 LAB — TACROLIMUS, EXTERNAL: Tacrolimus, External: 9.7

## 2016-04-17 LAB — CBC, DIFF, EXTERNAL: Neutrophils, External: 0.91

## 2016-04-17 LAB — CMV_PCR QUANT, EXTERNAL: CMV Quant Result, External: NOT DETECTED

## 2016-04-20 ENCOUNTER — Encounter (HOSPITAL_BASED_OUTPATIENT_CLINIC_OR_DEPARTMENT_OTHER): Payer: Self-pay

## 2016-04-20 ENCOUNTER — Other Ambulatory Visit (HOSPITAL_COMMUNITY): Payer: Self-pay

## 2016-04-23 ENCOUNTER — Other Ambulatory Visit (HOSPITAL_BASED_OUTPATIENT_CLINIC_OR_DEPARTMENT_OTHER): Payer: Self-pay | Admitting: Internal Medicine

## 2016-04-23 DIAGNOSIS — Z942 Lung transplant status: Secondary | ICD-10-CM

## 2016-04-29 ENCOUNTER — Encounter (HOSPITAL_BASED_OUTPATIENT_CLINIC_OR_DEPARTMENT_OTHER): Payer: Medicare Other

## 2016-05-01 ENCOUNTER — Other Ambulatory Visit (HOSPITAL_BASED_OUTPATIENT_CLINIC_OR_DEPARTMENT_OTHER): Payer: Self-pay

## 2016-05-01 ENCOUNTER — Encounter (HOSPITAL_BASED_OUTPATIENT_CLINIC_OR_DEPARTMENT_OTHER): Payer: Self-pay

## 2016-05-01 ENCOUNTER — Other Ambulatory Visit (HOSPITAL_COMMUNITY): Payer: Self-pay

## 2016-05-01 DIAGNOSIS — Z942 Lung transplant status: Secondary | ICD-10-CM

## 2016-05-01 DIAGNOSIS — D849 Immunodeficiency, unspecified: Secondary | ICD-10-CM

## 2016-05-01 LAB — CBC (HEMOGRAM), EXTERNAL
Hematocrit, External: 38
Platelet Count, External: 312
WBC, External: 2.03

## 2016-05-01 LAB — CMV_PCR QUANT, EXTERNAL: CMV Quant Result, External: NOT DETECTED

## 2016-05-01 LAB — CBC, DIFF, EXTERNAL: Neutrophils, External: 0.07

## 2016-05-01 MED ORDER — TACROLIMUS 5 MG OR CAPS
5.0000 mg | ORAL_CAPSULE | Freq: Two times a day (BID) | ORAL | 11 refills | Status: DC
Start: 2016-05-01 — End: 2016-06-30

## 2016-05-01 MED ORDER — TACROLIMUS 1 MG OR CAPS
1.0000 mg | ORAL_CAPSULE | Freq: Two times a day (BID) | ORAL | 11 refills | Status: DC
Start: 2016-05-01 — End: 2016-06-03

## 2016-05-05 ENCOUNTER — Other Ambulatory Visit (HOSPITAL_COMMUNITY): Payer: Self-pay

## 2016-05-05 LAB — CBC, DIFF, EXTERNAL: Neutrophils, External: 1.17

## 2016-05-05 LAB — CMV_PCR QUANT, EXTERNAL: CMV Quant Result, External: NOT DETECTED

## 2016-05-05 LAB — CBC (HEMOGRAM), EXTERNAL
Hematocrit, External: 35.7
Platelet Count, External: 191
WBC, External: 3.95

## 2016-05-06 ENCOUNTER — Encounter (HOSPITAL_BASED_OUTPATIENT_CLINIC_OR_DEPARTMENT_OTHER): Payer: Self-pay

## 2016-05-07 ENCOUNTER — Encounter (HOSPITAL_BASED_OUTPATIENT_CLINIC_OR_DEPARTMENT_OTHER): Payer: Self-pay

## 2016-05-12 ENCOUNTER — Encounter (HOSPITAL_BASED_OUTPATIENT_CLINIC_OR_DEPARTMENT_OTHER): Payer: Self-pay | Admitting: Internal Medicine

## 2016-05-12 NOTE — Progress Notes (Signed)
Date: 05/13/2016  Referred by: Dr.Altemeier  Time spent with patient: 15 minutes  Reason for appointment: Post Lung Transplant Nutrition Assessment and Education-Reevaluation  Interpreter used:  No    PI:  Dakota Marsh is a 65 year old male with a history of COPD, emphysema-type  presents s/p BOLT on 09/05/2015     Anthropometric Data:  Current wt: 73 kg 05/13/2016     76.2 kg 02/28/2016    66.1 kg 09/30/2015    Admit wt: 67.9 kg 09/04/2015    Height: 174.3 cm-measured by RD 04/04/2015  Body mass index is 24.03 kg/m.      Weight history: 63.8 kg 06/25/2015    63.2 kg 04/04/2015    Usual body weight: 66.8 kg      Ideal body weight: 66-72 kg    Weight for calculations: 72 kg      Patient Interview/Subjective:    Recent unintended 7 pound weight loss associated with shingles  Had been weighing 168 lbs and was 161 today  Significant pain associated with shingles and shot last month limiting PO  Generally eating at a local cafe where he eats twice a day.  Lives alone  Typical days includes eggs, breakfast meat, toast hash browns and fruit for breakfast and has the salad bar and an entree for dinner.  Adds salt to restaurant meals  Drinks an oral nutritional supplement which has ~ 30 gm protein/serving  S[place] his house in [facility], [place] and is now living in a trailer on a friend's property while he looks for another  Reports ongoing pain, poor sleep and fatigue  Quit his job in the [employer]  No longer taking Lasix and doesn't notice fluid retention  Taking Joline Salt Brand Adult MVI and Vitamin D3 (2000-3000 IU/day)     Clinical Data:  Diagnosis and medical history:  SP- BOLT 09/05/15 for COPD (CMV+/+, EBV+/+)                  - Explant M. 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 Acetaminophen 325 MG Oral Tab Take 650 mg by mouth every 6 hours as needed for pain.     . Aspirin 81 MG Oral Tab Take 81 mg by mouth daily.     . Calcium Carbonate 500 MG Oral Chew Tab Chew and swallow 1,000 mg by mouth 2 times a day.     . Cholecalciferol 1000 units Oral Tab Take 1,000 Units by mouth daily.     Mariane Baumgarten Sodium 250 MG Oral Cap Take 250 mg by mouth 2 times a day.     . Furosemide 20 MG Oral Tab Take 1 tablet (20 mg) by mouth every 48 hours. (Patient not taking: Reported on 05/13/2016) 15 tablet 2   . Gabapentin 300 MG Oral Cap Take 300 mg by mouth 3 times a day.     . Magnesium Oxide 400 MG Oral Tab Take 400 mg by mouth 2 times a day.     . Multiple Vitamins-Minerals (MULTIVITAMIN & MINERAL OR) Take  1 tablet by mouth daily.     . Mycophenolate Mofetil 500 MG Oral Tab Take 2 tablets (1,000 mg) by mouth 2 times a day. (Patient taking differently: Take 500 mg by mouth 2 times a day. ) 120 tablet 11   . OxyCODONE HCl, Abuse Deter, 5 MG Oral Tablet Abuse-Deterrent Take 5-10 mg by mouth every 4 hours as needed for pain.     . Pantoprazole Sodium 40 MG Oral Tab EC Take 1 tablet (40 mg) by mouth daily. 30 tablet 11   . Polyethylene Glycol 3350 Oral Powder Take 17 g by mouth daily. Fill cap with powder to the 17 gram mark and dissolve in 4 to 8 ounces of water.     . Pravastatin Sodium (PRAVACHOL) 20 MG Oral Tab Take 20 mg by mouth every evening.     . PredniSONE 10 MG Oral Tab Take 1 tablet (10 mg) by mouth daily. 90 tablet 3   . Sulfamethoxazole-Trimethoprim 400-80 MG Oral Tab Take 1 tablet by mouth daily.     . Tacrolimus 1 MG Oral Cap Take 1 capsule (1 mg) by mouth every 12 hours. Total dose, along with 5 mg capsules , is 6 mg every 12 hours. 60 capsule 11   . Tacrolimus 5 MG Oral Cap Take 1 capsule (5 mg) by mouth every 12 hours. 60 capsule 11   .  ValACYclovir HCl 1 g Oral Tab 1 tablet three times daily for 7 days then decrease to 1 tablet daily 90 tablet 3   . Venlafaxine HCl 100 MG Oral Tab Take 100 mg by mouth daily.        No current facility-administered medications for this visit.    Labs: Na 141 K+ 4.2 Glu 104 Creat .84 GFR > 60 Ca 8.8 Mg 1.9 Phos 2.8 Alb 3.6 WBC 3.66 Hct 35  05/13/2016  Vitamin D: 27.6 ng/ml 09/04/2015    Nutrient Requirements: 2000+- kcal, 80-85 gm protein (60-65 gm coming from HBV sources)    Nutrition Assessment:  Evaluation of intake: Inadequate at this time to promote weight gain of muscle mass.  Ongoing use of oral supplements encouraged with higher calorie content (350+ kcal, 13-16 gm protein)  Ability to meet nutrient requirements: Yes with daily use of nutrient dense supplements  Evaluation of nutrition status: Moderate to high nutritional risk 2/2 medical issues and co morbidities including unintended weight loss and daily intake of salad bar at [employer].  Labs reflect anemia and vitamin D depletion   Demonstrated understanding, willingness and ability to improve nutritional status: Yes    Education Provided:  Meeting Nutrient Requirements-focus on adequate calories and protein to meet nutrient targets.  Magnesium rich foods also reviewed  Use of Oral Supplements-+ products encouraged and samples along with coupons provided.  Also encouraged increasing vitamin D intake and iron in MVI to help replete RBC and correct anemia.  May benefit from extra iron supplement daily  Nutrition Transplant Teaching-FOOD SAFETY-NO Tracey Harries  Written materials resources provided: Yes      Demonstrated understanding of education: Yes    Barriers: None  Education was provided taking into consideration the patient's learning abilities, readiness to learn and cultural beliefs    Goals of nutrition therapy:  Weight stability, anabolism, correction of labs, adequate intake of all nutrients    Plan:  RD contact information provided to patient  and will contact him to follow up regarding use of oral supplements  Will see patient at next visit in 3  months to complete teaching transplant teaching    Asencion Noble, [place]  Registered Dietitian  [phone]

## 2016-05-13 ENCOUNTER — Ambulatory Visit (HOSPITAL_BASED_OUTPATIENT_CLINIC_OR_DEPARTMENT_OTHER): Payer: Medicare Other | Admitting: Registered"

## 2016-05-13 ENCOUNTER — Ambulatory Visit: Payer: Medicare Other | Attending: Internal Medicine | Admitting: Pulmonary Disease

## 2016-05-13 ENCOUNTER — Other Ambulatory Visit (HOSPITAL_BASED_OUTPATIENT_CLINIC_OR_DEPARTMENT_OTHER): Payer: Self-pay | Admitting: Internal Medicine

## 2016-05-13 ENCOUNTER — Other Ambulatory Visit (HOSPITAL_COMMUNITY): Payer: Self-pay

## 2016-05-13 ENCOUNTER — Ambulatory Visit (HOSPITAL_BASED_OUTPATIENT_CLINIC_OR_DEPARTMENT_OTHER): Payer: Medicare Other

## 2016-05-13 VITALS — BP 147/82 | HR 79 | Temp 98.8°F | Ht 69.0 in | Wt 161.0 lb

## 2016-05-13 VITALS — Ht 68.62 in | Wt 160.9 lb

## 2016-05-13 DIAGNOSIS — Z4824 Encounter for aftercare following lung transplant: Secondary | ICD-10-CM

## 2016-05-13 DIAGNOSIS — Z942 Lung transplant status: Secondary | ICD-10-CM | POA: Insufficient documentation

## 2016-05-13 DIAGNOSIS — D899 Disorder involving the immune mechanism, unspecified: Secondary | ICD-10-CM | POA: Insufficient documentation

## 2016-05-13 DIAGNOSIS — D649 Anemia, unspecified: Secondary | ICD-10-CM | POA: Insufficient documentation

## 2016-05-13 DIAGNOSIS — D849 Immunodeficiency, unspecified: Secondary | ICD-10-CM

## 2016-05-13 DIAGNOSIS — Z23 Encounter for immunization: Secondary | ICD-10-CM | POA: Insufficient documentation

## 2016-05-13 DIAGNOSIS — R7989 Other specified abnormal findings of blood chemistry: Secondary | ICD-10-CM | POA: Insufficient documentation

## 2016-05-13 DIAGNOSIS — B029 Zoster without complications: Secondary | ICD-10-CM | POA: Insufficient documentation

## 2016-05-13 DIAGNOSIS — Z6823 Body mass index (BMI) 23.0-23.9, adult: Secondary | ICD-10-CM

## 2016-05-13 LAB — PULMONARY FUNCTION TESTING
FEF25-75-%Pred-Pre: 67
FEF25-75-Pre: 2.16
FEV1-%Pred-Pre: 81
FEV1-Pre: 2.79
FEV1/FVC-Pre: 74
FVC-%Pred-Pre: 86
FVC-Pre: 3.77

## 2016-05-13 LAB — HEPATITIS B SURFACE AB
Hepatitis B Surf Antibody Intl Units: <8 [IU]
Hepatitis B Surface Ab: NONREACTIVE

## 2016-05-13 LAB — TACROLIMUS BY LCMSMS: Tacrolimus By LCMSMS: 5.3 ng/mL

## 2016-05-13 MED ORDER — VALACYCLOVIR HCL 1 G OR TABS
ORAL_TABLET | ORAL | 3 refills | Status: DC
Start: 2016-05-13 — End: 2016-10-28

## 2016-05-13 NOTE — Progress Notes (Signed)
Vaccine Screening Questions: [facility]        1. I feel sick today.   NO    1. There is no evidence that acute illness reduces vaccine efficacy or increases vaccine adverse events. However, with moderate or severe illness, all vaccines should be delayed until the illness has improved. Mild illness (e.g., upperrespiratory infection or diarrhea) are NOT contraindications to vaccination. Do not withhold vaccination if a person is taking antibiotic.    2. Are you allergic to eggs, baker's yeast, gelatin, streptomycin, neomycin, formaldehyde,merthiolate / thimerosal? (Please circle all that apply)  NO    2. Please see Vaccine Excipient Table below at end of form.**    3. Are you currently taking "blood thinning medications" such as Warfarin (Coumadin), Enoxaparin (Lovenox, Dalteparin(Fragmin), Fondiparinux(Arixtra), Aspirin, Clopidogrel(Plavix), Prasugrel(Effient), Dipyridamol(Persantine), Ticagrelor(Brilinta), Cilostazol(Pletal), Aggrenox, Rivaroxaban(Xarelto), Dabigatran(Xarelto)  NO    3.  Patients who are currently receiving these medications need to advise their providers so that we may take specific precautions when giving intramuscular vaccines. Administer using a 23-gauge (or smaller) needle and apply pressure to  the vaccine site for 2 minutes after administration. DO NOT RUB the vaccine site. Patients should be counseled on monitoring for signs and symptoms of hematoma formation at the site of the injection.    4. Do you have allergies to medications, food, or any vaccine?   If yes, please list:@ALLERGIES@  NO    4. History of anaphylactic reaction such as hives, wheezing or difficulty breathing, or circulatory collapse or shock (not fainting) from a previous dose of vaccine or vaccine component is a contraindication for further doses. For example, if a person  experiences anaphylaxis after eating eggs, do not administer influenza vaccine, or if a person has anaphylaxis after eating  gelatin, do not administer MMR or varicella vaccine. Local reactions (e.g., a red eye following instillation of ophthalmic solution) are not contraindications. Please see Vaccine Excipient Table enclosed on order form.     5. Have you ever had a severe reaction to any vaccine? If yes, please describe:  NO    5.  History of anaphylactic reaction to a previous dose of vaccine or vaccine component is a contraindication for subsequent  doses. Under normal circumstances, vaccines are deferred when a precaution is present. However, situations may arise  when the benefit outweighs the risk (e.g., community measles outbreak).    6. Are you currently undergoing radiation treatments?  NO    6.  If patient is undergoing radiation therapy, inactivated vaccine is no problem. For live virus vaccines contact patient's PCP prior to administration.    7. Have you had Immune Globulin (Gamma Globulin) or received blood or blood products in the past seven months?  NO    7. Live virus vaccines may need to be deferred, depending on several variables. Please see the ACIP Statement "General Recommendations on Immunization" on page 1 of the PINK BOOK and APPENDIX A for suggested intervals between administration of blood products and live virus vaccines.    8. Have you ever had Guillian-Barre Syndrome, a condition that causes paralysis?  NO    8. If the patient has had this condition, hold the vaccination and call the MD.    9.  Are you pregnant or planning a pregnancy in the next three months?  NO    9. Live virus vaccines are contraindicated prior to and during pregnancy due to the possible risk of virus transmission to the fetus. Instruct patients who are sexually active   in their childbearing years to practice careful contraception for one month  after receiving the MMR, rubella, varicella, or intranasal influenza vaccination.    10. Have you received any vaccinations in the past 4 weeks?  NO    10. You may either administer two live  vaccines on the same day OR separate them by 28 days. Live vaccines must replicate in order for the immune system to mount a response. Separating live vaccine administrations allows for less interference of  viral replication. On the other hand, inactivated vaccines may be given together or at any spacing interval.    11. Are you allergic to latex products? (If yes, please describe reaction):  NO    11. If the patient has a true latex allergy, call the inpatient pharmacy ([phone]) and they will provide a latex-free immunization.          Adapted from "Understanding the Screening Questionnaire for Adult Immunization", Immunization Action Coalition, www.immunize.org.   Updated June 2014    **VACCINE EXCIPIENT AND MEDIA SUMMARY  Excipient Use Vaccine    Egg protein: Influenza (all brands)  Formaldehyde, formalin:  DTaP (all brands), Td (all brands), Hepatitis A (Havrix, Vaqta), Hepatitis A-hepatitis B (Twinrix), Influenza (Fluogen, FluShield, Fluzone)  Gelatin: Influenza (Fluzone), Measles (Attenuvax), Mumps (Mumpsvax),  Rubella (Meruvax II), MMR (MMRII), Typhoid oral (Vivotif), Varicella (Varivax)  Neomycin:  Measles (Attenuvax), Mumps (Mumpsvax), Rubella (Meruvax II),  MMR (MMR-II), Rabies (Imovax), Influenza (Fluvirin)  Phenol: Pneumococcal polysaccharide (Pneumovax 23)  Streptomycin: Influenza (Fluogen)  Thimerosal: Influenza (all brands except the intranasal vaccine), Pneumococcal  polysaccharide (Pneu-Immune 23), Meningococcal (Menomune)  Yeast: Hepatitis B (Engerix-B, Recombivax-HB)    Adapted from "Vaccine Excipient & Media Summary", ImmunoFacts, Facts and Comparisons,  February 2001 and Offit PA, Jew RK. Addressing parents' concerns: do vaccines contain harmful  preservatives, adjuvants, additives, or residuals? Pediatrics.2003;112(6);1394-7.  Updated June 2014

## 2016-05-13 NOTE — Progress Notes (Signed)
Lung Transplant Clinic Visit    Date of Service:  05/14/2016    Chief Complaint  Post-lung transplant care    History of Present Illness  Date of transplant: 09/05/2015  Type of transplant: bilateral  Underlying disease: COPD  CMV status: D+/R+  EBV status: D+/R+  Complications:   - sternal dehiscence, requiring re-op and sternal plating 10/03/15    Dakota Marsh presents for scheduled follow-up. He was last seen by Dr. Waymond Cera  In July. Since then, Dakota Marsh developed dermatomal shingles along the lateral and posterior aspect of his left chest. He was initially started on acyclovir plus gabapentin for pain by his outside provider. He subsequently developed CMV reactivation in his blood stream and was started on valganciclovir 8/31 with discontinuation of acyclovir. Dakota Marsh rapidly cleared his CMV and valganciclovir was discontinued on 9/22. Because of neutropenia during valganciclovir course, MMF was reduced by 50%.    Today, Dakota Marsh primarily complains of severe pain over the area of skin affected with shingles. It is causing difficulty with sleeping. He denies dyspnea or significant cough. He denies fevers or night sweats. Because of pain, poor sleep, and fatigue, Dakota Marsh was forced to quit his new job at [employer].    A complete review of systems is otherwise negative.    Past Medical History  SP- BOLT 09/05/15 for COPD (CMV+/+, EBV+/+)                  - Explant M. 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 Living by himself.    Physical Exam  Vitals:    05/13/16 0940   BP: 147/82   BP Cuff Size: Regular      BP Site: Right Arm   BP Position: Sitting   Pulse: 79   Temp: 98.8 F (37.1 C)   TempSrc: Temporal   SpO2: 99%   Weight: 161 lb (73 kg)   Height: 5\' 9"  (1.753 m)     Gen - middle age man in no distress  Skin - dermatomal rash over left chest wall characterized by edema and erythema. No intact or scabbed over vesicles noted  ENT - oropharynx without thrush or other mucosal lesions  Neck - no submandibular, cervical, or supraclavicular adenopathy  Lungs - clear to auscultuation bilaterally, no inspiratory crackles or expiratory wheezes  Card - RRR, no gallop appreciated  Ext - no digital clubbing, cyanosis; LE edema - none    Diagnostic Studies  Chest radiograph - no change to my review as compared with July    Spirometry   Date  05/13/2016   FVC -   3.77/86%   FEV1 -  2.79/81%   ratio  0.74   FEF25-75 2.16/67%   Interpretation:  Normal spirometry, non-significant decline in FVC of 0.33 L as compared with July    Labs   BUN  28   Creatinine 0.84   WBC  11.16   Hct  35   Platelet 217   AST  17   ALT  18    Assessment/Plan  1) Lung transplant - 9 months post-BOLT doing well except for reactivation of Varicella and CMV   - continue prednisone at 10mg  daily   - continue Cellcept at 500mg  q12, dose reduced secondary to low WBC and shingles and CMV   - follow-up on tacrolimus level, goal 8-10    2) Renal function - stable   - encourage oral hydration    3) [place] - although shingles eruption is now a month old, the lack of rash resolution and patient's immunosuppression prompted me to contact transplant ID. They recommended the following course with which I agree   - valacyclovir 1 gram tid (better bioavailability than acyclovir) for 1 week   - valaciclovir 1 gram daily prophylaxis for at least first year    4) CAD   - daily ASA   - pravastatin   - followed by cardiology    5) Health maintenance   - annual skin exam   - annual eye exam   - PCP prophylaxis - tmp/smx   - viral prophylaxis - valacyclovir   - influenza  vaccination    Follow-up  3 months

## 2016-05-14 LAB — LUNG TRX, POST OP EXTENDED PANEL
% Basophils: 0 %
% Eosinophils: 0 %
% Immature Granulocytes: 1 %
% Lymphocytes: 18 %
% Monocytes: 13 %
% Neutrophils: 68 %
% Nucleated RBC: 0 %
ALT (GPT): 18 U/L (ref 10–48)
AST (GOT): 17 U/L (ref 9–38)
Absolute Eosinophil Count: 0.01 10*3/uL (ref 0.00–0.50)
Absolute Lymphocyte Count: 1.99 10*3/uL (ref 1.00–4.80)
Albumin: 3.6 g/dL (ref 3.5–5.2)
Alkaline Phosphatase (Total): 85 U/L (ref 36–161)
Anion Gap: 5 (ref 4–12)
Basophils: 0.02 10*3/uL (ref 0.00–0.20)
Bilirubin (Total): 0.3 mg/dL (ref 0.2–1.3)
CMV Quant Result: NOT DETECTED [IU]/mL
Calcium: 8.8 mg/dL — ABNORMAL LOW (ref 8.9–10.2)
Carbon Dioxide, Total: 31 meq/L (ref 22–32)
Chloride: 105 meq/L (ref 98–108)
Creatinine: 0.84 mg/dL (ref 0.51–1.18)
GFR, Calc, African American: >60 mL/min/{1.73_m2} (ref >59)
GFR, Calc, European American: >60 mL/min/{1.73_m2} (ref >59)
Glucose: 104 mg/dL (ref 62–125)
Hematocrit: 35 % — ABNORMAL LOW (ref 38–50)
Hemoglobin: 10.7 g/dL — ABNORMAL LOW (ref 13.0–18.0)
Immature Granulocytes: 0.12 10*3/uL — ABNORMAL HIGH (ref 0.00–0.05)
MCH: 29.2 pg (ref 27.3–33.6)
MCHC: 30.8 g/dL — ABNORMAL LOW (ref 32.2–36.5)
MCV: 95 fL (ref 81–98)
Magnesium: 1.9 mg/dL (ref 1.8–2.4)
Monocytes: 1.43 10*3/uL — ABNORMAL HIGH (ref 0.00–0.80)
Neutrophils: 7.59 10*3/uL — ABNORMAL HIGH (ref 1.80–7.00)
Nucleated RBC: 0 10*3/uL
Phosphate: 2.8 mg/dL (ref 2.5–4.5)
Platelet Count: 217 10*3/uL (ref 150–400)
Potassium: 4.2 meq/L (ref 3.6–5.2)
Protein (Total): 6.3 g/dL (ref 6.0–8.2)
RBC: 3.66 10*6/uL — ABNORMAL LOW (ref 4.40–5.60)
RDW-CV: 14.7 % — ABNORMAL HIGH (ref 11.6–14.4)
Sodium: 141 meq/L (ref 135–145)
Urea Nitrogen: 28 mg/dL — ABNORMAL HIGH (ref 8–21)
WBC: 11.16 10*3/uL — ABNORMAL HIGH (ref 4.3–10.0)

## 2016-05-28 ENCOUNTER — Other Ambulatory Visit (HOSPITAL_COMMUNITY): Payer: Self-pay

## 2016-05-28 ENCOUNTER — Encounter (HOSPITAL_BASED_OUTPATIENT_CLINIC_OR_DEPARTMENT_OTHER): Payer: Self-pay

## 2016-05-28 LAB — BASIC METABOLIC PANEL, EXTERNAL
Calcium, External: 8.8
Carbon Dioxide (Total), External: 29
Chloride, External: 107
Creatinine, External: 0.96
Glucose, External: 92
Potassium, External: 3.8
Sodium, External: 144
Urea Nitrogen, External: 20

## 2016-05-28 LAB — TACROLIMUS, EXTERNAL: Tacrolimus, External: 5.5

## 2016-05-28 LAB — CMV_PCR QUANT, EXTERNAL: CMV Quant Result, External: NOT DETECTED

## 2016-05-29 ENCOUNTER — Encounter (HOSPITAL_BASED_OUTPATIENT_CLINIC_OR_DEPARTMENT_OTHER): Payer: Self-pay

## 2016-06-01 ENCOUNTER — Other Ambulatory Visit (HOSPITAL_BASED_OUTPATIENT_CLINIC_OR_DEPARTMENT_OTHER): Payer: Self-pay

## 2016-06-01 DIAGNOSIS — Z942 Lung transplant status: Secondary | ICD-10-CM

## 2016-06-01 MED ORDER — FLUCONAZOLE 200 MG OR TABS
200.0000 mg | ORAL_TABLET | Freq: Every day | ORAL | 3 refills | Status: DC
Start: 2016-06-01 — End: 2017-07-27

## 2016-06-02 ENCOUNTER — Other Ambulatory Visit (HOSPITAL_COMMUNITY): Payer: Self-pay

## 2016-06-03 ENCOUNTER — Other Ambulatory Visit (HOSPITAL_BASED_OUTPATIENT_CLINIC_OR_DEPARTMENT_OTHER): Payer: Self-pay

## 2016-06-03 DIAGNOSIS — Z942 Lung transplant status: Secondary | ICD-10-CM

## 2016-06-03 DIAGNOSIS — D849 Immunodeficiency, unspecified: Secondary | ICD-10-CM

## 2016-06-03 MED ORDER — TACROLIMUS 1 MG OR CAPS
ORAL_CAPSULE | ORAL | 11 refills | Status: DC
Start: 2016-06-03 — End: 2016-06-30

## 2016-06-05 ENCOUNTER — Other Ambulatory Visit (HOSPITAL_BASED_OUTPATIENT_CLINIC_OR_DEPARTMENT_OTHER): Payer: Self-pay | Admitting: Transplant

## 2016-06-05 DIAGNOSIS — J449 Chronic obstructive pulmonary disease, unspecified: Secondary | ICD-10-CM

## 2016-06-09 ENCOUNTER — Encounter (HOSPITAL_BASED_OUTPATIENT_CLINIC_OR_DEPARTMENT_OTHER): Payer: Self-pay

## 2016-06-09 ENCOUNTER — Other Ambulatory Visit (HOSPITAL_COMMUNITY): Payer: Self-pay

## 2016-06-09 LAB — BASIC METABOLIC PANEL, EXTERNAL
Calcium, External: 9
Carbon Dioxide (Total), External: 29
Chloride, External: 105
Creatinine, External: 0.9
Glucose, External: 138
Potassium, External: 4.7
Sodium, External: 143
Urea Nitrogen, External: 23

## 2016-06-09 LAB — CBC (HEMOGRAM), EXTERNAL
Hematocrit, External: 37.4
Platelet Count, External: 282
WBC, External: 3.79

## 2016-06-09 LAB — HEPATIC FUNCTION PANEL, EXTERNAL
ALT (GPT), External: 42
AST (GOT), External: 25
Albumin, External: 3.8
Bilirubin (Total), External: 0.2
Protein (Total), External: 7

## 2016-06-09 LAB — CBC, DIFF, EXTERNAL: Neutrophils, External: 0.68

## 2016-06-09 LAB — TACROLIMUS, EXTERNAL: Tacrolimus, External: 4.4

## 2016-06-10 ENCOUNTER — Encounter (HOSPITAL_BASED_OUTPATIENT_CLINIC_OR_DEPARTMENT_OTHER): Payer: Self-pay

## 2016-06-11 ENCOUNTER — Encounter (HOSPITAL_BASED_OUTPATIENT_CLINIC_OR_DEPARTMENT_OTHER): Payer: Self-pay

## 2016-06-11 ENCOUNTER — Other Ambulatory Visit (HOSPITAL_COMMUNITY): Payer: Self-pay

## 2016-06-11 ENCOUNTER — Other Ambulatory Visit (HOSPITAL_BASED_OUTPATIENT_CLINIC_OR_DEPARTMENT_OTHER): Payer: Self-pay | Admitting: Transplant

## 2016-06-12 ENCOUNTER — Encounter (HOSPITAL_COMMUNITY): Payer: Self-pay

## 2016-06-12 NOTE — Progress Notes (Signed)
Tacrolimus dose change per [facility] transplant clinic note

## 2016-06-17 ENCOUNTER — Other Ambulatory Visit (HOSPITAL_COMMUNITY): Payer: Self-pay

## 2016-06-17 LAB — BASIC METABOLIC PANEL, EXTERNAL
Calcium, External: 8.9
Carbon Dioxide (Total), External: 30
Chloride, External: 104
Creatinine, External: 0.86
Glucose, External: 129
Potassium, External: 4.1
Sodium, External: 142
Urea Nitrogen, External: 25

## 2016-06-17 LAB — TACROLIMUS, EXTERNAL: Tacrolimus, External: 7.5

## 2016-06-18 ENCOUNTER — Encounter (HOSPITAL_BASED_OUTPATIENT_CLINIC_OR_DEPARTMENT_OTHER): Payer: Self-pay

## 2016-06-24 ENCOUNTER — Other Ambulatory Visit (HOSPITAL_COMMUNITY): Payer: Self-pay

## 2016-06-24 ENCOUNTER — Encounter (HOSPITAL_BASED_OUTPATIENT_CLINIC_OR_DEPARTMENT_OTHER): Payer: Self-pay

## 2016-06-24 LAB — CMV_PCR QUANT, EXTERNAL: CMV Quant Result, External: NOT DETECTED

## 2016-06-24 LAB — CBC (HEMOGRAM), EXTERNAL
Hematocrit, External: 35.3
Platelet Count, External: 267
WBC, External: 7.31

## 2016-06-24 LAB — CBC, DIFF, EXTERNAL: Neutrophils, External: 3.68

## 2016-06-25 ENCOUNTER — Encounter (HOSPITAL_BASED_OUTPATIENT_CLINIC_OR_DEPARTMENT_OTHER): Payer: Self-pay

## 2016-06-26 ENCOUNTER — Encounter (HOSPITAL_BASED_OUTPATIENT_CLINIC_OR_DEPARTMENT_OTHER): Payer: Self-pay

## 2016-06-30 ENCOUNTER — Other Ambulatory Visit (HOSPITAL_BASED_OUTPATIENT_CLINIC_OR_DEPARTMENT_OTHER): Payer: Self-pay | Admitting: Internal Medicine

## 2016-06-30 DIAGNOSIS — Z942 Lung transplant status: Secondary | ICD-10-CM

## 2016-06-30 DIAGNOSIS — D849 Immunodeficiency, unspecified: Secondary | ICD-10-CM

## 2016-06-30 MED ORDER — TACROLIMUS 1 MG OR CAPS
3.0000 mg | ORAL_CAPSULE | Freq: Two times a day (BID) | ORAL | 11 refills | Status: DC
Start: 2016-06-30 — End: 2016-10-05

## 2016-07-15 ENCOUNTER — Other Ambulatory Visit (HOSPITAL_COMMUNITY): Payer: Self-pay

## 2016-07-15 LAB — BASIC METABOLIC PANEL, EXTERNAL
Calcium, External: 9.2
Carbon Dioxide (Total), External: 30
Chloride, External: 107
Creatinine, External: 0.85
Glucose, External: 115
Potassium, External: 3.9
Sodium, External: 143
Urea Nitrogen, External: 17

## 2016-07-15 LAB — CBC (HEMOGRAM), EXTERNAL
Hematocrit, External: 35.7
Platelet Count, External: 244
WBC, External: 5.41

## 2016-07-15 LAB — TACROLIMUS, EXTERNAL: Tacrolimus, External: 7.5

## 2016-07-15 LAB — CBC, DIFF, EXTERNAL: Neutrophils, External: 2.34

## 2016-07-15 LAB — CMV_PCR QUANT, EXTERNAL: CMV Quant Result, External: UNDETERMINED

## 2016-07-16 ENCOUNTER — Encounter (HOSPITAL_BASED_OUTPATIENT_CLINIC_OR_DEPARTMENT_OTHER): Payer: Self-pay

## 2016-07-17 ENCOUNTER — Encounter (HOSPITAL_BASED_OUTPATIENT_CLINIC_OR_DEPARTMENT_OTHER): Payer: Self-pay

## 2016-08-17 ENCOUNTER — Telehealth (HOSPITAL_BASED_OUTPATIENT_CLINIC_OR_DEPARTMENT_OTHER): Payer: Self-pay

## 2016-08-17 NOTE — Telephone Encounter (Signed)
From: Tildon Husky [[email or web address]]   Sent: Monday, August 17, 2016 2:34 PM  To: [facility]  Subject: Re: RW    Thanks Reggie. Should at least get a CXR to make sure plate is ok.    Sid    From: [facility] [email or web address]  Sent: Monday, August 17, 2016 1:34:13 PM  To: Tildon Husky  Subject: RW      Dakota Marsh called to report worsening chest discomfort. Pt states that the pain is primarily on the left side. The pain has been present since his transplant surgery, but has become more severe and frequent over the past several days. Pt describes the pain as "aching" and does not notice anything that makes the pain better or worse (deep breaths, positioning, palpation). The pain is localized to the left side, does not radiate. Pt states that "it hurts where the plate was placed. just to the left of my sternum." No changes with his breathing - pt denies increased SOB or any new respiratory symptoms. The patient does not recall any recent injury or strenuous activity. He has been using APAP with minimal relief and he finally took some leftover pain meds because the pain became so severe.     The pt is reaching out to his local PCP for evaluation. Instructed pt to keep [place] apprised of findings.

## 2016-08-19 ENCOUNTER — Encounter (HOSPITAL_BASED_OUTPATIENT_CLINIC_OR_DEPARTMENT_OTHER): Payer: Medicare Other

## 2016-08-20 ENCOUNTER — Encounter (HOSPITAL_BASED_OUTPATIENT_CLINIC_OR_DEPARTMENT_OTHER): Payer: Self-pay

## 2016-08-25 ENCOUNTER — Other Ambulatory Visit (HOSPITAL_BASED_OUTPATIENT_CLINIC_OR_DEPARTMENT_OTHER): Payer: Self-pay | Admitting: Internal Medicine

## 2016-08-25 ENCOUNTER — Other Ambulatory Visit (HOSPITAL_BASED_OUTPATIENT_CLINIC_OR_DEPARTMENT_OTHER): Payer: Self-pay | Admitting: Pulmonary Disease

## 2016-08-25 DIAGNOSIS — Z942 Lung transplant status: Secondary | ICD-10-CM

## 2016-08-25 DIAGNOSIS — Z48298 Encounter for aftercare following other organ transplant: Secondary | ICD-10-CM

## 2016-08-25 MED ORDER — SULFAMETHOXAZOLE-TRIMETHOPRIM 400-80 MG OR TABS
1.0000 | ORAL_TABLET | Freq: Every day | ORAL | 11 refills | Status: DC
Start: 2016-08-25 — End: 2018-06-29

## 2016-08-26 ENCOUNTER — Encounter (HOSPITAL_BASED_OUTPATIENT_CLINIC_OR_DEPARTMENT_OTHER): Payer: Medicare Other

## 2016-08-31 ENCOUNTER — Other Ambulatory Visit (HOSPITAL_COMMUNITY): Payer: Self-pay

## 2016-08-31 ENCOUNTER — Ambulatory Visit (HOSPITAL_BASED_OUTPATIENT_CLINIC_OR_DEPARTMENT_OTHER): Payer: Medicare Other

## 2016-08-31 ENCOUNTER — Ambulatory Visit (HOSPITAL_BASED_OUTPATIENT_CLINIC_OR_DEPARTMENT_OTHER): Payer: Medicare Other | Admitting: Transplant

## 2016-08-31 ENCOUNTER — Other Ambulatory Visit (HOSPITAL_BASED_OUTPATIENT_CLINIC_OR_DEPARTMENT_OTHER): Payer: Self-pay | Admitting: Pulmonary Disease

## 2016-08-31 ENCOUNTER — Other Ambulatory Visit (HOSPITAL_BASED_OUTPATIENT_CLINIC_OR_DEPARTMENT_OTHER): Payer: Self-pay

## 2016-08-31 ENCOUNTER — Encounter (HOSPITAL_COMMUNITY): Payer: Self-pay

## 2016-08-31 ENCOUNTER — Ambulatory Visit: Payer: Medicare Other | Attending: Transplant | Admitting: Thoracic Surgery (Cardiothoracic Vascular Surgery)

## 2016-08-31 VITALS — BP 141/85 | HR 88 | Temp 98.2°F | Ht 69.0 in | Wt 191.1 lb

## 2016-08-31 VITALS — BP 140/89 | HR 90 | Temp 97.3°F | Ht 69.02 in | Wt 191.4 lb

## 2016-08-31 DIAGNOSIS — Z942 Lung transplant status: Secondary | ICD-10-CM

## 2016-08-31 DIAGNOSIS — R942 Abnormal results of pulmonary function studies: Secondary | ICD-10-CM

## 2016-08-31 DIAGNOSIS — T8132XA Disruption of internal operation (surgical) wound, not elsewhere classified, initial encounter: Secondary | ICD-10-CM | POA: Insufficient documentation

## 2016-08-31 DIAGNOSIS — Z4824 Encounter for aftercare following lung transplant: Secondary | ICD-10-CM

## 2016-08-31 DIAGNOSIS — T8132XD Disruption of internal operation (surgical) wound, not elsewhere classified, subsequent encounter: Secondary | ICD-10-CM | POA: Insufficient documentation

## 2016-08-31 DIAGNOSIS — Z48298 Encounter for aftercare following other organ transplant: Secondary | ICD-10-CM | POA: Insufficient documentation

## 2016-08-31 DIAGNOSIS — D849 Immunodeficiency, unspecified: Secondary | ICD-10-CM

## 2016-08-31 DIAGNOSIS — D899 Disorder involving the immune mechanism, unspecified: Secondary | ICD-10-CM

## 2016-08-31 DIAGNOSIS — G8918 Other acute postprocedural pain: Secondary | ICD-10-CM

## 2016-08-31 DIAGNOSIS — R918 Other nonspecific abnormal finding of lung field: Secondary | ICD-10-CM

## 2016-08-31 DIAGNOSIS — R0789 Other chest pain: Secondary | ICD-10-CM

## 2016-08-31 DIAGNOSIS — Z6828 Body mass index (BMI) 28.0-28.9, adult: Secondary | ICD-10-CM

## 2016-08-31 DIAGNOSIS — R5381 Other malaise: Secondary | ICD-10-CM

## 2016-08-31 DIAGNOSIS — X58XXXD Exposure to other specified factors, subsequent encounter: Secondary | ICD-10-CM | POA: Insufficient documentation

## 2016-08-31 LAB — LUNG TRX, POST OP EXTENDED PANEL
% Basophils: 0 %
% Eosinophils: 1 %
% Immature Granulocytes: 1 %
% Lymphocytes: 23 %
% Monocytes: 10 %
% Neutrophils: 65 %
% Nucleated RBC: 0 %
ALT (GPT): 13 U/L (ref 10–48)
AST (GOT): 17 U/L (ref 9–38)
Absolute Eosinophil Count: 0.09 10*3/uL (ref 0.00–0.50)
Absolute Lymphocyte Count: 1.86 10*3/uL (ref 1.00–4.80)
Albumin: 3.9 g/dL (ref 3.5–5.2)
Alkaline Phosphatase (Total): 75 U/L (ref 36–161)
Anion Gap: 7 (ref 4–12)
Basophils: 0.02 10*3/uL (ref 0.00–0.20)
Bilirubin (Total): 0.3 mg/dL (ref 0.2–1.3)
CMV DNA IU/mL (Log10): 2.3
CMV Quant Result: 180 [IU]/mL — AB
Calcium: 8.9 mg/dL (ref 8.9–10.2)
Carbon Dioxide, Total: 28 meq/L (ref 22–32)
Chloride: 104 meq/L (ref 98–108)
Creatinine: 0.87 mg/dL (ref 0.51–1.18)
GFR, Calc, African American: >60 mL/min/{1.73_m2} (ref >59)
GFR, Calc, European American: >60 mL/min/{1.73_m2} (ref >59)
Glucose: 115 mg/dL (ref 62–125)
Hematocrit: 36 % — ABNORMAL LOW (ref 38–50)
Hemoglobin: 11.5 g/dL — ABNORMAL LOW (ref 13.0–18.0)
Immature Granulocytes: 0.07 10*3/uL — ABNORMAL HIGH (ref 0.00–0.05)
MCH: 29.5 pg (ref 27.3–33.6)
MCHC: 32.3 g/dL (ref 32.2–36.5)
MCV: 91 fL (ref 81–98)
Magnesium: 1.6 mg/dL — ABNORMAL LOW (ref 1.8–2.4)
Monocytes: 0.76 10*3/uL (ref 0.00–0.80)
Neutrophils: 5.23 10*3/uL (ref 1.80–7.00)
Nucleated RBC: 0 10*3/uL
Phosphate: 2.5 mg/dL (ref 2.5–4.5)
Platelet Count: 269 10*3/uL (ref 150–400)
Potassium: 3.5 meq/L — ABNORMAL LOW (ref 3.6–5.2)
Protein (Total): 6.4 g/dL (ref 6.0–8.2)
RBC: 3.9 10*6/uL — ABNORMAL LOW (ref 4.40–5.60)
RDW-CV: 14.2 % (ref 11.6–14.4)
Sodium: 139 meq/L (ref 135–145)
Urea Nitrogen: 16 mg/dL (ref 8–21)
WBC: 8.03 10*3/uL (ref 4.3–10.0)

## 2016-08-31 LAB — PULMONARY FUNCTION TESTING
FEF25-75-%Pred-Pre: 46
FEF25-75-Pre: 1.47
FEV1-%Pred-Pre: 79
FEV1-Pre: 2.68
FEV1/FVC-Pre: 66
FVC-%Pred-Pre: 93
FVC-Pre: 4.07

## 2016-08-31 LAB — LAB ADD ON ORDER

## 2016-08-31 LAB — TACROLIMUS BY LCMSMS: Tacrolimus By LCMSMS: 5.3 ng/mL

## 2016-08-31 NOTE — Progress Notes (Signed)
THORACIC SURGERY CLINIC     Attending Physician: Janyce Llanos  Primary care provider: Dian Situ    Patient Identification  66 yo Male with COPD s/p BOLD 0000000 complicated by sternal dehiscence now s/p Sternal rewiring and plating 10/03/2015 presenting to clinic left parasternal chest pain      HPI  Mr Meller began having chest pain since his lung transplant in January of last year. He called our clinic reporting worsening of his aching pain which is now more severe and frequent. The pain is localaized to the left side and non radiating, located to the left of his sternum. He had reactivation of herpes with shingles in September of last year and has had aching pain since then, worse at the end of the day. He reports that he has pain now after driving. His pain has worsened and he is interested in removing his sternal hardware.     Review of Systems  A ten point review of systems was performed including consitutional, Neuro, ENT, CV, Respiratory, GI, GU, MSK, Skin, Psych and is negative except the pertinent positives mentioned in the HPI      Physical Exam  Gen:  Sitting up comfortably, NAD  HEENT: MMM  Pulm: Breathing comfortably, non wheezes or crackles  Chest: Well healed incision, point tenderness palpation at middle of clamshell incision to left of his sternum. Chest wall overall stable  Cardiac:RRR, no murmurs or thrills  Skin: Warm, no bruises  Neuro: alert and oriented  Psych: Appropriate, normal affect    Imaging:  Chest xray 09/01/2015:  FINDINGS:  Compared to 05/13/2016, lungs remain clear except for the mild atelectasis in left midlung and base.  Heart size is normal and unchanged. Aorta is tortuous and the arch is calcified, indicating atherosclerosis.  There are no vertebral compression fractures. There is a small eventration of the anterior right hemidiaphragm.      Assessment/Plan  Mr Sumrow is presenting with pain along his incision site to left of his sternum that is worse after activity, likely  related to his sternal hardware placed for sternal dehiscence. He does have stability of his sternum and has had his hardware in place for > 1 year. In comparison to his previous chest xrays, his hardware is stable and not mal aligned. No gas or surrounding change in soft tissue density.     Remove hardware  Very active, odd jobs though retired  Expected pain post op, other risks

## 2016-08-31 NOTE — Progress Notes (Signed)
THORACIC SURGERY CLINIC     Attending Physician: Janyce Llanos  Primary care provider: Dian Situ    Patient Identification  66 yo Male with COPD s/p BOLD 0000000 complicated by sternal dehiscence now s/p Sternal rewiring and plating 10/03/2015 presenting to clinic left parasternal chest pain      HPI  Dakota Marsh began having chest pain since his lung transplant in January of last year. He called our clinic reporting worsening of his aching pain which is now more severe and frequent. The pain is localaized to the left side and non radiating, located to the left of his sternum. He had reactivation of herpes with shingles in September of last year and has had aching pain since then, worse at the end of the day. He reports that he has pain now after driving. His pain has worsened and he is interested in removing his sternal hardware.     Review of Systems  A ten point review of systems was performed including consitutional, Neuro, ENT, CV, Respiratory, GI, GU, MSK, Skin, Psych and is negative except the pertinent positives mentioned in the HPI      Physical Exam  Gen:  Sitting up comfortably, NAD  HEENT: MMM  Pulm: Breathing comfortably, non wheezes or crackles  Chest: Well healed incision, point tenderness palpation at middle of clamshell incision to left of his sternum. Chest wall overall stable  Cardiac:RRR, no murmurs or thrills  Skin: Warm, no bruises  Neuro: alert and oriented  Psych: Appropriate, normal affect    Imaging:  Chest xray 09/01/2015:  FINDINGS:  Compared to 05/13/2016, lungs remain clear except for the mild atelectasis in left midlung and base.  Heart size is normal and unchanged. Aorta is tortuous and the arch is calcified, indicating atherosclerosis.  There are no vertebral compression fractures. There is a small eventration of the anterior right hemidiaphragm.      Assessment/Plan  Dakota Marsh is presenting with pain along his incision site to left of his sternum that is worse after activity, likely  related to his sternal hardware placed for sternal dehiscence. He does have stability of his sternum and has had his hardware in place for > 1 year. In comparison to his previous chest xrays, his hardware is stable and not mal aligned. No gas or surrounding change in soft tissue density. He was consented for sternal hardware removal. Risks and complications were also discussed including but not limited to the risks of infection, bleeding, injury to adjacent structures, anesthesia, DVT, PE, MI, CVA, anesthesia, and death. We will obtain a chest CT for preoperative planning

## 2016-08-31 NOTE — Progress Notes (Signed)
Patient is scheduled for sternal hardware removal tomorrow 09/01/16    People Present for teaching: Pt    Topic(s) Taught:     Peri-operative educational materials given to and reviewed with patient:     Medications to Avoid Before Surgery  Pain Management  Constipation After Your Operation  Recovering at Home After Anesthesia  About Your Surgery Experience  Information About [facility] about Lung surgery    Patient was instructed to stop taking all blood thinning medications 7 days before surgery. Patient was instructed not to shave any part of the body for 48 hours before surgery,  and not to use make-up, deodorant, lotions, hair products, or fragrances on the day of surgery. Patient was instructed to use antibacterial soap to shower or bathe from the neck  down both the night before surgery and again the morning of surgery before coming to the hospital. Soap is not to be used on the face or scalp. Patient was instructed to be NPO  after midnight the night before surgery. Patient was instructed to leave valuables at home.     Patient was instructed to go to nearest ED if experiencing life threatening signs and symptoms; these were reviewed. Patient instructed to have a responsible person as an escort  home after discharge from hospital after surgery; no driving, alcohol, operating machinery, caring for others, or signing legal documents while on narcotics.    Patient was provided phone numbers to call for questions or concerns and understands reasons to phone.     Patient will follow up in clinic TBD      Teaching Method(s) Used:    [x ] One-on-one teaching  [x ] Written materials    Response to Teaching/outcomes:  [x ] Voiced understanding  [x ] Repeated back  [ ]  Further instruction/reinforcement needed    All questions were answered. Patient was instructed to call clinic RN at [phone]  with further questions or concerns.

## 2016-08-31 NOTE — Addendum Note (Signed)
Addended byAlphonsa Overall on: 08/31/2016 05:55 PM     Modules accepted: Orders

## 2016-08-31 NOTE — Progress Notes (Signed)
[facility] PULMONARY POST-TRANSPLANT VISIT  DATE:  08/31/16    ID/CC:  [name] is a 66 year old man s/p BOLT 09/05/15 for COPD (CMV+/+, EBV+/+), following up today on immunosuppression and post-Tx status    HPI:  The transplant was notable for pleural scarring requiring extensive take down of adhesions. He presented shortly thereafter with a sternal dehiscence complicated by PTX/pneumomediastinum, requiring  OR 10/03/15 for sternal plating and re-wiring. Last seen 05/13/16. At that visit he had experienced a recent episode of shingles (L chest wall posteriorly) and had noticed recurrence, prompting Valtrex at treatment dose x 1 week, after which he continues on prophylactic dose.    Since that time, major issue has been worsening L sided chest pain. Called 08/17/16 noting aching, worsening, non-radiating L sided pain just to the L of the sternum, without any increase in respiratory-specific symptoms. A CXR was unrevealing. Pain is close to where he had shingles, but location is more anterior, and preceded the shingles episode. Taking frequent APAP and occasional tramadol. We set him up in CT surgery clinic for follow up to evaluate his incision and plate. Otherwise, overall doing well with the exception of a few complaints:    1. Weight gain, up nearly 50# since transplant, 25-30 since last Fall. Just purchased a membership to a health club and hopes to increase exercise. Admits to not doing as much as he should. Mild dyspnea up inclines but he's fairly confident that's only due to weight gain. Feels normal at rest.  2. Tremors, worse over past few weeks    Otherwise, denies cough/sputum, f/c/ns, weight changes, GERD, n/v/abd pain, diarrhea, constipation, sinonasal symptoms, LE edema.    ROS  - Edema has resolved  - Depression much improved  - A complete ROS was otherwise negative or as per hpi     PMH:  SP- BOLT 09/05/15 for COPD (CMV+/+, EBV+/+)   - Explant M. 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  Acetaminophen 325 MG Oral Tab, Take 650 mg by mouth every 6 hours as needed for pain., Disp: , Rfl:   .  Aspirin 81 MG Oral Tab, Take 81 mg by mouth daily., Disp: , Rfl:   .  Calcium Carbonate 500 MG Oral Chew Tab, Chew and swallow 1,000 mg by mouth 2 times a day., Disp: , Rfl:   .  Cholecalciferol 1000 units Oral Tab, Take 1,000 Units by mouth daily., Disp: , Rfl:   .  Docusate Sodium 250 MG Oral Cap, Take 250 mg by mouth 2 times a day., Disp: , Rfl:   .  Fluconazole 200 MG Oral Tab, Take 1 tablet (200 mg) by mouth daily., Disp: 90 tablet, Rfl: 3  .  Furosemide 20 MG Oral Tab, Take 1 tablet (20 mg) by mouth every 48 hours. (Patient not taking: Reported on 05/13/2016), Disp: 15 tablet, Rfl: 2  .  Gabapentin 300 MG Oral Cap, Take 300 mg by mouth 3 times a day., Disp: , Rfl:   .  Magnesium Oxide 400 MG Oral Tab, Take 400 mg by mouth 2 times a day., Disp: , Rfl:   .  Multiple Vitamins-Minerals (MULTIVITAMIN & MINERAL OR), Take 1 tablet by mouth daily., Disp: , Rfl:   .  Mycophenolate Mofetil 500 MG Oral Tab, Take 2 tablets (1,000 mg) by mouth 2 times a day. (Patient taking differently: Take 500 mg by mouth 2 times a day. ), Disp: 120 tablet, Rfl: 11  .  OxyCODONE HCl, Abuse Deter, 5 MG Oral Tablet Abuse-Deterrent, Take 5-10 mg by mouth every 4 hours as needed for pain., Disp: , Rfl:   .  Pantoprazole Sodium 40 MG Oral Tab EC, Take 1 tablet (40 mg) by mouth daily., Disp: 30 tablet, Rfl: 11  .  Polyethylene Glycol 3350 Oral Powder, Take 17 g by mouth daily. Fill cap with powder to the 17 gram mark and dissolve in 4 to 8 ounces of water., Disp: , Rfl:   .  Pravastatin Sodium  (PRAVACHOL) 20 MG Oral Tab, Take 20 mg by mouth every evening., Disp: , Rfl:   .  PredniSONE 10 MG Oral Tab, Take 1 tablet (10 mg) by mouth daily., Disp: 90 tablet, Rfl: 3  .  Sulfamethoxazole-Trimethoprim 400-80 MG Oral Tab, Take 1 tablet by mouth daily., Disp: 30 tablet, Rfl: 11  .  Tacrolimus 1 MG Oral Cap, Take 3 capsules (3 mg) by mouth every 12 hours., Disp: 180 capsule, Rfl: 11  .  ValACYclovir HCl 1 g Oral Tab, 1 tablet three times daily for 7 days then decrease to 1 tablet daily, Disp: 90 tablet, Rfl: 3  .  Venlafaxine HCl 100 MG Oral Tab, Take 100 mg by mouth daily. , Disp: , Rfl:     ALLERGIES:  Codeine - anxiety/dyspnea    SH  Lives in [facility], outside of [place]. Living by himself, and recently sold his home and bought a new one. This has been helpful in moving on after his wife's death. Was working at [employer] but no longer.      PHYSICAL EXAM  Vitals:    08/31/16 0943   BP: 141/85   BP Cuff Size: Large   BP Site: Left Arm   BP Position: Sitting   Pulse: 88   Temp: 98.2 F (36.8 C)   TempSrc: Temporal   SpO2: 97%   Weight: 191 lb 2.2 oz (86.7 kg)   Height: 5\' 9"  (1.753 m)   Body mass index is 28.23 kg/m.  General: comfortable appearing, appears stated age, NAD. He has gained some weight.  HEENT: PERRL, moist mucous membranes, no mucosal lesions  Neck: Without LAD, no thyromegaly  Lungs: Chest is normal to appearance with a well healed incision, no palpable sternal abnormality, symmetric expansion, tympanic to percussion. No wheezes, crackles, or rhonci.  Heart: RRR, Nl S1/S2, without murmurs, gallops, rubs  Abdomen: Nondistended, +BS, soft, nontender, no guarding/rebound, no hepatosplenomegaly  Extremities: WWP, no clubbing or cyanosis, edema has resolved  Skin: no rashes/ lesions. Some discoloration on the L posterior chest in site of prior shingles. No active lesions.   Neuro: Strength normal and symmetric  Psych: Normal affect, conversant    DATA:    Labs   All were reviewed,  most notably -   Cr 0.9 , others stable    CXR was reviewed by me and revealed no acute disease or changes    Spirometry R FVC   %         FEV1      %        2575     %  08/31/16 66  4.07 93 2.68 79 1.47 46   05/13/16 74 3.77 86 2.79 81 2.16 67   02/26/16 70 4.10 93 2.86 84 1.91 59   01/08/2016 72 3.79 86% 2.74 80% 1.97 61%   12/04/2015 73 3.6 82% 2.62 77% 1.91 59%   11/13/2015 79 3.13 71% 2.48 72% 2.13 66%   10/30/2015 84 3.29 76% 2.76 81% 3.02 95%   10/16/2015 89 3.03 70% 2.71 80% 3.3 103%   09/23/2015 82 3.32 77% 2.73 81% 2.72 85%   09/20/2015 85 3.26 75% 2.78 82% 2.76 87%   Interpretation - mild obstruction, 7% decline in FEV1 over past 6 months (remains 95% of baseline), mid-flows are down ~30% since last time (~50% of baseline).    IMPRESSION/PLAN:  66 year old man s/p BOLT 09/05/15 for COPD (CMV+/+, EBV+/+)     1. Lung transplant - Appears well overall, but new hint of obstruction on his spirometry with mid-flows also now down to ~50% of baseline. He has gained ~50 pounds and is having worsening chest wall pain, both of which may be impacting the overall values, but not likely the obstructive pattern.     - CT chest today to evaluate for signs of BOS. Will also provide more info on a structural issue to explain his pain. Plan TBD based on results.   - He will see Dr. Janyce Llanos to evaluate his post-op pain after sternal plating last year. In the meantime increase gabapentin to 600TID while monitoring for signs of poor tolerance (mainly drowsiness).    - Can decrease pred to 5 now that ~1 year out. May help with weight gain. Increase exercise, meet with our RD next time.   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 His statin was upgraded to atorva 20. I recommended continued routine cardiologic  surveillance given his pre-txplant CAD and heightened post-txplant risks   - CA/Vit D for bone health, and will follow up at home with Dr. Laren Everts on osteoporosis   - Received influenza vaccine   - Annual dermatology and opthalmology exams (due for both)    3. Follow up 3 months

## 2016-09-01 ENCOUNTER — Other Ambulatory Visit (HOSPITAL_BASED_OUTPATIENT_CLINIC_OR_DEPARTMENT_OTHER): Payer: Self-pay | Admitting: Thoracic Surgery (Cardiothoracic Vascular Surgery)

## 2016-09-01 ENCOUNTER — Telehealth (HOSPITAL_COMMUNITY): Payer: Self-pay

## 2016-09-01 ENCOUNTER — Ambulatory Visit
Admission: RE | Admit: 2016-09-01 | Discharge: 2016-09-01 | Disposition: A | Discharge status: Home / Self Care | Payer: Medicare Other | Attending: Thoracic Surgery (Cardiothoracic Vascular Surgery) | Admitting: Thoracic Surgery (Cardiothoracic Vascular Surgery)

## 2016-09-01 ENCOUNTER — Ambulatory Visit (HOSPITAL_BASED_OUTPATIENT_CLINIC_OR_DEPARTMENT_OTHER): Payer: Medicare Other | Admitting: Thoracic Surgery (Cardiothoracic Vascular Surgery)

## 2016-09-01 ENCOUNTER — Other Ambulatory Visit: Payer: Self-pay | Admitting: Student in an Organized Health Care Education/Training Program

## 2016-09-01 DIAGNOSIS — Z942 Lung transplant status: Secondary | ICD-10-CM

## 2016-09-01 DIAGNOSIS — T86818 Other complications of lung transplant: Secondary | ICD-10-CM | POA: Insufficient documentation

## 2016-09-01 DIAGNOSIS — K219 Gastro-esophageal reflux disease without esophagitis: Secondary | ICD-10-CM | POA: Insufficient documentation

## 2016-09-01 DIAGNOSIS — Z7982 Long term (current) use of aspirin: Secondary | ICD-10-CM | POA: Insufficient documentation

## 2016-09-01 DIAGNOSIS — T8484XA Pain due to internal orthopedic prosthetic devices, implants and grafts, initial encounter: Secondary | ICD-10-CM

## 2016-09-01 DIAGNOSIS — Z7952 Long term (current) use of systemic steroids: Secondary | ICD-10-CM | POA: Insufficient documentation

## 2016-09-01 DIAGNOSIS — Z87891 Personal history of nicotine dependence: Secondary | ICD-10-CM | POA: Insufficient documentation

## 2016-09-01 DIAGNOSIS — R918 Other nonspecific abnormal finding of lung field: Secondary | ICD-10-CM

## 2016-09-01 DIAGNOSIS — I2510 Atherosclerotic heart disease of native coronary artery without angina pectoris: Secondary | ICD-10-CM | POA: Insufficient documentation

## 2016-09-01 DIAGNOSIS — E785 Hyperlipidemia, unspecified: Secondary | ICD-10-CM | POA: Insufficient documentation

## 2016-09-01 DIAGNOSIS — R0602 Shortness of breath: Secondary | ICD-10-CM

## 2016-09-01 LAB — EBV QUANT BY PCR: EBV Quant Result: NOT DETECTED [IU]/mL

## 2016-09-01 LAB — GLUCOSE POC, ~~LOC~~: Glucose (POC): 105 mg/dL (ref 62–125)

## 2016-09-01 NOTE — Telephone Encounter (Signed)
Pre Procedure call made to inform Dakota Marsh that his Bronchoscopy and TBBX is scheduled for 1pm tomorrow in the Bronchoscopy unit. I spoke with Delfino Lovett his brother and asked him to ensure [place] fasts for 6 hours pre procedure tomorrow. He confirmed Francesco is being admitted to an inpatient bed overnight and I agreed to speak with the nurses in [facility] to arrange his transfer to Bronchoscopy tomorrow.

## 2016-09-02 ENCOUNTER — Ambulatory Visit (HOSPITAL_COMMUNITY): Payer: Medicare Other | Admitting: Pulmonary Disease

## 2016-09-02 ENCOUNTER — Encounter (HOSPITAL_BASED_OUTPATIENT_CLINIC_OR_DEPARTMENT_OTHER): Payer: Medicare Other

## 2016-09-02 ENCOUNTER — Telehealth (HOSPITAL_COMMUNITY): Payer: Self-pay

## 2016-09-02 NOTE — Telephone Encounter (Signed)
I called Dakota Marsh at home as I discovered he had not been admitted to 4[facility] overnight as had been discussed with his brother Delfino Lovett yesterday. He confirmed he had been discharged after his surgery procedure. I asked if he would be attending for his scheduled Bronchoscopy today and he said he thought he was finished with tests. I told him I had a request from Dr Waymond Cera for the Bronchoscopy with Biopsies today as it had not been possible to schedule a flexible Bronchoscopy as part of his OR visit yesterday. He did not fully understand why he needed another test so I asked if he wished to speak with Reggie Pope the post lung transplant nurse and he said yes. I agreed to talk with her after this call and get her to call him. I asked if he had eaten today and he confirmed he had eaten breakfast. I reassured him we would resolve the situation for him and to await a call from [place].

## 2016-09-02 NOTE — Telephone Encounter (Signed)
Follow up calls x 2 made to coordinate a Bronchoscopy with Biopsies procedure tomorrow Jan 25th as Dakota Marsh lives 2 hours drive away and is reliant on his brother who has a business driving him here. We agreed he would check in at 11.30 am in the [facility] for a 12.00 midday procedure. He knows to fast for 6 hours pre procedure and has agreed to come as this is important for his treatment. I also spoke with his brother Dakota Marsh who had more questions relating to why [name] has to come again for a second scope procedure. I explained the need for biopsies and apologized for the inconvenience of 2 trips to the hospital.

## 2016-09-03 ENCOUNTER — Other Ambulatory Visit (HOSPITAL_BASED_OUTPATIENT_CLINIC_OR_DEPARTMENT_OTHER): Payer: Self-pay

## 2016-09-03 ENCOUNTER — Other Ambulatory Visit (HOSPITAL_COMMUNITY): Payer: Medicare Other | Admitting: Pulmonary Disease

## 2016-09-03 ENCOUNTER — Ambulatory Visit: Payer: Medicare Other | Attending: Pulmonary Disease

## 2016-09-03 ENCOUNTER — Other Ambulatory Visit (HOSPITAL_BASED_OUTPATIENT_CLINIC_OR_DEPARTMENT_OTHER): Payer: Self-pay | Admitting: Pulmonary Disease

## 2016-09-03 DIAGNOSIS — Z942 Lung transplant status: Secondary | ICD-10-CM

## 2016-09-03 DIAGNOSIS — Z4824 Encounter for aftercare following lung transplant: Secondary | ICD-10-CM

## 2016-09-03 DIAGNOSIS — J9811 Atelectasis: Secondary | ICD-10-CM | POA: Insufficient documentation

## 2016-09-03 DIAGNOSIS — R918 Other nonspecific abnormal finding of lung field: Secondary | ICD-10-CM

## 2016-09-03 DIAGNOSIS — Z79899 Other long term (current) drug therapy: Secondary | ICD-10-CM | POA: Insufficient documentation

## 2016-09-03 LAB — BAL CELL CT AND DIFF
% Basophils, BAL: 0 %
% Eosinophils, BAL: 0 % (ref 0–1)
% Epithelial Cells, BAL: 5 % (ref 0–5)
% Lymphocytes, BAL: 3 % (ref 0–15)
% Macro/Meso cells, BAL: 92 % (ref 80–100)
% Neutrophils, BAL: 0 % (ref 0–3)
% Other, BAL: 0 %
BAL  NUCLEATED CELLS: 126 {cells}/uL
BAL RBC: <3000 {cells}/uL
BAL Volume: 31 mL
No. BAL Cells Counted for Diff: 100 {cells}

## 2016-09-03 LAB — PATHOLOGY, SURGICAL

## 2016-09-03 LAB — PROTHROMBIN & PTT
Partial Thromboplastin Time: 30 s (ref 22–35)
Prothrombin INR: 1.1 (ref 0.8–1.3)
Prothrombin Time Patient: 13.7 s (ref 10.7–15.6)

## 2016-09-03 LAB — HLA TESTING (SENDOUT)

## 2016-09-04 LAB — RESPIRATORY VIRUS PANEL, SEMI-QUANT PCR
Adenovirus PCR Qual Result: NOT DETECTED
Bocavirus PCR Qual Result: NOT DETECTED
Coronavirus PCR Qual Result: NOT DETECTED
Influenza A PCR Qual Result: NOT DETECTED
Influenza B PCR Qual Result: NOT DETECTED
Metapneumovirus PCR Qual Rslt: NOT DETECTED
Parainfluenza 1 PCR Qual Rslt: NOT DETECTED
Parainfluenza 2 PCR Qual Rslt: NOT DETECTED
Parainfluenza 3 PCR Qual Rslt: NOT DETECTED
Parainfluenza 4 PCR Qual Rslt: NOT DETECTED
Rhinovirus PCR Qual Result: NOT DETECTED
Rsv PCR Qual Result: NOT DETECTED

## 2016-09-04 LAB — PATHOLOGY, SURGICAL

## 2016-09-04 LAB — ASPERGILLUS GM EIA, BAL FLD: Aspergillus GM Index, BAL: 0.13 (ref 0.000–0.499)

## 2016-09-06 LAB — LOWER RESP C/S W/GRAM: Gram Smear: NONE SEEN

## 2016-09-08 ENCOUNTER — Encounter (HOSPITAL_BASED_OUTPATIENT_CLINIC_OR_DEPARTMENT_OTHER): Payer: Self-pay

## 2016-09-08 ENCOUNTER — Other Ambulatory Visit (HOSPITAL_COMMUNITY): Payer: Self-pay

## 2016-09-08 LAB — CMV_PCR QUANT, EXTERNAL: CMV Quant Result, External: 1120

## 2016-09-08 LAB — TACROLIMUS, EXTERNAL: Tacrolimus, External: 9

## 2016-09-08 LAB — BASIC METABOLIC PANEL, EXTERNAL
Calcium, External: 9.2
Carbon Dioxide (Total), External: 26
Chloride, External: 106
Creatinine, External: 0.91
Glucose, External: 104
Potassium, External: 4.4
Sodium, External: 143
Urea Nitrogen, External: 21

## 2016-09-08 LAB — FUNGAL DETECTION BY PCR

## 2016-09-08 LAB — ASPERGILLUS FUMIGATUS PCR: Culture: NOT DETECTED

## 2016-09-09 ENCOUNTER — Encounter (HOSPITAL_BASED_OUTPATIENT_CLINIC_OR_DEPARTMENT_OTHER): Payer: Self-pay | Admitting: Internal Medicine

## 2016-09-09 ENCOUNTER — Telehealth (HOSPITAL_BASED_OUTPATIENT_CLINIC_OR_DEPARTMENT_OTHER): Payer: Self-pay | Admitting: Cardiac Surgery

## 2016-09-09 ENCOUNTER — Encounter (HOSPITAL_BASED_OUTPATIENT_CLINIC_OR_DEPARTMENT_OTHER): Payer: Self-pay

## 2016-09-09 LAB — HLA AB SPEC. MONITOR DSA (SENDOUT)

## 2016-09-09 NOTE — Telephone Encounter (Signed)
Received a phone call from [place] in Lung Transplant regarding Dakota Marsh to let the thoracic team know that he was being seen in a local ED to evaluate his wound.    When I call and spoke with Dakota Marsh, he was currently in the hospital and had not yet seen anyone. He described that when he was patting his chest dry after showering that he "accidentally took off about 2 inches of glue". When I asked if her was referring to steri strips, he again said he removed glue. I then asked if the incision had separated, he replied that he was unable to see the site. He reported that he had a copious amount of drainage, though he did not note the color. I spoke with a nurse at the hospital so that they could contact me if they had any questions.     When I called back to provide my pager number, Dakota Marsh reported that the physician had expressed fluid from what sounds to be a seroma and covered it with gauze. He indicated that he was already on antibiotics and that he would continue that prescription. I provided him with the number to the office if he had any further questions regarding his wound healing. He expressed understanding an had no further questions.     Benay Spice, PA-C

## 2016-09-10 ENCOUNTER — Other Ambulatory Visit (HOSPITAL_COMMUNITY): Payer: Self-pay

## 2016-09-10 ENCOUNTER — Encounter (HOSPITAL_BASED_OUTPATIENT_CLINIC_OR_DEPARTMENT_OTHER): Payer: Self-pay

## 2016-09-10 ENCOUNTER — Other Ambulatory Visit (HOSPITAL_BASED_OUTPATIENT_CLINIC_OR_DEPARTMENT_OTHER): Payer: Self-pay

## 2016-09-10 DIAGNOSIS — Z942 Lung transplant status: Secondary | ICD-10-CM

## 2016-09-10 LAB — R/O LEGIONELLA C/S

## 2016-09-10 MED ORDER — SIROLIMUS 1 MG OR TABS
1.0000 mg | ORAL_TABLET | Freq: Every day | ORAL | 11 refills | Status: DC
Start: 2016-09-10 — End: 2017-02-17

## 2016-09-10 MED ORDER — VALGANCICLOVIR HCL 450 MG OR TABS
900.0000 mg | ORAL_TABLET | Freq: Two times a day (BID) | ORAL | 3 refills | Status: DC
Start: 2016-09-10 — End: 2016-10-28

## 2016-09-11 ENCOUNTER — Other Ambulatory Visit (HOSPITAL_COMMUNITY): Payer: Self-pay

## 2016-09-13 ENCOUNTER — Telehealth (HOSPITAL_BASED_OUTPATIENT_CLINIC_OR_DEPARTMENT_OTHER): Payer: Self-pay | Admitting: Internal Medicine

## 2016-09-13 DIAGNOSIS — R112 Nausea with vomiting, unspecified: Secondary | ICD-10-CM

## 2016-09-13 MED ORDER — ONDANSETRON 8 MG OR TBDP
8.0000 mg | ORAL_TABLET | Freq: Three times a day (TID) | ORAL | 1 refills | Status: DC | PRN
Start: 2016-09-13 — End: 2017-02-17

## 2016-09-13 NOTE — Telephone Encounter (Signed)
Dakota Marsh is having a lot of nausea with th initiation of his valganciclovir and sirolimus.  He is unable to keep down any food or pills.  I am sending him Several days of Zofran ODT and have asked him to call us tomorrow if he is unable to keep down his medications despite Zofran.  Hopefully after several days of tolerating medications he will be able to wean off the zofran.    [name]

## 2016-09-16 ENCOUNTER — Other Ambulatory Visit: Payer: Self-pay

## 2016-09-17 ENCOUNTER — Other Ambulatory Visit (HOSPITAL_BASED_OUTPATIENT_CLINIC_OR_DEPARTMENT_OTHER): Payer: Self-pay

## 2016-09-17 ENCOUNTER — Other Ambulatory Visit (HOSPITAL_COMMUNITY): Payer: Self-pay

## 2016-09-17 DIAGNOSIS — Z942 Lung transplant status: Secondary | ICD-10-CM

## 2016-09-17 MED ORDER — AZITHROMYCIN 250 MG OR TABS
ORAL_TABLET | ORAL | 11 refills | Status: DC
Start: 2016-09-17 — End: 2018-06-29

## 2016-09-18 ENCOUNTER — Encounter (HOSPITAL_BASED_OUTPATIENT_CLINIC_OR_DEPARTMENT_OTHER): Payer: Self-pay

## 2016-09-18 ENCOUNTER — Other Ambulatory Visit (HOSPITAL_COMMUNITY): Payer: Self-pay

## 2016-09-18 LAB — BASIC METABOLIC PANEL, EXTERNAL
Calcium, External: 8.8
Carbon Dioxide (Total), External: 26
Chloride, External: 107
Creatinine, External: 0.96
Glucose, External: 97
Potassium, External: 4.1
Sodium, External: 145
Urea Nitrogen, External: 22

## 2016-09-18 LAB — CBC (HEMOGRAM), EXTERNAL
Hematocrit, External: 34.5
Platelet Count, External: 315
WBC, External: 5.16

## 2016-09-18 LAB — SIROLIMUS, EXTERNAL: Sirolimus, External: 8.1

## 2016-09-18 LAB — CMV_PCR QUANT, EXTERNAL: CMV Quant Result, External: NOT DETECTED

## 2016-09-18 LAB — CBC, DIFF, EXTERNAL: Neutrophils, External: 2.34

## 2016-09-21 ENCOUNTER — Encounter (HOSPITAL_BASED_OUTPATIENT_CLINIC_OR_DEPARTMENT_OTHER): Payer: Self-pay

## 2016-09-24 ENCOUNTER — Other Ambulatory Visit (HOSPITAL_COMMUNITY): Payer: Self-pay

## 2016-09-24 LAB — CULTURE:HERPES & CMV RAPD DET
CMV Rapid Detection Day 1: NOT DETECTED
CMV Rapid Detection Day 2: NOT DETECTED

## 2016-09-24 LAB — CMV_PCR QUANT, EXTERNAL: CMV Quant Result, External: NOT DETECTED

## 2016-09-30 ENCOUNTER — Encounter (HOSPITAL_BASED_OUTPATIENT_CLINIC_OR_DEPARTMENT_OTHER): Payer: Medicare Other

## 2016-10-01 LAB — LOWER RESP FUNGAL W/DIR. EXAM: Stain For Fungus: NONE SEEN

## 2016-10-02 ENCOUNTER — Other Ambulatory Visit (HOSPITAL_COMMUNITY): Payer: Self-pay

## 2016-10-02 LAB — PULMONARY FUNCTION TESTING
FEF25-75-%Pred-Pre: 64
FEF25-75-Pre: 1.67
FEV1-%Pred-Pre: 83
FEV1-Pre: 2.71
FEV1/FVC-Pre: 70
FVC-%Pred-Pre: 88
FVC-Pre: 3.89

## 2016-10-05 ENCOUNTER — Encounter (HOSPITAL_BASED_OUTPATIENT_CLINIC_OR_DEPARTMENT_OTHER): Payer: Self-pay | Admitting: Transplant

## 2016-10-05 ENCOUNTER — Other Ambulatory Visit (HOSPITAL_BASED_OUTPATIENT_CLINIC_OR_DEPARTMENT_OTHER): Payer: Self-pay

## 2016-10-05 ENCOUNTER — Other Ambulatory Visit (HOSPITAL_COMMUNITY): Payer: Self-pay

## 2016-10-05 ENCOUNTER — Other Ambulatory Visit (HOSPITAL_BASED_OUTPATIENT_CLINIC_OR_DEPARTMENT_OTHER): Payer: Self-pay | Admitting: Transplant

## 2016-10-05 ENCOUNTER — Ambulatory Visit (HOSPITAL_BASED_OUTPATIENT_CLINIC_OR_DEPARTMENT_OTHER): Payer: Self-pay

## 2016-10-05 DIAGNOSIS — D849 Immunodeficiency, unspecified: Secondary | ICD-10-CM

## 2016-10-05 DIAGNOSIS — Z48298 Encounter for aftercare following other organ transplant: Secondary | ICD-10-CM

## 2016-10-05 DIAGNOSIS — Z942 Lung transplant status: Secondary | ICD-10-CM

## 2016-10-05 LAB — BASIC METABOLIC PANEL, EXTERNAL
Calcium, External: 9.3
Carbon Dioxide (Total), External: 26
Chloride, External: 105
Creatinine, External: 1.2
Glucose, External: 109
Potassium, External: 3.9
Sodium, External: 149
Urea Nitrogen, External: 17

## 2016-10-05 LAB — CBC (HEMOGRAM), EXTERNAL
Hematocrit, External: 35.8
Platelet Count, External: 262
WBC, External: 6.8

## 2016-10-05 LAB — SIROLIMUS, EXTERNAL: Sirolimus, External: <2

## 2016-10-05 LAB — CBC, DIFF, EXTERNAL: Neutrophils, External: 4.02

## 2016-10-05 LAB — TACROLIMUS, EXTERNAL: Tacrolimus, External: 5.2

## 2016-10-05 LAB — CMV_PCR QUANT, EXTERNAL: CMV Quant Result, External: NOT DETECTED

## 2016-10-05 MED ORDER — TACROLIMUS 1 MG OR CAPS
1.0000 mg | ORAL_CAPSULE | Freq: Two times a day (BID) | ORAL | 11 refills | Status: DC
Start: 2016-10-05 — End: 2016-10-28

## 2016-10-05 NOTE — Progress Notes (Signed)
Medication refill request completed.  

## 2016-10-06 ENCOUNTER — Encounter (HOSPITAL_BASED_OUTPATIENT_CLINIC_OR_DEPARTMENT_OTHER): Payer: Self-pay

## 2016-10-06 ENCOUNTER — Encounter (HOSPITAL_BASED_OUTPATIENT_CLINIC_OR_DEPARTMENT_OTHER): Payer: Self-pay | Admitting: Transplant

## 2016-10-07 ENCOUNTER — Encounter (HOSPITAL_BASED_OUTPATIENT_CLINIC_OR_DEPARTMENT_OTHER): Payer: Self-pay

## 2016-10-08 ENCOUNTER — Other Ambulatory Visit (HOSPITAL_COMMUNITY): Payer: Self-pay

## 2016-10-08 ENCOUNTER — Encounter (HOSPITAL_COMMUNITY): Payer: Self-pay

## 2016-10-08 NOTE — Progress Notes (Signed)
Immunosuppressant dose change per [facility] transplant clinic note

## 2016-10-13 ENCOUNTER — Other Ambulatory Visit (HOSPITAL_COMMUNITY): Payer: Self-pay

## 2016-10-13 ENCOUNTER — Encounter (HOSPITAL_BASED_OUTPATIENT_CLINIC_OR_DEPARTMENT_OTHER): Payer: Self-pay

## 2016-10-13 LAB — BASIC METABOLIC PANEL, EXTERNAL
Calcium, External: 9.3
Carbon Dioxide (Total), External: 24
Chloride, External: 107
Creatinine, External: 1
Glucose, External: 101
Potassium, External: 4.1
Sodium, External: 149
Urea Nitrogen, External: 21

## 2016-10-13 LAB — SIROLIMUS, EXTERNAL: Sirolimus, External: 5

## 2016-10-13 LAB — CMV_PCR QUANT, EXTERNAL: CMV Quant Result, External: NOT DETECTED

## 2016-10-13 LAB — TACROLIMUS, EXTERNAL: Tacrolimus, External: 15.2

## 2016-10-14 ENCOUNTER — Encounter (HOSPITAL_BASED_OUTPATIENT_CLINIC_OR_DEPARTMENT_OTHER): Payer: Self-pay

## 2016-10-15 ENCOUNTER — Encounter (HOSPITAL_BASED_OUTPATIENT_CLINIC_OR_DEPARTMENT_OTHER): Payer: Self-pay

## 2016-10-16 ENCOUNTER — Encounter (HOSPITAL_BASED_OUTPATIENT_CLINIC_OR_DEPARTMENT_OTHER): Payer: Self-pay | Admitting: Internal Medicine

## 2016-10-16 ENCOUNTER — Other Ambulatory Visit (HOSPITAL_BASED_OUTPATIENT_CLINIC_OR_DEPARTMENT_OTHER): Payer: Self-pay | Admitting: Internal Medicine

## 2016-10-16 DIAGNOSIS — Z942 Lung transplant status: Secondary | ICD-10-CM

## 2016-10-16 MED ORDER — TACROLIMUS 0.5 MG OR CAPS
0.5000 mg | ORAL_CAPSULE | Freq: Every morning | ORAL | 11 refills | Status: DC
Start: 2016-10-16 — End: 2016-10-28

## 2016-10-21 ENCOUNTER — Other Ambulatory Visit (HOSPITAL_COMMUNITY): Payer: Self-pay

## 2016-10-21 ENCOUNTER — Other Ambulatory Visit (HOSPITAL_BASED_OUTPATIENT_CLINIC_OR_DEPARTMENT_OTHER): Payer: Self-pay | Admitting: Internal Medicine

## 2016-10-21 DIAGNOSIS — Z48298 Encounter for aftercare following other organ transplant: Secondary | ICD-10-CM

## 2016-10-21 DIAGNOSIS — Z942 Lung transplant status: Secondary | ICD-10-CM

## 2016-10-21 LAB — TACROLIMUS, EXTERNAL: Tacrolimus, External: 26

## 2016-10-21 LAB — BASIC METABOLIC PANEL, EXTERNAL
Calcium, External: 9.7
Carbon Dioxide (Total), External: 23
Chloride, External: 103
Creatinine, External: 1.9
Glucose, External: 111
Potassium, External: 4.7
Sodium, External: 145
Urea Nitrogen, External: 39

## 2016-10-21 LAB — CMV_PCR QUANT, EXTERNAL: CMV Quant Result, External: NOT DETECTED

## 2016-10-22 ENCOUNTER — Encounter (HOSPITAL_BASED_OUTPATIENT_CLINIC_OR_DEPARTMENT_OTHER): Payer: Self-pay

## 2016-10-22 NOTE — Addendum Note (Signed)
Addended by: Janann Colonel on: 10/22/2016 01:34 PM     Modules accepted: Miquel Dunn

## 2016-10-23 ENCOUNTER — Other Ambulatory Visit (HOSPITAL_BASED_OUTPATIENT_CLINIC_OR_DEPARTMENT_OTHER): Payer: Self-pay

## 2016-10-23 ENCOUNTER — Encounter (HOSPITAL_BASED_OUTPATIENT_CLINIC_OR_DEPARTMENT_OTHER): Payer: Self-pay

## 2016-10-23 DIAGNOSIS — Z942 Lung transplant status: Secondary | ICD-10-CM

## 2016-10-24 ENCOUNTER — Other Ambulatory Visit (HOSPITAL_COMMUNITY): Payer: Self-pay

## 2016-10-28 ENCOUNTER — Other Ambulatory Visit (HOSPITAL_COMMUNITY): Payer: Self-pay

## 2016-10-28 ENCOUNTER — Ambulatory Visit (HOSPITAL_BASED_OUTPATIENT_CLINIC_OR_DEPARTMENT_OTHER): Payer: Medicare Other | Admitting: Registered"

## 2016-10-28 ENCOUNTER — Ambulatory Visit: Payer: Medicare Other | Attending: Transplant | Admitting: Transplant

## 2016-10-28 ENCOUNTER — Other Ambulatory Visit (HOSPITAL_BASED_OUTPATIENT_CLINIC_OR_DEPARTMENT_OTHER): Payer: Self-pay | Admitting: Transplant

## 2016-10-28 ENCOUNTER — Ambulatory Visit (HOSPITAL_BASED_OUTPATIENT_CLINIC_OR_DEPARTMENT_OTHER): Payer: Medicare Other

## 2016-10-28 ENCOUNTER — Other Ambulatory Visit (HOSPITAL_BASED_OUTPATIENT_CLINIC_OR_DEPARTMENT_OTHER): Payer: Self-pay

## 2016-10-28 ENCOUNTER — Other Ambulatory Visit (HOSPITAL_BASED_OUTPATIENT_CLINIC_OR_DEPARTMENT_OTHER): Payer: Self-pay | Admitting: Internal Medicine

## 2016-10-28 VITALS — Ht 68.62 in | Wt 178.4 lb

## 2016-10-28 VITALS — BP 151/92 | HR 97 | Temp 98.2°F | Ht 69.0 in | Wt 179.5 lb

## 2016-10-28 DIAGNOSIS — Z942 Lung transplant status: Secondary | ICD-10-CM

## 2016-10-28 DIAGNOSIS — Z48298 Encounter for aftercare following other organ transplant: Secondary | ICD-10-CM | POA: Insufficient documentation

## 2016-10-28 DIAGNOSIS — R942 Abnormal results of pulmonary function studies: Secondary | ICD-10-CM

## 2016-10-28 DIAGNOSIS — R918 Other nonspecific abnormal finding of lung field: Secondary | ICD-10-CM

## 2016-10-28 DIAGNOSIS — Z4824 Encounter for aftercare following lung transplant: Secondary | ICD-10-CM

## 2016-10-28 DIAGNOSIS — D899 Disorder involving the immune mechanism, unspecified: Secondary | ICD-10-CM | POA: Insufficient documentation

## 2016-10-28 DIAGNOSIS — R7989 Other specified abnormal findings of blood chemistry: Secondary | ICD-10-CM | POA: Insufficient documentation

## 2016-10-28 DIAGNOSIS — D849 Immunodeficiency, unspecified: Secondary | ICD-10-CM

## 2016-10-28 DIAGNOSIS — Z6826 Body mass index (BMI) 26.0-26.9, adult: Secondary | ICD-10-CM

## 2016-10-28 DIAGNOSIS — I159 Secondary hypertension, unspecified: Secondary | ICD-10-CM

## 2016-10-28 LAB — PULMONARY FUNCTION TESTING
FEF25-75-%Pred-Pre: 55
FEF25-75-Pre: 1.73
FEV1-%Pred-Pre: 86
FEV1-Pre: 2.92
FEV1/FVC-Pre: 68
FVC-%Pred-Pre: 98
FVC-Pre: 4.29

## 2016-10-28 LAB — LUNG TRX, POST OP EXTENDED PANEL
% Basophils: 0 %
% Eosinophils: 1 %
% Immature Granulocytes: 0 %
% Lymphocytes: 27 %
% Monocytes: 11 %
% Neutrophils: 61 %
% Nucleated RBC: 0 %
ALT (GPT): 28 U/L (ref 10–48)
AST (GOT): 31 U/L (ref 9–38)
Absolute Eosinophil Count: 0.08 10*3/uL (ref 0.00–0.50)
Absolute Lymphocyte Count: 2.4 10*3/uL (ref 1.00–4.80)
Albumin: 3.9 g/dL (ref 3.5–5.2)
Alkaline Phosphatase (Total): 107 U/L (ref 36–161)
Anion Gap: 11 (ref 4–12)
Basophils: 0.02 10*3/uL (ref 0.00–0.20)
Bilirubin (Total): 0.3 mg/dL (ref 0.2–1.3)
CMV Quant Result: NOT DETECTED [IU]/mL
Calcium: 9.2 mg/dL (ref 8.9–10.2)
Carbon Dioxide, Total: 23 meq/L (ref 22–32)
Chloride: 105 meq/L (ref 98–108)
Creatinine: 1.54 mg/dL — ABNORMAL HIGH (ref 0.51–1.18)
GFR, Calc, African American: 55 mL/min/{1.73_m2} — ABNORMAL LOW (ref >59)
GFR, Calc, European American: 45 mL/min/{1.73_m2} — ABNORMAL LOW (ref >59)
Glucose: 135 mg/dL — ABNORMAL HIGH (ref 62–125)
Hematocrit: 36 % — ABNORMAL LOW (ref 38–50)
Hemoglobin: 11.1 g/dL — ABNORMAL LOW (ref 13.0–18.0)
Immature Granulocytes: 0.03 10*3/uL (ref 0.00–0.05)
MCH: 27.1 pg — ABNORMAL LOW (ref 27.3–33.6)
MCHC: 31.2 g/dL — ABNORMAL LOW (ref 32.2–36.5)
MCV: 87 fL (ref 81–98)
Magnesium: 1.8 mg/dL (ref 1.8–2.4)
Monocytes: 0.92 10*3/uL — ABNORMAL HIGH (ref 0.00–0.80)
Neutrophils: 5.34 10*3/uL (ref 1.80–7.00)
Nucleated RBC: 0 10*3/uL
Phosphate: 2.7 mg/dL (ref 2.5–4.5)
Platelet Count: 240 10*3/uL (ref 150–400)
Potassium: 3.8 meq/L (ref 3.6–5.2)
Protein (Total): 7.5 g/dL (ref 6.0–8.2)
RBC: 4.1 10*6/uL — ABNORMAL LOW (ref 4.40–5.60)
RDW-CV: 13.8 % (ref 11.6–14.4)
Sodium: 139 meq/L (ref 135–145)
Urea Nitrogen: 25 mg/dL — ABNORMAL HIGH (ref 8–21)
WBC: 8.79 10*3/uL (ref 4.3–10.0)

## 2016-10-28 LAB — SIROLIMUS BY LCMSMS: Sirolimus: 8.8 ng/mL

## 2016-10-28 LAB — PROTHROMBIN & PTT
Partial Thromboplastin Time: 77 s — ABNORMAL HIGH (ref 22–35)
Prothrombin INR: 7.9 (ref 0.8–1.3)
Prothrombin Time Patient: 67.3 s — ABNORMAL HIGH (ref 10.7–15.6)

## 2016-10-28 LAB — TACROLIMUS BY LCMSMS: Tacrolimus By LCMSMS: 12.2 ng/mL

## 2016-10-28 NOTE — Progress Notes (Addendum)
[facility] POST-TRANSPLANT VISIT    ID/CC:  Mr. 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 He presented shortly thereafter with a sternal dehiscence complicated by PTX/pneumomediastinum, requiring OR 10/03/15 for sternal plating and re-wiring. He then experienced an episode of shingles  In October 2017 (L chest wall posteriorly) which was treated with valacyclovir.    He was last seen in post transplant clinic on August 31, 2016.  At that time he was complaining of mild chest discomfort and subsequently underwent removal of sternal hardware with Dr. Janyce Llanos on 09/01/16.  Due to a mild decline in his mid flows on spirometry, he was scheduled for a bronchoscopy on 09/03/16 and started on azithromycin for BOS prophylaxis.  His bronchoscopy did not reveal any airway inflammation (B0), but unfortunately there were no alveolar samples to grade 4 cellular rejection.    Since then, he has been doing quite well from a pulmonary standpoint.  He denies any dyspnea, cough, sputum production, fevers, chills, or weight changes.  He states that he is able to be active and complete his daily tasks without difficulty.  He recently experienced an upper respiratory tract infection, but his symptoms have completely resolved since then.    He does state that he recently was diagnosed with a right lower extremity DVT.  He states that he had been trying to work a trailer up to his vehicle when he sprained his ankle.  As a result, he remained immobile for several days while he recovered.  Subsequently, he developed right lower extremity swelling and pain prompting an ER visit.  He reports that he was diagnosed with a DVT and started on warfarin.  He does not  report being bridged with low molecular weight heparin.  Unfortunately, he does not know the dose of his warfarin, and states that he has not had any lab draws.  Since starting the warfarin, he has had nightly epistaxis that has been soaking his pillow.  He has not had any other bleeding.  He states he continues to have mild right lower extremity discomfort and swelling, but he reports that these are improving slowly.    In terms of his health maintenance, he is working on arranging a dermatologic examination and eye examinations locally.    When reviewing his medications, he does report some confusion about what he should be on.  He states that he has been using the medication list he was given back when he originally discharged from the hospital with multiple addendum's.  He is hoping he can have an updated medication list to clear up his confusion.  He does confirm with me that he has been taking his immunosuppressive medications, although he reports taking 3 mg of tacrolimus in the morning and 4 mg in the evening. In MAX, we have him recorded as taking 3 mg every 12 hours.    ROS  Complete review of systems was performed and is negative except as mentioned in above history.    PMH:  SP- BOLT 09/05/15 for COPD (CMV+/+, EBV+/+)   - Explant M. 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  Acetaminophen 325 MG Oral Tab, Take 650 mg by mouth every 6 hours as needed for pain., Disp: , Rfl:   .   Aspirin 81 MG Oral Tab, Take 81 mg by mouth daily., Disp: , Rfl:   .  Atorvastatin Calcium 20 MG Oral Tab, Take 20 mg by mouth., Disp: , Rfl:   .  Azithromycin 250 MG Oral Tab, Take one tablet daily for five days, then three times a week on Monday , Wednesday and Friday., Disp: 15 tablet, Rfl: 11  .  Cholecalciferol 1000 units Oral Tab, Take 1,000 Units by mouth daily., Disp: , Rfl:   .  Fluconazole 200 MG Oral Tab, Take 1 tablet (200 mg) by mouth daily., Disp: 90 tablet, Rfl: 3  .  Gabapentin 300 MG Oral Cap, Take 300 mg by mouth 3 times a day., Disp: , Rfl:   .  Magnesium Oxide 400 MG Oral Tab, Take 400 mg by mouth 2 times a day., Disp: , Rfl:   .  Multiple Vitamins-Minerals (MULTIVITAMIN & MINERAL OR), Take 1 tablet by mouth daily., Disp: , Rfl:   .  Ondansetron 8 MG Oral TABLET DISPERSIBLE, Dissolve 1 tablet (8 mg) on top of tongue and swallow every 8 hours as needed for nausea/vomiting., Disp: 12 tablet, Rfl: 1  .  Pantoprazole Sodium 40 MG Oral Tab EC, Take 1 tablet (40 mg) by mouth daily., Disp: 30 tablet, Rfl: 11  .  Polyethylene Glycol 3350 Oral Powder, Take 17 g by mouth daily. Fill cap with powder to the 17 gram mark and dissolve in 4 to 8 ounces of water., Disp: , Rfl:   .  PredniSONE 10 MG Oral Tab, Take 0.5 tablets (5 mg) by mouth daily., Disp: 90 tablet, Rfl: 3  .  Sirolimus 1 MG Oral Tab, Take 1 tablet (1 mg) by mouth daily. Or as directed by provider., Disp: 30 tablet, Rfl: 11  .  Sulfamethoxazole-Trimethoprim 400-80 MG Oral Tab, Take 1 tablet by mouth daily., Disp: 30 tablet, Rfl: 11  .  Tacrolimus 1 MG Oral Cap, Take 1 capsule (1 mg) by mouth every 12 hours. Take 3 mg every 12 hours, Disp: 210 capsule, Rfl: 11  .  ValACYclovir HCl 1 g Oral Tab, Take 1 g by mouth daily., Disp: , Rfl:   .  Venlafaxine HCl 100 MG Oral Tab, Take 100 mg by mouth daily. , Disp: , Rfl:     ALLERGIES:  Codeine - anxiety/dyspnea    SH  Lives in [facility], outside of [place]. Living by himself, and recently sold  his home and bought a new one. This has been helpful in moving on after his wife's death. Was working at [employer] but no longer.    PHYSICAL EXAM  BP (!) 151/92   Pulse 97   Temp 98.2 F (36.8 C) (Temporal)   Ht 5\' 9"  (1.753 m)   Wt 179 lb 8 oz (81.4 kg)   SpO2 100% Comment: RA  BMI 26.51 kg/m   Gen: Pleasant, well appearing, in NAD. Does have some  spots of blood on his shirt  HEENT: sclera anicteric, No epistaxis  Neck: supple   Pulm: breathing comfortably, lungs ctab; no wheezing/rhonchi   CV: rrr; no m/r/g, RLE edema noted  Abdomen: soft; nt/nd; bs+   Skin: no rash   MSK: Asymmetric RLE edema  Neuro: alert and oriented x3    DATA:    Labs   All were reviewed, most notably -   Cr up to 1.5 , others stable    CXR was reviewed by me and revealed no acute disease or changes    Spirometry R FVC   %         FEV1      %        2575     %  10/28/2016 68 4.29 98 2.92 86 1.73 55   08/31/16 66 4.07 93 2.68 79 1.47 46   05/13/16 74 3.77 86 2.79 81 2.16 67   02/26/16 70 4.10 93 2.86 84 1.91 59   01/08/2016 72 3.79 86% 2.74 80% 1.97 61%   12/04/2015 73 3.6 82% 2.62 77% 1.91 59%   11/13/2015 79 3.13 71% 2.48 72% 2.13 66%   10/30/2015 84 3.29 76% 2.76 81% 3.02 95%   10/16/2015 89 3.03 70% 2.71 80% 3.3 103%   09/23/2015 82 3.32 77% 2.73 81% 2.72 85%   09/20/2015 85 3.26 75% 2.78 82% 2.76 87%   Interpretation - mild obstruction, but all measures slightly increased from last time.    IMPRESSION/PLAN:  66 year old man s/p BOLT 09/05/15 for COPD (CMV+/+, EBV+/+)     1. Lung transplant - Doing well from a lung function standpoint, and spirometry is slightly improved.    - Cont azithro for BOS ppx given that he is tolerating it well   - Cont IS - Pred 5, FK (8-10), Sirolimus 1mg  BID (MMF stopped)   - PPX - Bactrim and valacyclovir indefinitely    2. RLE DVT - diagnosed locally proximally 2 weeks ago.  Has been side are warfarin, but unfortunately has undergone no monitoring of his INR.  Somewhat  concerning given that he is having epistaxis on a nightly basis.   - Contact PCP to have them arrange for anticoagulation monitoring   - INR today    3. Medication confusion - I reviewed his medication list with him and printed out a new one on his AVS. Meds updated in Epic.    4. 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 Follow up 3 months     Patient seen and discussed with Dr. Waymond Cera.          -------------------------------------------------------------------------------------------  Attending: Alphonsa Overall  DOS 10/28/16  I saw and evaluated the patient with this fellow.  I repeated the essential components of the history and exam.  I discussed the case with the fellow, and have reviewed the fellow's note. I agree with the findings and plan as documented in the fellow's note.  Key findings based upon my evaluation and discussion:     Doing well from a pulmonary standpoint with stable spirometry. CT reviewed after clinic to follow nodules is stable and we'll repeat in 6 months. Importantly, he's had a difficult time with medication management. We spent  time reviewing his meds, creating a list, and reviewing some different strategies that may help him with medication management. Our RN coordinator also called his PCP's office to review. We'll see him back in 3 months.    Alphonsa Overall, MD  Attending Physician  Division of Pulmonary and [place] [facility]  -------------------------------------------------------------------------------------------

## 2016-10-28 NOTE — Progress Notes (Signed)
Nutrition Assessment     Patient Name: Dakota Marsh         Age: 66 year old       Hospital #:  [id number]    General Information:  Date seen: 10/28/16  Referred by: Lung Surgeon Transplant Team   Total Time Spent:30 Minutes  Medical Hx: Pt is s/p BOLT on 09/05/15 2/2 COPD    Reason for Visit:   Chief Complaint   Patient presents with   . Follow Up Visit   . Lung Transplant Follow-up       Assessment:  Patient Interview/Subjective: Mr. Bisson was referred to nutrition after his last visit on 08/31/16 2/2 continued wt gain; with peak wt of 191 lbs. Since his transplant pt has gained 50 lbs. Today his wt is down to 179 lbs. He associates his wt loss with getting the flu a few weeks ago, being busy moving and selling his house and was found to have a right LE DVT, now on coumadin. Of note pt reports he has not been seen in anticoag clinic or had blood levels drawn for INR since starting Warfarin. Provided pt with list of vitamin K rich foods and discussed need to be consistent with vitamin K intake and to talk to [place] clinic; RD altered post lung team of this.  His prednisone was reduced from 10 mg to 5 mg at his last visit. Pt reports BP's have ben elevated recently, which he associates with the stress of moving. He states BP was 170/103 a couple weeks ago at a local MD visit. He reports he was put on a BP medication, however he does not know dose or name of medication.   Appetite: good, improved since the flu and blood clot dx   GI symptoms:  Occasional nausea if he takes pills on empty stomach (takes Zofran prn)  BM/day: Yes, 1 day; formed  Oral nutrition supplements used: Yes, Premier protein x 1 day, drinks with dinner   Vitamins: Multi vitamin, 1,000 IU vitamin D3 tid, TUMS x 2 day  Hx of abnormal Glucose levels: No  Activity: recently moved, then got the flu. Signed up for the gym, but did not go 2/2 flu risk. He plans to start going to the gym soon  Diet Recall: Obtained and discussed with the patient.          Bfast= egg x 2, sausage patty x 1 and toast x 1 with butter and jam. 8 oz whole Milk or water  Lunch= sandwich (deli meat x ~ 2 oz) now some tuna for sandwich and potato chips  Dinner= sometimes he does not eat dinner. Will have steak/chicken x 4-6 oz, veggies (green beans/corn) and red potatoes. Will have premier protein    Fluids= water x ~ 2 L day    Estimated daily fluid intake: 2+ L  Estimated daily protein intake: 100 +/-  Estimated daily Kcal intake: 1100-1800    Objective Data:   Wt 178 lb 6.4 oz (80.922 kg) Ht 5' 8.622" (1.743 m) Body mass index is 26.64 kg/m.   IBW: 157-172 lbs    %IBW: 105  Wt hx:   07/29/15: 147 lbs  09/23/15: 140 lbs  12/04/15: 156 lbs  05/13/16: 160 lbs  08/31/16: 191 lbs  10/28/16: 178 lbs    Labs: No new labs to review at this time    Medications: Reviewed     Estimated Energy Needs: Weight used for calculations 78 kg (IBW)  1800 calories/day (~25  kcal/kg), 85-90 grams protein/day (1.1 gm/protein)    Evaluation of intake:   As reported, patient meeting 60-100% calories and >100% protein of estimated needs.  Labs: reveiwed and discussed with patient.     Evaluation of Nutritional Status: High nutrition complexity d/t recent transplant, wound healing, drug-nutrient interactions and nutrition education needs.      Nutrition Diagnosis:  Fluctuating energy intake related to pt with recent flu as evidenced by pt meeting 60-100% est needs    Interventions:  Provided Medical Nutrition Therapy for recovery post-transplant  Inadequate physical activity related to recent illness as evidenced by pt report )anticipate this will resolve soon)    Plan/Recommendations:  Include 10 HBV protein servings a day and distribute protein throughout the day - consume Premier protein as snack earlier in the day  Include ~1800 calories for weight maintenance to slight loss of adipose tissue  Consume </= 2300 mg sodium 2/2 HTN  Continue multivitamin, calcium, vitamin D and magnesium supplements  Provided and  reviewed 2000 mg Na and vitamin K food list with pt    Goal of Therapy:   Anabolism  Slight weight loss to stability    Diet adjustments based on nutrition-related labs and changes in nutritional status    Monitor/ Evaluation:  Nutrition-related labs  Wt  Adequacy of PO intake    Patient to call RD with questions.       Jola Baptist, CD   Phone: [phone]  Pager: [phone]

## 2016-10-29 DIAGNOSIS — R918 Other nonspecific abnormal finding of lung field: Secondary | ICD-10-CM | POA: Insufficient documentation

## 2016-10-30 ENCOUNTER — Inpatient Hospital Stay: Payer: Self-pay

## 2016-10-30 LAB — AFB CULTURE W/STAIN: AFB Stain: NONE SEEN

## 2016-11-04 ENCOUNTER — Other Ambulatory Visit (HOSPITAL_COMMUNITY): Payer: Self-pay

## 2016-11-04 LAB — BASIC METABOLIC PANEL, EXTERNAL
Calcium, External: 9.4
Carbon Dioxide (Total), External: 24
Chloride, External: 105
Creatinine, External: 1.7
Glucose, External: 96
Potassium, External: 4.3
Sodium, External: 144
Urea Nitrogen, External: 32

## 2016-11-04 LAB — TACROLIMUS, EXTERNAL: Tacrolimus, External: 7.2

## 2016-11-04 LAB — CMV_PCR QUANT, EXTERNAL: CMV Quant Result, External: NOT DETECTED

## 2016-11-05 ENCOUNTER — Encounter (HOSPITAL_BASED_OUTPATIENT_CLINIC_OR_DEPARTMENT_OTHER): Payer: Self-pay

## 2016-11-12 ENCOUNTER — Other Ambulatory Visit (HOSPITAL_COMMUNITY): Payer: Self-pay

## 2016-11-12 LAB — CBC (HEMOGRAM), EXTERNAL
Hematocrit, External: 29.9
Platelet Count, External: 269
WBC, External: 9.26

## 2016-11-12 LAB — BASIC METABOLIC PANEL, EXTERNAL
Calcium, External: 9.3
Carbon Dioxide (Total), External: 24
Chloride, External: 104
Creatinine, External: 1.4
Glucose, External: 98
Potassium, External: 4.7
Sodium, External: 143
Urea Nitrogen, External: 30

## 2016-11-12 LAB — CMV_PCR QUANT, EXTERNAL: CMV Quant Result, External: NOT DETECTED

## 2016-11-12 LAB — TACROLIMUS, EXTERNAL: Tacrolimus, External: 7

## 2016-11-12 LAB — CBC, DIFF, EXTERNAL: Neutrophils, External: 5.9

## 2016-11-13 ENCOUNTER — Encounter (HOSPITAL_BASED_OUTPATIENT_CLINIC_OR_DEPARTMENT_OTHER): Payer: Self-pay

## 2016-11-27 ENCOUNTER — Encounter (HOSPITAL_BASED_OUTPATIENT_CLINIC_OR_DEPARTMENT_OTHER): Payer: Self-pay

## 2016-11-30 ENCOUNTER — Encounter (HOSPITAL_BASED_OUTPATIENT_CLINIC_OR_DEPARTMENT_OTHER): Payer: Self-pay

## 2016-12-01 ENCOUNTER — Encounter (HOSPITAL_BASED_OUTPATIENT_CLINIC_OR_DEPARTMENT_OTHER): Payer: Self-pay

## 2016-12-01 ENCOUNTER — Other Ambulatory Visit (HOSPITAL_COMMUNITY): Payer: Self-pay

## 2016-12-01 LAB — SIROLIMUS, EXTERNAL: Sirolimus, External: <2

## 2016-12-01 LAB — BASIC METABOLIC PANEL, EXTERNAL
Calcium, External: 9.6
Carbon Dioxide (Total), External: 19
Chloride, External: 115
Creatinine, External: 1.9
Glucose, External: 117
Potassium, External: 4.7
Sodium, External: 144
Urea Nitrogen, External: 44

## 2016-12-02 ENCOUNTER — Encounter (HOSPITAL_BASED_OUTPATIENT_CLINIC_OR_DEPARTMENT_OTHER): Payer: Self-pay

## 2016-12-03 ENCOUNTER — Encounter (HOSPITAL_BASED_OUTPATIENT_CLINIC_OR_DEPARTMENT_OTHER): Payer: Self-pay

## 2016-12-09 ENCOUNTER — Other Ambulatory Visit (HOSPITAL_BASED_OUTPATIENT_CLINIC_OR_DEPARTMENT_OTHER): Payer: Self-pay

## 2016-12-09 ENCOUNTER — Other Ambulatory Visit (HOSPITAL_COMMUNITY): Payer: Self-pay

## 2016-12-09 DIAGNOSIS — Z942 Lung transplant status: Secondary | ICD-10-CM

## 2016-12-09 LAB — CBC (HEMOGRAM), EXTERNAL
Hematocrit, External: 31.5
Platelet Count, External: 265
WBC, External: 6.38

## 2016-12-09 LAB — BASIC METABOLIC PANEL, EXTERNAL
Calcium, External: 9.1
Carbon Dioxide (Total), External: 20
Chloride, External: 114
Creatinine, External: 1.6
Glucose, External: 95
Potassium, External: 5.3
Sodium, External: 143
Urea Nitrogen, External: 28

## 2016-12-09 LAB — SIROLIMUS, EXTERNAL: Sirolimus, External: 4.8

## 2016-12-09 LAB — CBC, DIFF, EXTERNAL: Neutrophils, External: 2.68

## 2016-12-09 LAB — TACROLIMUS, EXTERNAL: Tacrolimus, External: 11.1

## 2016-12-09 LAB — CMV_PCR QUANT, EXTERNAL: CMV Quant Result, External: NOT DETECTED

## 2016-12-09 MED ORDER — PANTOPRAZOLE SODIUM 40 MG OR TBEC
40.0000 mg | DELAYED_RELEASE_TABLET | Freq: Every day | ORAL | 11 refills | Status: DC
Start: 2016-12-09 — End: 2017-12-10

## 2016-12-10 ENCOUNTER — Encounter (HOSPITAL_BASED_OUTPATIENT_CLINIC_OR_DEPARTMENT_OTHER): Payer: Self-pay

## 2017-01-07 ENCOUNTER — Encounter (HOSPITAL_BASED_OUTPATIENT_CLINIC_OR_DEPARTMENT_OTHER): Payer: Self-pay

## 2017-01-07 ENCOUNTER — Other Ambulatory Visit (HOSPITAL_COMMUNITY): Payer: Self-pay

## 2017-01-07 LAB — CBC (HEMOGRAM), EXTERNAL
Hematocrit, External: 31.4
Hemoglobin, External: 9.9
Platelet Count, External: 236
WBC, External: 5.21

## 2017-01-07 LAB — BASIC METABOLIC PANEL, EXTERNAL
Calcium, External: 9.4
Carbon Dioxide (Total), External: 19
Chloride, External: 113
Creatinine, External: 2
Glucose, External: 107
Potassium, External: 4.7
Sodium, External: 143
Urea Nitrogen, External: 50

## 2017-01-07 LAB — CMV_PCR QUANT, EXTERNAL: CMV Quant Result, External: DETECTED

## 2017-01-07 LAB — SIROLIMUS, EXTERNAL: Sirolimus, External: 4.1

## 2017-01-07 LAB — TACROLIMUS, EXTERNAL: Tacrolimus, External: 7

## 2017-01-07 LAB — CBC, DIFF, EXTERNAL: Neutrophils, External: 2.26

## 2017-01-08 ENCOUNTER — Encounter (HOSPITAL_BASED_OUTPATIENT_CLINIC_OR_DEPARTMENT_OTHER): Payer: Self-pay

## 2017-01-15 ENCOUNTER — Other Ambulatory Visit (HOSPITAL_COMMUNITY): Payer: Self-pay

## 2017-01-15 ENCOUNTER — Encounter (HOSPITAL_BASED_OUTPATIENT_CLINIC_OR_DEPARTMENT_OTHER): Payer: Self-pay

## 2017-01-15 LAB — BASIC METABOLIC PANEL, EXTERNAL
Calcium, External: 9.1
Carbon Dioxide (Total), External: 21
Chloride, External: 114
Creatinine, External: 1.8
Glucose, External: 100
Potassium, External: 5.2
Sodium, External: 143
Urea Nitrogen, External: 44

## 2017-01-15 LAB — CMV_PCR QUANT, EXTERNAL: CMV Quant Result, External: NOT DETECTED

## 2017-01-18 ENCOUNTER — Encounter (HOSPITAL_BASED_OUTPATIENT_CLINIC_OR_DEPARTMENT_OTHER): Payer: Self-pay

## 2017-02-02 ENCOUNTER — Encounter (HOSPITAL_BASED_OUTPATIENT_CLINIC_OR_DEPARTMENT_OTHER): Payer: Medicare Other

## 2017-02-03 ENCOUNTER — Encounter (HOSPITAL_BASED_OUTPATIENT_CLINIC_OR_DEPARTMENT_OTHER): Payer: Medicare Other

## 2017-02-08 ENCOUNTER — Encounter (HOSPITAL_BASED_OUTPATIENT_CLINIC_OR_DEPARTMENT_OTHER): Payer: Self-pay

## 2017-02-11 ENCOUNTER — Other Ambulatory Visit (HOSPITAL_BASED_OUTPATIENT_CLINIC_OR_DEPARTMENT_OTHER): Payer: Self-pay | Admitting: Internal Medicine

## 2017-02-11 DIAGNOSIS — Z942 Lung transplant status: Secondary | ICD-10-CM

## 2017-02-11 DIAGNOSIS — Z48298 Encounter for aftercare following other organ transplant: Secondary | ICD-10-CM

## 2017-02-17 ENCOUNTER — Ambulatory Visit: Payer: Medicare Other | Attending: Internal Medicine | Admitting: Transplant

## 2017-02-17 ENCOUNTER — Other Ambulatory Visit (HOSPITAL_BASED_OUTPATIENT_CLINIC_OR_DEPARTMENT_OTHER): Payer: Self-pay | Admitting: Internal Medicine

## 2017-02-17 ENCOUNTER — Other Ambulatory Visit (HOSPITAL_COMMUNITY): Payer: Self-pay

## 2017-02-17 ENCOUNTER — Other Ambulatory Visit (HOSPITAL_BASED_OUTPATIENT_CLINIC_OR_DEPARTMENT_OTHER): Payer: Self-pay

## 2017-02-17 ENCOUNTER — Ambulatory Visit (HOSPITAL_BASED_OUTPATIENT_CLINIC_OR_DEPARTMENT_OTHER): Payer: Medicare Other

## 2017-02-17 ENCOUNTER — Encounter (HOSPITAL_BASED_OUTPATIENT_CLINIC_OR_DEPARTMENT_OTHER): Payer: Self-pay | Admitting: Transplant

## 2017-02-17 VITALS — BP 119/75 | HR 99 | Temp 98.2°F | Ht 69.0 in | Wt 170.0 lb

## 2017-02-17 DIAGNOSIS — Z48298 Encounter for aftercare following other organ transplant: Secondary | ICD-10-CM | POA: Insufficient documentation

## 2017-02-17 DIAGNOSIS — D899 Disorder involving the immune mechanism, unspecified: Secondary | ICD-10-CM | POA: Insufficient documentation

## 2017-02-17 DIAGNOSIS — Z6825 Body mass index (BMI) 25.0-25.9, adult: Secondary | ICD-10-CM

## 2017-02-17 DIAGNOSIS — Z942 Lung transplant status: Secondary | ICD-10-CM | POA: Insufficient documentation

## 2017-02-17 DIAGNOSIS — Z4824 Encounter for aftercare following lung transplant: Secondary | ICD-10-CM | POA: Insufficient documentation

## 2017-02-17 DIAGNOSIS — D849 Immunodeficiency, unspecified: Secondary | ICD-10-CM

## 2017-02-17 DIAGNOSIS — G8918 Other acute postprocedural pain: Secondary | ICD-10-CM

## 2017-02-17 DIAGNOSIS — R0789 Other chest pain: Secondary | ICD-10-CM | POA: Insufficient documentation

## 2017-02-17 DIAGNOSIS — R21 Rash and other nonspecific skin eruption: Secondary | ICD-10-CM | POA: Insufficient documentation

## 2017-02-17 DIAGNOSIS — R7989 Other specified abnormal findings of blood chemistry: Secondary | ICD-10-CM | POA: Insufficient documentation

## 2017-02-17 LAB — TACROLIMUS BY LCMSMS: Tacrolimus By LCMSMS: 6.6 ng/mL

## 2017-02-17 LAB — PULMONARY FUNCTION TESTING
FEF25-75-%Pred-Pre: 50
FEF25-75-Pre: 1.6
FEV1-%Pred-Pre: 84
FEV1-Pre: 2.86
FEV1/FVC-Pre: 66
FVC-%Pred-Pre: 99
FVC-Pre: 4.34

## 2017-02-17 LAB — SIROLIMUS BY LCMSMS: Sirolimus: 3.3 ng/mL

## 2017-02-17 MED ORDER — SIROLIMUS 1 MG OR TABS
2.0000 mg | ORAL_TABLET | Freq: Every day | ORAL | 11 refills | Status: DC
Start: 2017-02-17 — End: 2017-05-10

## 2017-02-17 NOTE — Progress Notes (Signed)
[facility]ULMON[facility] POST-TRANSPLANT VISIT    CC : Lung transplant follow-up, immunosuppression management, management of post-transplant complications     HPI:  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 He presented shortly thereafter with a sternal dehiscence complicated by PTX/pneumomediastinum, requiring OR 10/03/15 for sternal plating and re-wiring, and his sternal hardware was removed 08/2016 due to worsening pain. More recently, he developed a DVT 10/2016 after being immobile due to an ankle injury.     His last visit was 10/28/16. At that time he had been having a great deal of medication confusion, and was found to have an undetectable sirolimus level and his INR was 7.9. We spent some time reviewing meds with him and explaining the importance of this. He has been doing better with this since the last visit, and was able to recognize all meds on the list. His breathing has been doing well since the last time. Denies SOB, cough/sputum, f/c/ns, weight changes, GERD, n/v/abd pain, diarrhea, constipation, sinonasal symptoms, LE edema. After 3 months of anticoagulation his warfarin was stopped.    Only complaint today is neuropathic pain along his L side, around the clamshell incision, but also stretching posteriorly to the back in the location of his shingles episode 05/2016. He has developed an acneiform rash on his upper back and chest, but bilateral, not in the same location, which first started a few months ago but seems to be worse over past 2-3 weeks. Taking gabapentin for the post-herpetic pain, titrated up to 600 TID, which has been helpful but still fairly symptomatic.    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  Acetaminophen 325 MG Oral Tab, Take 650 mg by mouth every 6 hours as needed for pain., Disp: , Rfl:   .  Aspirin 81 MG Oral Tab, Take 81 mg by mouth daily., Disp: , Rfl:   .  Atorvastatin Calcium 20 MG Oral Tab, Take 20 mg by mouth., Disp: , Rfl:   .  Azithromycin 250 MG Oral Tab, Take one tablet daily for five days, then three times a week on Monday , Wednesday and Friday., Disp: 15 tablet, Rfl: 11  .  Cholecalciferol 1000 units Oral Tab, Take 1,000 Units by mouth daily., Disp: , Rfl:   .  Fluconazole 200 MG Oral Tab, Take 1 tablet (200 mg) by mouth daily., Disp: 90 tablet, Rfl: 3  .  Gabapentin 300 MG Oral Cap, Take 300 mg by mouth 3 times a day., Disp: , Rfl:   .  Magnesium Oxide 400 MG Oral Tab,  Take 400 mg by mouth 2 times a day., Disp: , Rfl:   .  Multiple Vitamins-Minerals (MULTIVITAMIN & MINERAL OR), Take 1 tablet by mouth daily., Disp: , Rfl:   .  Ondansetron 8 MG Oral TABLET DISPERSIBLE, Dissolve 1 tablet (8 mg) on top of tongue and swallow every 8 hours as needed for nausea/vomiting., Disp: 12 tablet, Rfl: 1  .  Pantoprazole Sodium 40 MG Oral Tab EC, Take 1 tablet (40 mg) by mouth daily., Disp: 30 tablet, Rfl: 11  .  Polyethylene Glycol 3350 Oral Powder, Take 17 g by mouth daily. Fill cap with powder to the 17 gram mark and dissolve in 4 to 8 ounces of water., Disp: , Rfl:   .  PredniSONE 10  MG Oral Tab, Take 0.5 tablets (5 mg) by mouth daily., Disp: 90 tablet, Rfl: 3  .  Sirolimus 1 MG Oral Tab, Take 1 tablet (1 mg) by mouth daily. Or as directed by provider., Disp: 30 tablet, Rfl: 11  .  Sulfamethoxazole-Trimethoprim 400-80 MG Oral Tab, Take 1 tablet by mouth daily., Disp: 30 tablet, Rfl: 11  .  Tacrolimus 1 MG Oral Cap, Take 1 capsule (1 mg) by mouth every 12 hours. Take 3 mg every 12 hours, Disp: 210 capsule, Rfl: 11  .  ValACYclovir HCl 1 g Oral Tab, Take 1 g by mouth daily., Disp: , Rfl:   .  Venlafaxine HCl 100 MG Oral Tab, Take 100 mg by mouth daily. , Disp: , Rfl:     ALLERGIES:  Codeine - anxiety/dyspnea    SH  Lives in [facility], outside of [place]. Living by himself, and recently sold his home and bought a new one. This has been helpful in moving on after his wife's death. Was working at [employer] but no longer.    PHYSICAL EXAM  BP 119/75   Pulse 99   Temp 98.2 F (36.8 C) (Temporal)   Ht 5\' 9"  (1.753 m)   Wt 170 lb (77.1 kg)   SpO2 99% Comment: RA  BMI 25.10 kg/m   General: Well appearing, comfortable appearing, appears stated age, NAD  HEENT: PERRL, moist mucous membranes, no mucosal lesions  Neck: Without LAD, no thyromegaly  Lungs: Chest shows a well healed incision, symmetric expansion, tympanic to percussion. No wheezes, crackles, or rhonci.  Heart: RRR, Nl S1/S2, without murmurs, gallops, rubs  Abdomen: Nondistended, +BS, soft, nontender, no guarding/rebound, no hepatosplenomegaly  Extremities: WWP, no clubbing or cyanosis, no edema  Skin: Aceniform rash with several small papules on upper back and chest. No vesicles. Rash is bilateral.   Neuro: Strength normal and symmetric  Psych: Normal affect, conversant        DATA:    Labs   All were reviewed, most notably -   Cr up to 1.5 , others stable    CXR was reviewed by me and revealed no acute disease or changes    Spirometry R FVC   %         FEV1      %        2575     %  02/17/17 66 4.34 99 2.86 84 1.60 50    10/28/2016 68 4.29 98 2.92 86 1.73 55   08/31/16 66 4.07 93 2.68 79 1.47 46   05/13/16 74 3.77 86 2.79 81 2.16 67   02/26/16 70 4.10 93 2.86 84 1.91 59   01/08/2016 72 3.79 86% 2.74 80% 1.97 61%  12/04/2015 73 3.6 82% 2.62 77% 1.91 59%   11/13/2015 79 3.13 71% 2.48 72% 2.13 66%   10/30/2015 84 3.29 76% 2.76 81% 3.02 95%   10/16/2015 89 3.03 70% 2.71 80% 3.3 103%   09/23/2015 82 3.32 77% 2.73 81% 2.72 85%   09/20/2015 85 3.26 75% 2.78 82% 2.76 87%   Interpretation -  Near normal and stable, only decrement is in the mid-flows    IMPRESSION/PLAN:  66 year old man s/p BOLT 09/05/15 for COPD (CMV+/+, EBV+/+)     1. Lung transplant - Doing well symptomatically and spirometry stable   - IS - FK (8-10), sirolimus (4-8), pred 5 (off MMF for CMV)   - Cont azithro for BOS0p   - PPX - Bactrim and valacyclovir indefinitely   - He is doing better with med comprehension/recall   - Worsening neuropathic pain, perhaps related to his clamshell but also perhaps post-herpetic. Can increase gabapentin to 900TID but I asked him to monitor for drowsiness. He will follow up with his PCP and/or call us if issues.   - CT scan ~4-5 months to follow up his upper lobe nodules    2. RLE DVT - Managed by his PCP, considered provoked after the ankle injury, now off anticoagulation    3. 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 Follow up 4 months

## 2017-02-18 ENCOUNTER — Other Ambulatory Visit (HOSPITAL_COMMUNITY): Payer: Self-pay

## 2017-02-18 LAB — LUNG TRX, POST OP EXTENDED PANEL
% Basophils: 0 %
% Eosinophils: 2 %
% Immature Granulocytes: 0 %
% Lymphocytes: 29 %
% Monocytes: 12 %
% Neutrophils: 57 %
% Nucleated RBC: 0 %
ALT (GPT): 13 U/L (ref 10–48)
AST (GOT): 16 U/L (ref 9–38)
Absolute Eosinophil Count: 0.13 10*3/uL (ref 0.00–0.50)
Absolute Lymphocyte Count: 2.28 10*3/uL (ref 1.00–4.80)
Albumin: 3.9 g/dL (ref 3.5–5.2)
Alkaline Phosphatase (Total): 70 U/L (ref 36–161)
Anion Gap: 8 (ref 4–12)
Basophils: 0.02 10*3/uL (ref 0.00–0.20)
Bilirubin (Total): 0.3 mg/dL (ref 0.2–1.3)
CMV Quant Result: NOT DETECTED [IU]/mL
Calcium: 9.1 mg/dL (ref 8.9–10.2)
Carbon Dioxide, Total: 26 meq/L (ref 22–32)
Chloride: 105 meq/L (ref 98–108)
Creatinine: 1.49 mg/dL — ABNORMAL HIGH (ref 0.51–1.18)
GFR, Calc, African American: 57 mL/min/{1.73_m2} — ABNORMAL LOW (ref >59)
GFR, Calc, European American: 47 mL/min/{1.73_m2} — ABNORMAL LOW (ref >59)
Glucose: 119 mg/dL (ref 62–125)
Hematocrit: 31 % — ABNORMAL LOW (ref 38–50)
Hemoglobin: 10 g/dL — ABNORMAL LOW (ref 13.0–18.0)
Immature Granulocytes: 0.03 10*3/uL (ref 0.00–0.05)
MCH: 30.4 pg (ref 27.3–33.6)
MCHC: 31.8 g/dL — ABNORMAL LOW (ref 32.2–36.5)
MCV: 95 fL (ref 81–98)
Magnesium: 1.9 mg/dL (ref 1.8–2.4)
Monocytes: 0.96 10*3/uL — ABNORMAL HIGH (ref 0.00–0.80)
Neutrophils: 4.54 10*3/uL (ref 1.80–7.00)
Nucleated RBC: 0 10*3/uL
Phosphate: 3 mg/dL (ref 2.5–4.5)
Platelet Count: 254 10*3/uL (ref 150–400)
Potassium: 4.3 meq/L (ref 3.6–5.2)
Protein (Total): 6.7 g/dL (ref 6.0–8.2)
RBC: 3.29 10*6/uL — ABNORMAL LOW (ref 4.40–5.60)
RDW-CV: 16.3 % — ABNORMAL HIGH (ref 11.6–14.4)
Sodium: 139 meq/L (ref 135–145)
Urea Nitrogen: 31 mg/dL — ABNORMAL HIGH (ref 8–21)
WBC: 7.96 10*3/uL (ref 4.3–10.0)

## 2017-02-19 ENCOUNTER — Other Ambulatory Visit (HOSPITAL_BASED_OUTPATIENT_CLINIC_OR_DEPARTMENT_OTHER): Payer: Self-pay | Admitting: Transplant

## 2017-02-19 DIAGNOSIS — Z942 Lung transplant status: Secondary | ICD-10-CM

## 2017-02-22 ENCOUNTER — Other Ambulatory Visit (HOSPITAL_BASED_OUTPATIENT_CLINIC_OR_DEPARTMENT_OTHER): Payer: Self-pay

## 2017-02-22 DIAGNOSIS — Z942 Lung transplant status: Secondary | ICD-10-CM

## 2017-02-22 MED ORDER — PREDNISONE 5 MG OR TABS
5.0000 mg | ORAL_TABLET | Freq: Every day | ORAL | 11 refills | Status: DC
Start: 2017-02-22 — End: 2018-06-29

## 2017-02-25 ENCOUNTER — Encounter (HOSPITAL_BASED_OUTPATIENT_CLINIC_OR_DEPARTMENT_OTHER): Payer: Self-pay

## 2017-02-26 ENCOUNTER — Encounter (HOSPITAL_BASED_OUTPATIENT_CLINIC_OR_DEPARTMENT_OTHER): Payer: Self-pay

## 2017-03-02 ENCOUNTER — Other Ambulatory Visit (HOSPITAL_BASED_OUTPATIENT_CLINIC_OR_DEPARTMENT_OTHER): Payer: Self-pay | Admitting: Transplant

## 2017-03-02 ENCOUNTER — Encounter (HOSPITAL_BASED_OUTPATIENT_CLINIC_OR_DEPARTMENT_OTHER): Payer: Self-pay

## 2017-03-02 ENCOUNTER — Other Ambulatory Visit (HOSPITAL_COMMUNITY): Payer: Self-pay

## 2017-03-02 DIAGNOSIS — Z942 Lung transplant status: Secondary | ICD-10-CM

## 2017-03-02 LAB — BASIC METABOLIC PANEL, EXTERNAL
Calcium, External: 9.5
Carbon Dioxide (Total), External: 24
Chloride, External: 108
Creatinine, External: 1.5
Glucose, External: 93
Potassium, External: 5.2
Sodium, External: 144
Urea Nitrogen, External: 27

## 2017-03-02 LAB — SIROLIMUS, EXTERNAL: Sirolimus, External: 5.9

## 2017-03-02 MED ORDER — GABAPENTIN 300 MG OR CAPS
900.0000 mg | ORAL_CAPSULE | Freq: Three times a day (TID) | ORAL | 0 refills | Status: AC
Start: 2017-03-02 — End: ?

## 2017-03-03 ENCOUNTER — Encounter (HOSPITAL_BASED_OUTPATIENT_CLINIC_OR_DEPARTMENT_OTHER): Payer: Self-pay

## 2017-03-31 ENCOUNTER — Encounter (HOSPITAL_BASED_OUTPATIENT_CLINIC_OR_DEPARTMENT_OTHER): Payer: Self-pay

## 2017-04-01 ENCOUNTER — Encounter (HOSPITAL_BASED_OUTPATIENT_CLINIC_OR_DEPARTMENT_OTHER): Payer: Self-pay

## 2017-04-06 ENCOUNTER — Encounter (HOSPITAL_BASED_OUTPATIENT_CLINIC_OR_DEPARTMENT_OTHER): Payer: Self-pay

## 2017-04-07 ENCOUNTER — Encounter (HOSPITAL_BASED_OUTPATIENT_CLINIC_OR_DEPARTMENT_OTHER): Payer: Self-pay

## 2017-04-08 ENCOUNTER — Encounter (HOSPITAL_BASED_OUTPATIENT_CLINIC_OR_DEPARTMENT_OTHER): Payer: Self-pay

## 2017-04-09 ENCOUNTER — Encounter (HOSPITAL_BASED_OUTPATIENT_CLINIC_OR_DEPARTMENT_OTHER): Payer: Self-pay

## 2017-04-09 ENCOUNTER — Other Ambulatory Visit (HOSPITAL_COMMUNITY): Payer: Self-pay

## 2017-04-09 LAB — BASIC METABOLIC PANEL, EXTERNAL
Calcium, External: 9.1
Carbon Dioxide (Total), External: 20
Chloride, External: 109
Creatinine, External: 1.6
Glucose, External: 99
Potassium, External: 4.4
Sodium, External: 140
Urea Nitrogen, External: 28

## 2017-04-09 LAB — CBC (HEMOGRAM), EXTERNAL
Hematocrit, External: 30.8
Platelet Count, External: 252
WBC, External: 7.62

## 2017-04-09 LAB — TACROLIMUS, EXTERNAL: Tacrolimus, External: 5.4

## 2017-04-09 LAB — CBC, DIFF, EXTERNAL: Neutrophils, External: 4.49

## 2017-04-09 LAB — SIROLIMUS, EXTERNAL: Sirolimus, External: 2.6

## 2017-04-22 ENCOUNTER — Encounter (HOSPITAL_BASED_OUTPATIENT_CLINIC_OR_DEPARTMENT_OTHER): Payer: Self-pay

## 2017-04-23 ENCOUNTER — Other Ambulatory Visit (HOSPITAL_COMMUNITY): Payer: Self-pay

## 2017-04-23 ENCOUNTER — Encounter (HOSPITAL_BASED_OUTPATIENT_CLINIC_OR_DEPARTMENT_OTHER): Payer: Self-pay

## 2017-04-23 LAB — BASIC METABOLIC PANEL, EXTERNAL
Calcium, External: 8.8
Carbon Dioxide (Total), External: 23
Chloride, External: 110
Creatinine, External: 1.3
Glucose, External: 109
Potassium, External: 4.9
Sodium, External: 142
Urea Nitrogen, External: 30

## 2017-04-23 LAB — CBC, DIFF, EXTERNAL: Neutrophils, External: 3.91

## 2017-04-23 LAB — CBC (HEMOGRAM), EXTERNAL
Hematocrit, External: 30.2
Platelet Count, External: 240
WBC, External: 7.14

## 2017-04-23 LAB — SIROLIMUS, EXTERNAL: Sirolimus, External: 7.9

## 2017-04-23 LAB — TACROLIMUS, EXTERNAL: Tacrolimus, External: 7

## 2017-05-10 ENCOUNTER — Other Ambulatory Visit (HOSPITAL_BASED_OUTPATIENT_CLINIC_OR_DEPARTMENT_OTHER): Payer: Self-pay

## 2017-05-10 ENCOUNTER — Other Ambulatory Visit (HOSPITAL_BASED_OUTPATIENT_CLINIC_OR_DEPARTMENT_OTHER): Payer: Self-pay | Admitting: Internal Medicine

## 2017-05-10 DIAGNOSIS — D849 Immunodeficiency, unspecified: Secondary | ICD-10-CM

## 2017-05-10 DIAGNOSIS — Z942 Lung transplant status: Secondary | ICD-10-CM

## 2017-05-10 MED ORDER — TACROLIMUS 1 MG OR CAPS
3.0000 mg | ORAL_CAPSULE | Freq: Two times a day (BID) | ORAL | 11 refills | Status: DC
Start: 2017-05-10 — End: 2017-05-10

## 2017-05-10 MED ORDER — SIROLIMUS 1 MG OR TABS
2.0000 mg | ORAL_TABLET | Freq: Every day | ORAL | 11 refills | Status: DC
Start: 2017-05-10 — End: 2018-01-06

## 2017-05-10 MED ORDER — SIROLIMUS 1 MG OR TABS
2.0000 mg | ORAL_TABLET | Freq: Every day | ORAL | 11 refills | Status: DC
Start: 2017-05-10 — End: 2017-05-10

## 2017-05-10 MED ORDER — TACROLIMUS 1 MG OR CAPS
3.0000 mg | ORAL_CAPSULE | Freq: Two times a day (BID) | ORAL | 11 refills | Status: DC
Start: 2017-05-10 — End: 2019-06-01

## 2017-05-13 ENCOUNTER — Other Ambulatory Visit (HOSPITAL_BASED_OUTPATIENT_CLINIC_OR_DEPARTMENT_OTHER): Payer: Self-pay | Admitting: Transplant

## 2017-05-13 DIAGNOSIS — Z48298 Encounter for aftercare following other organ transplant: Secondary | ICD-10-CM

## 2017-05-13 DIAGNOSIS — Z942 Lung transplant status: Secondary | ICD-10-CM

## 2017-05-27 ENCOUNTER — Encounter (HOSPITAL_BASED_OUTPATIENT_CLINIC_OR_DEPARTMENT_OTHER): Payer: Self-pay

## 2017-05-28 ENCOUNTER — Encounter (HOSPITAL_BASED_OUTPATIENT_CLINIC_OR_DEPARTMENT_OTHER): Payer: Self-pay

## 2017-05-31 ENCOUNTER — Other Ambulatory Visit (HOSPITAL_COMMUNITY): Payer: Self-pay

## 2017-05-31 LAB — CBC (HEMOGRAM), EXTERNAL
Hematocrit, External: 29.9
Platelet Count, External: 213
WBC, External: 7.76

## 2017-05-31 LAB — TACROLIMUS, EXTERNAL: Tacrolimus, External: 8

## 2017-05-31 LAB — BASIC METABOLIC PANEL, EXTERNAL
Calcium, External: 9.6
Carbon Dioxide (Total), External: 23
Chloride, External: 108
Creatinine, External: 2.1
Glucose, External: 96
Potassium, External: 5.3
Sodium, External: 141
Urea Nitrogen, External: 30

## 2017-05-31 LAB — CBC, DIFF, EXTERNAL: Neutrophils, External: 4.61

## 2017-05-31 LAB — SIROLIMUS, EXTERNAL: Sirolimus, External: 7.1

## 2017-06-01 ENCOUNTER — Encounter (HOSPITAL_BASED_OUTPATIENT_CLINIC_OR_DEPARTMENT_OTHER): Payer: Self-pay

## 2017-06-02 ENCOUNTER — Other Ambulatory Visit (HOSPITAL_BASED_OUTPATIENT_CLINIC_OR_DEPARTMENT_OTHER): Payer: Self-pay | Admitting: Transplant

## 2017-06-02 DIAGNOSIS — Z48298 Encounter for aftercare following other organ transplant: Secondary | ICD-10-CM

## 2017-06-02 DIAGNOSIS — Z942 Lung transplant status: Secondary | ICD-10-CM

## 2017-06-03 ENCOUNTER — Other Ambulatory Visit (HOSPITAL_COMMUNITY): Payer: Self-pay

## 2017-06-06 ENCOUNTER — Encounter (HOSPITAL_COMMUNITY): Payer: Self-pay

## 2017-06-06 NOTE — Progress Notes (Signed)
Update med list per [facility] transplant note 06/03/17

## 2017-06-09 ENCOUNTER — Encounter (HOSPITAL_COMMUNITY): Payer: Self-pay

## 2017-06-09 ENCOUNTER — Other Ambulatory Visit (HOSPITAL_BASED_OUTPATIENT_CLINIC_OR_DEPARTMENT_OTHER): Payer: Self-pay | Admitting: Transplant

## 2017-06-09 ENCOUNTER — Encounter (HOSPITAL_BASED_OUTPATIENT_CLINIC_OR_DEPARTMENT_OTHER): Payer: Self-pay | Admitting: Transplant

## 2017-06-09 ENCOUNTER — Ambulatory Visit (HOSPITAL_BASED_OUTPATIENT_CLINIC_OR_DEPARTMENT_OTHER): Payer: Medicare Other

## 2017-06-09 ENCOUNTER — Ambulatory Visit: Payer: Medicare Other | Attending: Transplant | Admitting: Transplant

## 2017-06-09 ENCOUNTER — Other Ambulatory Visit (HOSPITAL_COMMUNITY): Payer: Self-pay

## 2017-06-09 VITALS — BP 127/69 | HR 86 | Temp 98.4°F | Ht 69.0 in | Wt 163.0 lb

## 2017-06-09 DIAGNOSIS — D849 Immunodeficiency, unspecified: Secondary | ICD-10-CM

## 2017-06-09 DIAGNOSIS — R918 Other nonspecific abnormal finding of lung field: Secondary | ICD-10-CM

## 2017-06-09 DIAGNOSIS — Z942 Lung transplant status: Secondary | ICD-10-CM

## 2017-06-09 DIAGNOSIS — Z4824 Encounter for aftercare following lung transplant: Secondary | ICD-10-CM | POA: Insufficient documentation

## 2017-06-09 DIAGNOSIS — R7989 Other specified abnormal findings of blood chemistry: Secondary | ICD-10-CM | POA: Insufficient documentation

## 2017-06-09 DIAGNOSIS — Z48298 Encounter for aftercare following other organ transplant: Secondary | ICD-10-CM

## 2017-06-09 DIAGNOSIS — J42 Unspecified chronic bronchitis: Secondary | ICD-10-CM | POA: Insufficient documentation

## 2017-06-09 DIAGNOSIS — Z6824 Body mass index (BMI) 24.0-24.9, adult: Secondary | ICD-10-CM

## 2017-06-09 DIAGNOSIS — D899 Disorder involving the immune mechanism, unspecified: Secondary | ICD-10-CM

## 2017-06-09 LAB — PULMONARY FUNCTION TESTING
FEF25-75-%Pred-Pre: 46
FEF25-75-Pre: 1.47
FEV1-%Pred-Pre: 83
FEV1-Pre: 2.84
FEV1/FVC-Pre: 65
FVC-%Pred-Pre: 100
FVC-Pre: 4.36

## 2017-06-09 LAB — LAB ADD ON ORDER

## 2017-06-09 LAB — TACROLIMUS BY LCMSMS: Tacrolimus By LCMSMS: 6 ng/mL

## 2017-06-09 LAB — SIROLIMUS BY LCMSMS: Sirolimus: 8.1 ng/mL

## 2017-06-09 NOTE — Progress Notes (Signed)
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 He has encountered a few issues as detailed below in [place] but has been fairly stable over the past several months. He was last seen in clinic 02/17/17. Since that time, overall doing well. Has been active, doing a lot of walking, and spending some time outdoors, including a recent hunting trip. 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 He enjoyed this very much, and hopes to go there again soon and spend a few of the winter months. From a symptomatic standpoint doing well, with the exception of recent leg swelling with subsequent diagnosis of a LLE DVT (after having a previous DVT 10/2016 and completing anticoagulation). He is now back on warfarin and this is slowly improving.    Otherwise, denies SOB, cough/sputum, f/c/ns, weight changes, GERD, n/v/abd pain, diarrhea, constipation, sinonasal symptoms. Continues to have some neuropathic pain along his L side, around the clamshell incision, but also stretching posteriorly to the back in the location of his shingles episode 05/2016. He reduced gabapentin to 600 TID and pain is tolerable.     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)  HTN  CAD (LHC 06/2015 with 50% distal RCA stenosis)  Depression/anxiety  Osteoporosis - T score -2.0 on DEXA 05/2015   - Repeat 05/2017 with T score -2.1  DVTs   - First R sided 10/2016, felt to be provoked following ankle injury,  completed 3 months warfarin   - LLE 05/2017, now indefinite warfarin  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, baseline Cr ~1.5  Cataracts sp-surgery 01/2017      Current Outpatient Prescriptions:     Acetaminophen 325 MG Oral Tab, Take 650 mg by mouth every 6 hours as needed for pain., Disp: , Rfl:     Aspirin 81 MG Oral Tab, Take 81 mg by mouth daily., Disp: , Rfl:     Atorvastatin Calcium 20 MG Oral Tab, Take 20 mg by mouth., Disp: , Rfl:     Azithromycin 250 MG Oral Tab, Take one tablet daily for five days, then three times a week on Monday , Wednesday and Friday., Disp: 15 tablet, Rfl: 11    Cholecalciferol 1000 units Oral Tab, Take 1,000 Units by mouth daily., Disp: , Rfl:     Fluconazole 200 MG Oral Tab, Take 1 tablet (200 mg) by mouth daily., Disp: 90 tablet, Rfl: 3    Gabapentin 300 MG Oral Cap, Take 3 capsules (900 mg) by mouth 3 times a day., Disp: 270 capsule, Rfl: 0    Magnesium Oxide 400 MG Oral  Tab, Take 400 mg by mouth 2 times a day., Disp: , Rfl:     Multiple Vitamins-Minerals (MULTIVITAMIN & MINERAL OR), Take 1 tablet by mouth daily., Disp: , Rfl:     Pantoprazole Sodium 40 MG Oral Tab EC, Take 1 tablet (40 mg) by mouth daily., Disp: 30 tablet, Rfl: 11    PredniSONE 5 MG Oral Tab, Take 1 tablet (5 mg) by mouth daily., Disp: 30 tablet, Rfl: 11    Sirolimus 1 MG Oral Tab, Take 2 tablets (2 mg) by mouth daily. Or as directed by provider., Disp: 60 tablet, Rfl: 11    Sulfamethoxazole-Trimethoprim 400-80 MG Oral Tab, Take 1 tablet by mouth daily., Disp: 30 tablet, Rfl: 11    Tacrolimus 1 MG Oral Cap, Take 3 capsules (3 mg) by mouth every 12 hours. (Patient  taking differently: Take by mouth every 12 hours. 2 mg in the morning and 3 mg in the evening), Disp: 180 capsule, Rfl: 11    ValACYclovir HCl 1 g Oral Tab, Take 1 g by mouth daily., Disp: , Rfl:     Venlafaxine HCl 100 MG Oral Tab, Take 100 mg by mouth daily. , Disp: , Rfl:     ALLERGIES:  Codeine - anxiety/dyspnea    SH  Lives in [facility], outside of [place]. Living by himself. Considering staying in [place] for the winter as above.    PHYSICAL EXAM  BP 127/69    Pulse 86    Temp 98.4 F (36.9 C) (Temporal)    Ht _0  (1.753 m)    Wt 163 lb (73.9 kg)    SpO2 100% Comment: RA   BMI 24.07 kg/m   General: Well appearing, comfortable appearing, appears stated age, NAD  HEENT: PERRL, moist mucous membranes, no mucosal lesions  Neck: Without LAD, no thyromegaly  Lungs: Chest shows a well healed incision, symmetric expansion, tympanic to percussion. No wheezes, crackles, or rhonci.  Heart: RRR, Nl S1/S2, without murmurs, gallops, rubs  Abdomen: Nondistended, +BS, soft, nontender, no guarding/rebound, no hepatosplenomegaly  Extremities: WWP, no clubbing or cyanosis, no edema  Skin: Does have some skin irritation/dermatitis on face (following with derm regarding this  Neuro: Strength normal and symmetric  Psych: Normal affect, conversant    DATA:    Labs   All were reviewed, most notably -   Cr 1.3, others stable      CT was personally reviewed by me and revealed stable (to slightly enlarged, mainly upper lobe predominant) nodules. Too small for biopsy, too small for PET, present back in 08/2016 at which time BAL was negative.    Spirometry R FVC   %     FEV1    %    2575     %  06/09/17 65 4.36 100 2.84 83 1.47 46   02/17/17 66 4.34 99 2.86 84 1.60 50   10/28/2016 68 4.29 98 2.92 86 1.73 55   08/31/16 66 4.07 93 2.68 79 1.47 46   05/13/16 74 3.77 86 2.79 81 2.16 67   02/26/16 70 4.10 93 2.86 84 1.91 59   01/08/2016 72 3.79 86% 2.74 80% 1.97 61%   12/04/2015 73 3.6 82% 2.62 77% 1.91 59%   11/13/2015 79 3.13 71%  2.48 72% 2.13 66%   10/30/2015 84 3.29 76% 2.76 81% 3.02 95%   10/16/2015 89 3.03 70% 2.71 80% 3.3 103%   09/23/2015 82 3.32 77% 2.73 81% 2.72 85%   09/20/2015 85 3.26 75%  2.78 82% 2.76 87%   Interpretation -  Near normal and stable, BOS0p    IMPRESSION/PLAN:  67 year old man s/p BOLT 09/05/15 for COPD (CMV+/+, EBV+/+)     1. Lung transplant - Doing well symptomatically and spirometry stable   - IS - FK (6-8), sirolimus (4-8), pred 5 (off MMF for prior CMV)   - Cont azithro for BOS0p   - PPX - Bactrim and valacyclovir indefinitely   - He is doing better with med comprehension/recall   - CT scan ~3 months to follow his nodules. Add on EBV.     2. 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 Would favor lifelong anticoagulation.    3. 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 Follow up 6 months . OK to travel to [place] with plan to establish primary and lab care locally.

## 2017-06-10 LAB — LUNG TRX, POST OP EXTENDED PANEL
% Basophils: 0 %
% Eosinophils: 2 %
% Immature Granulocytes: 1 %
% Lymphocytes: 29 %
% Monocytes: 12 %
% Neutrophils: 56 %
% Nucleated RBC: 0 %
ALT (GPT): 13 U/L (ref 10–48)
AST (GOT): 19 U/L (ref 9–38)
Absolute Eosinophil Count: 0.12 10*3/uL (ref 0.00–0.50)
Absolute Lymphocyte Count: 2.02 10*3/uL (ref 1.00–4.80)
Albumin: 3.9 g/dL (ref 3.5–5.2)
Alkaline Phosphatase (Total): 86 U/L (ref 36–161)
Anion Gap: 7 (ref 4–12)
Basophils: 0.02 10*3/uL (ref 0.00–0.20)
Bilirubin (Total): 0.2 mg/dL (ref 0.2–1.3)
CMV Quant Result: NOT DETECTED [IU]/mL
Calcium: 8.9 mg/dL (ref 8.9–10.2)
Carbon Dioxide, Total: 24 meq/L (ref 22–32)
Chloride: 107 meq/L (ref 98–108)
Creatinine: 1.33 mg/dL — ABNORMAL HIGH (ref 0.51–1.18)
GFR, Calc, African American: >60 mL/min/{1.73_m2} (ref >59)
GFR, Calc, European American: 54 mL/min/{1.73_m2} — ABNORMAL LOW (ref >59)
Glucose: 91 mg/dL (ref 62–125)
Hematocrit: 30 % — ABNORMAL LOW (ref 38–50)
Hemoglobin: 9.2 g/dL — ABNORMAL LOW (ref 13.0–18.0)
Immature Granulocytes: 0.04 10*3/uL (ref 0.00–0.05)
MCH: 28.9 pg (ref 27.3–33.6)
MCHC: 31 g/dL — ABNORMAL LOW (ref 32.2–36.5)
MCV: 93 fL (ref 81–98)
Magnesium: 2 mg/dL (ref 1.8–2.4)
Monocytes: 0.8 10*3/uL (ref 0.00–0.80)
Neutrophils: 3.86 10*3/uL (ref 1.80–7.00)
Nucleated RBC: 0 10*3/uL
Phosphate: 2.6 mg/dL (ref 2.5–4.5)
Platelet Count: 241 10*3/uL (ref 150–400)
Potassium: 3.9 meq/L (ref 3.6–5.2)
Protein (Total): 6.8 g/dL (ref 6.0–8.2)
RBC: 3.18 10*6/uL — ABNORMAL LOW (ref 4.40–5.60)
RDW-CV: 14.2 % (ref 11.6–14.4)
Sodium: 138 meq/L (ref 135–145)
Urea Nitrogen: 25 mg/dL — ABNORMAL HIGH (ref 8–21)
WBC: 6.86 10*3/uL (ref 4.3–10.0)

## 2017-06-10 LAB — EBV QUANT BY PCR: EBV Quant Result: NOT DETECTED [IU]/mL

## 2017-07-26 ENCOUNTER — Encounter (HOSPITAL_BASED_OUTPATIENT_CLINIC_OR_DEPARTMENT_OTHER): Payer: Self-pay

## 2017-07-27 ENCOUNTER — Other Ambulatory Visit (HOSPITAL_BASED_OUTPATIENT_CLINIC_OR_DEPARTMENT_OTHER): Payer: Self-pay | Admitting: Transplant

## 2017-07-27 DIAGNOSIS — Z942 Lung transplant status: Secondary | ICD-10-CM

## 2017-07-27 MED ORDER — FLUCONAZOLE 200 MG OR TABS
ORAL_TABLET | ORAL | 3 refills | Status: DC
Start: 2017-07-27 — End: 2017-11-18

## 2017-07-28 ENCOUNTER — Other Ambulatory Visit (HOSPITAL_COMMUNITY): Payer: Self-pay

## 2017-07-28 LAB — BASIC METABOLIC PANEL, EXTERNAL
Calcium, External: 8.7
Carbon Dioxide (Total), External: 23
Chloride, External: 111
Creatinine, External: 1.7
Glucose, External: 87
Potassium, External: 4.9
Sodium, External: 143
Urea Nitrogen, External: 37

## 2017-07-28 LAB — SIROLIMUS, EXTERNAL: Sirolimus, External: 8.6

## 2017-07-28 LAB — CBC (HEMOGRAM), EXTERNAL
Hematocrit, External: 28.3
Platelet Count, External: 214
WBC, External: 4.4

## 2017-07-28 LAB — TACROLIMUS, EXTERNAL: Tacrolimus, External: 10.8

## 2017-07-28 LAB — CBC, DIFF, EXTERNAL: Neutrophils, External: 2.19

## 2017-08-02 ENCOUNTER — Other Ambulatory Visit (HOSPITAL_COMMUNITY): Payer: Self-pay

## 2017-08-12 ENCOUNTER — Other Ambulatory Visit (HOSPITAL_COMMUNITY): Payer: Self-pay

## 2017-08-12 LAB — LIPID PANEL, EXTERNAL
Cholesterol (HDL), External: 66
Cholesterol (LDL), External: 66
Cholesterol (Total), External: 147
Triglyceride, External: 72

## 2017-08-12 LAB — TACROLIMUS, EXTERNAL: Tacrolimus, External: 4.9

## 2017-08-12 LAB — HEPATIC FUNCTION PANEL, EXTERNAL
ALT (GPT), External: 13
AST (GOT), External: 16
Albumin, External: 3.9
Alkaline Phosphatase (Total), External: 73
Bilirubin (Total), External: 0.2
Protein (Total), External: 6.4

## 2017-08-12 LAB — BASIC METABOLIC PANEL, EXTERNAL
Calcium, External: 8.6
Carbon Dioxide (Total), External: 23
Chloride, External: 109
Creatinine, External: 1.22
Glucose, External: 80
Potassium, External: 4.4
Sodium, External: 141
Urea Nitrogen, External: 27

## 2017-08-12 LAB — SIROLIMUS, EXTERNAL: Sirolimus, External: 7.9

## 2017-08-12 LAB — CBC (HEMOGRAM), EXTERNAL
Hematocrit, External: 27.6
Platelet Count, External: 226
WBC, External: 8.2

## 2017-08-12 LAB — HEMOGLOBIN A1C, EXTERNAL: Hemoglobin A1C, External: 6.2

## 2017-08-12 LAB — CBC, DIFF, EXTERNAL: Neutrophils, External: 5.28

## 2017-08-13 ENCOUNTER — Encounter (HOSPITAL_BASED_OUTPATIENT_CLINIC_OR_DEPARTMENT_OTHER): Payer: Self-pay | Admitting: Internal Medicine

## 2017-08-13 ENCOUNTER — Other Ambulatory Visit: Payer: Self-pay

## 2017-08-17 ENCOUNTER — Other Ambulatory Visit (HOSPITAL_BASED_OUTPATIENT_CLINIC_OR_DEPARTMENT_OTHER): Payer: Self-pay | Admitting: Transplant

## 2017-08-17 ENCOUNTER — Other Ambulatory Visit (HOSPITAL_COMMUNITY): Payer: Self-pay

## 2017-08-17 DIAGNOSIS — Z942 Lung transplant status: Secondary | ICD-10-CM

## 2017-08-17 MED ORDER — TACROLIMUS 0.5 MG OR CAPS
0.5000 mg | ORAL_CAPSULE | Freq: Every morning | ORAL | 11 refills | Status: DC
Start: 2017-08-17 — End: 2018-06-29

## 2017-08-31 ENCOUNTER — Other Ambulatory Visit (HOSPITAL_COMMUNITY): Payer: Self-pay

## 2017-08-31 LAB — HEPATIC FUNCTION PANEL, EXTERNAL
ALT (GPT), External: 13
AST (GOT), External: 18
Albumin, External: 3.9
Alkaline Phosphatase (Total), External: 74
Bilirubin (Total), External: 0.3
Protein (Total), External: 6.6

## 2017-08-31 LAB — BASIC METABOLIC PANEL, EXTERNAL
Calcium, External: 8.5
Carbon Dioxide (Total), External: 21
Chloride, External: 107
Creatinine, External: 1.72
Glucose, External: 161
Potassium, External: 4.1
Sodium, External: 139
Urea Nitrogen, External: 42

## 2017-08-31 LAB — SIROLIMUS, EXTERNAL: Sirolimus, External: 9.5

## 2017-08-31 LAB — CBC, DIFF, EXTERNAL: Neutrophils, External: 2.739

## 2017-08-31 LAB — CBC (HEMOGRAM), EXTERNAL
Hematocrit, External: 30.2
Platelet Count, External: 284
WBC, External: 4.9

## 2017-08-31 LAB — TACROLIMUS, EXTERNAL: Tacrolimus, External: 7.5

## 2017-10-13 ENCOUNTER — Encounter (HOSPITAL_BASED_OUTPATIENT_CLINIC_OR_DEPARTMENT_OTHER): Payer: Self-pay

## 2017-10-14 ENCOUNTER — Other Ambulatory Visit (HOSPITAL_COMMUNITY): Payer: Self-pay

## 2017-10-14 LAB — BASIC METABOLIC PANEL, EXTERNAL
Calcium, External: 8.6
Carbon Dioxide (Total), External: 23
Chloride, External: 106
Creatinine, External: 1.6
Glucose, External: 137
Potassium, External: 4.3
Sodium, External: 138
Urea Nitrogen, External: 30

## 2017-10-14 LAB — CBC, DIFF, EXTERNAL: Neutrophils, External: 2.73

## 2017-10-14 LAB — SIROLIMUS, EXTERNAL: Sirolimus, External: 6.3

## 2017-10-14 LAB — CBC (HEMOGRAM), EXTERNAL
Hematocrit, External: 27
Platelet Count, External: 232
WBC, External: 5.7

## 2017-10-14 LAB — TACROLIMUS, EXTERNAL: Tacrolimus, External: 6.4

## 2017-11-18 ENCOUNTER — Other Ambulatory Visit (HOSPITAL_BASED_OUTPATIENT_CLINIC_OR_DEPARTMENT_OTHER): Payer: Self-pay | Admitting: Transplant

## 2017-11-18 DIAGNOSIS — Z942 Lung transplant status: Secondary | ICD-10-CM

## 2017-11-18 MED ORDER — FLUCONAZOLE 200 MG OR TABS
ORAL_TABLET | ORAL | 3 refills | Status: DC
Start: 2017-11-18 — End: 2018-06-29

## 2017-12-01 ENCOUNTER — Other Ambulatory Visit (HOSPITAL_BASED_OUTPATIENT_CLINIC_OR_DEPARTMENT_OTHER): Payer: Self-pay | Admitting: Internal Medicine

## 2017-12-01 DIAGNOSIS — Z942 Lung transplant status: Secondary | ICD-10-CM

## 2017-12-01 DIAGNOSIS — Z48298 Encounter for aftercare following other organ transplant: Secondary | ICD-10-CM

## 2017-12-08 ENCOUNTER — Encounter (HOSPITAL_BASED_OUTPATIENT_CLINIC_OR_DEPARTMENT_OTHER): Payer: Self-pay | Admitting: Transplant

## 2017-12-08 ENCOUNTER — Other Ambulatory Visit (HOSPITAL_COMMUNITY): Payer: Self-pay

## 2017-12-08 ENCOUNTER — Encounter (HOSPITAL_COMMUNITY): Payer: Self-pay

## 2017-12-08 ENCOUNTER — Other Ambulatory Visit (HOSPITAL_BASED_OUTPATIENT_CLINIC_OR_DEPARTMENT_OTHER): Payer: Self-pay | Admitting: Internal Medicine

## 2017-12-08 ENCOUNTER — Other Ambulatory Visit (HOSPITAL_BASED_OUTPATIENT_CLINIC_OR_DEPARTMENT_OTHER): Payer: Self-pay

## 2017-12-08 ENCOUNTER — Ambulatory Visit (HOSPITAL_BASED_OUTPATIENT_CLINIC_OR_DEPARTMENT_OTHER): Payer: Medicare Other

## 2017-12-08 ENCOUNTER — Ambulatory Visit (HOSPITAL_BASED_OUTPATIENT_CLINIC_OR_DEPARTMENT_OTHER): Payer: Medicare Other | Admitting: Transplant

## 2017-12-08 ENCOUNTER — Ambulatory Visit: Payer: Medicare Other | Attending: Internal Medicine

## 2017-12-08 VITALS — BP 110/69 | HR 92 | Temp 98.6°F | Ht 69.0 in | Wt 160.0 lb

## 2017-12-08 DIAGNOSIS — R918 Other nonspecific abnormal finding of lung field: Secondary | ICD-10-CM

## 2017-12-08 DIAGNOSIS — Z942 Lung transplant status: Secondary | ICD-10-CM

## 2017-12-08 DIAGNOSIS — D899 Disorder involving the immune mechanism, unspecified: Secondary | ICD-10-CM | POA: Insufficient documentation

## 2017-12-08 DIAGNOSIS — D849 Immunodeficiency, unspecified: Secondary | ICD-10-CM

## 2017-12-08 DIAGNOSIS — Z4824 Encounter for aftercare following lung transplant: Secondary | ICD-10-CM

## 2017-12-08 DIAGNOSIS — Z6823 Body mass index (BMI) 23.0-23.9, adult: Secondary | ICD-10-CM

## 2017-12-08 DIAGNOSIS — Z48298 Encounter for aftercare following other organ transplant: Secondary | ICD-10-CM | POA: Insufficient documentation

## 2017-12-08 DIAGNOSIS — Z79899 Other long term (current) drug therapy: Secondary | ICD-10-CM | POA: Insufficient documentation

## 2017-12-08 LAB — SIROLIMUS BY LCMSMS: Sirolimus: 11.2 ng/mL

## 2017-12-08 LAB — PULMONARY FUNCTION TESTING
FEF25-75-%Pred-Pre: 53
FEF25-75-Pre: 1.67
FEV1-%Pred-Pre: 86
FEV1-Pre: 2.92
FEV1/FVC-Pre: 67
FVC-%Pred-Pre: 100
FVC-Pre: 4.34

## 2017-12-08 LAB — TACROLIMUS BY LCMSMS: Tacrolimus By LCMSMS: 8.4 ng/mL

## 2017-12-08 MED ORDER — SIROLIMUS 0.5 MG OR TABS
0.5000 mg | ORAL_TABLET | Freq: Every morning | ORAL | 11 refills | Status: DC
Start: 2017-12-08 — End: 2018-01-06

## 2017-12-08 NOTE — Progress Notes (Signed)
[facility] PULMONARY POST-TRANSPLANT VISIT    CC : Lung transplant follow-up, immunosuppression management, management of post-transplant complications     HPI:  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 He was last seen in clinic 06/09/17. Since that time, overall doing well. He spent the entire winter in [place]. Living with his partner and has several friend in the area. 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 Trying to decide whether to move here permanently but wants to wait and see how he tolerates the summer. He has been active, doing a lot of walking, working hard. Denies SOB, cough/sputum, f/c/ns, weight changes, GERD, n/v/abd pain, diarrhea, constipation, sinonasal symptoms. Has had some recent issues with LE edema and was started on furosemide by his PCP, which was then switched to torsemide due to poor response.     Still with some post-herpetic neuralgia for which he takes gabapentin. Also trying a menthol cream which has been helpful. Has seen dermatology and had an SCC removed. Also had cataract surgery within the past year.    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)  HTN  CAD (LHC 06/2015 with 50% distal RCA stenosis)  Depression/anxiety  Osteoporosis - T score -2.0 on DEXA 05/2015   - Repeat 05/2017 with T score -2.1  DVTs   - First R sided 10/2016, felt to be provoked following ankle injury,   completed 3 months warfarin   - LLE 05/2017, now indefinite warfarin  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, baseline Cr ~1.5  Cataracts sp-surgery 01/2017  SCCs skin      Current Outpatient Medications:     Acetaminophen 325 MG Oral Tab, Take 650 mg by mouth every 6 hours as needed for pain., Disp: , Rfl:     Aspirin 81 MG Oral Tab, Take 81 mg by mouth daily., Disp: , Rfl:     Atorvastatin Calcium 20 MG Oral Tab, Take 20 mg by mouth., Disp: , Rfl:     Azithromycin 250 MG Oral Tab, Take one tablet daily for five days, then three times a week on Monday , Wednesday and Friday., Disp: 15 tablet, Rfl: 11    Cholecalciferol 1000 units Oral Tab, Take 1,000 Units by mouth daily., Disp: , Rfl:     Fluconazole 200 MG Oral Tab, TAKE 1 TABLET BY MOUTH ONCE DAILY, Disp: 90 tablet, Rfl: 3    Gabapentin 300 MG Oral Cap, Take 3 capsules (900 mg) by mouth 3 times a day. (Patient taking differently: Take 600 mg by mouth 3 times a day. ), Disp: 270 capsule, Rfl: 0    Magnesium Oxide 400 MG Oral Tab, Take 400 mg by mouth 2 times a day., Disp: , Rfl:     Multiple Vitamins-Minerals (MULTIVITAMIN & MINERAL OR), Take 1 tablet by mouth daily., Disp: , Rfl:     Pantoprazole Sodium 40 MG Oral Tab EC, Take  1 tablet (40 mg) by mouth daily., Disp: 30 tablet, Rfl: 11    PredniSONE 5 MG Oral Tab, Take 1 tablet (5 mg) by mouth daily., Disp: 30 tablet, Rfl: 11    Sirolimus 1 MG Oral Tab, Take 2 tablets (2 mg) by mouth daily. Or as directed by provider., Disp: 60 tablet, Rfl: 11    Sulfamethoxazole-Trimethoprim 400-80 MG Oral Tab, Take 1 tablet by mouth daily., Disp: 30 tablet, Rfl: 11    Tacrolimus 0.5 MG Oral Cap, Take 1 capsule (0.5 mg) by mouth every morning. Or as directed by provider., Disp: 30 capsule, Rfl: 11    Tacrolimus 1 MG Oral Cap, Take 3 capsules (3 mg) by mouth every 12 hours. (Patient taking differently: Take by mouth every 12 hours. 2 mg in the morning and 3 mg in the evening), Disp: 180 capsule, Rfl: 11    ValACYclovir HCl 1 g Oral  Tab, Take 1 g by mouth daily., Disp: , Rfl:     WARFARIN SODIUM OR, , Disp: , Rfl:     ALLERGIES:  Codeine - anxiety/dyspnea    SH  Home is [facility], outside of [place]. Has been staying in [place] as per HPI.    PHYSICAL EXAM  BP 110/69    Pulse 92    Temp 98.6 F (37 C) (Temporal)    Ht 5\' 9"  (1.753 m)    Wt 160 lb (72.6 kg)    SpO2 100% Comment: RA   BMI 23.63 kg/m   General: Well appearing, comfortable appearing, appears stated age, NAD  HEENT: PERRL, moist mucous membranes, no mucosal lesions  Neck: Without LAD, no thyromegaly  Lungs: Chest shows a well healed incision, symmetric expansion, tympanic to percussion. No wheezes, crackles, or rhonci.  Heart: RRR, Nl S1/S2, without murmurs, gallops, rubs  Abdomen: Nondistended, +BS, soft, nontender, no guarding/rebound, no hepatosplenomegaly  Extremities: WWP, no clubbing or cyanosis, no edema  Skin: Continues to have some skin irritation/dermatitis on R side of face (following with derm regarding this)  Neuro: Strength normal and symmetric  Psych: Normal affect, conversant    DATA:    Labs   All were reviewed, most notably -   BUN/Cr were pending at the time of visit but came back later at 86/3.6, mag 2.9  CBC stable    CXR was reviewed by me and revealed no acute disease or changes    Spirometry R FVC   %     FEV1    %    2575     %  12/08/17 67 4.34 100 2.92 86 1.67 53   06/09/17 65 4.36 100 2.84 83 1.47 46   02/17/17 66 4.34 99 2.86 84 1.60 50   10/28/2016 68 4.29 98 2.92 86 1.73 55   08/31/16 66 4.07 93 2.68 79 1.47 46   05/13/16 74 3.77 86 2.79 81 2.16 67   02/26/16 70 4.10 93 2.86 84 1.91 59   01/08/2016 72 3.79 86% 2.74 80% 1.97 61%   12/04/2015 73 3.6 82% 2.62 77% 1.91 59%   11/13/2015 79 3.13 71% 2.48 72% 2.13 66%   10/30/2015 84 3.29 76% 2.76 81% 3.02 95%   10/16/2015 89 3.03 70% 2.71 80% 3.3 103%   09/23/2015 82 3.32 77% 2.73 81% 2.72 85%   09/20/2015 85 3.26 75% 2.78 82% 2.76 87%   Interpretation -  Near normal and stable      IMPRESSION/PLAN:  67 year old man s/p BOLT 09/05/15  for COPD (CMV+/+, EBV+/+)     1. AKI on CKD - Most likely related to recent addition of torsemide 40 daily and immunosuppressants   - We'll hold tonight's FK dose and reduce to 2q12   - Reduce sirolimus to 1.5 daily   - Hold torsemide for now   - Hold mag oxide for now    2. Lung transplant - Doing well symptomatically and spirometrically   - IS - FK (6-8), sirolimus (4-8), pred 5 (off MMF for prior CMV)   - Cont azithro for BOS0p   - PPX - Bactrim and valacyclovir indefinitely   - CT scan 02/2018 to follow the nodules    3. 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    4. Other medical issues/LTx complications, health maintenance   - Statin for CAD (aspirin stopped, now on warfarin)   - CA/Vit D for osteopenia     - Up to date on optho and derm screening    5. Follow up 6 months . He is still trying to decide whether to move to [facility] permanently. I asked him to keep [place] updated regarding any desire to fully transfer care. Either way, will need to establish with a pulmonologist in [facility] for local care.

## 2017-12-09 ENCOUNTER — Other Ambulatory Visit (HOSPITAL_COMMUNITY): Payer: Self-pay

## 2017-12-09 LAB — LUNG TRX, POST OP EXTENDED PANEL
% Basophils: 0 %
% Eosinophils: 1 %
% Immature Granulocytes: 2 %
% Lymphocytes: 30 %
% Monocytes: 13 %
% Neutrophils: 54 %
% Nucleated RBC: 0 %
ALT (GPT): 13 U/L (ref 10–48)
AST (GOT): 20 U/L (ref 9–38)
Absolute Eosinophil Count: 0.05 10*3/uL (ref 0.00–0.50)
Absolute Lymphocyte Count: 1.72 10*3/uL (ref 1.00–4.80)
Albumin: 4.2 g/dL (ref 3.5–5.2)
Alkaline Phosphatase (Total): 69 U/L (ref 36–161)
Anion Gap: 8 (ref 4–12)
Basophils: 0.02 10*3/uL (ref 0.00–0.20)
Bilirubin (Total): 0.3 mg/dL (ref 0.2–1.3)
CMV DNA IU/mL (Log10): 1.7
CMV Quant Result: 55 [IU]/mL — AB
Calcium: 9.3 mg/dL (ref 8.9–10.2)
Carbon Dioxide, Total: 29 meq/L (ref 22–32)
Chloride: 102 meq/L (ref 98–108)
Creatinine: 3.6 mg/dL — ABNORMAL HIGH (ref 0.51–1.18)
GFR, Calc, African American: 21 mL/min/{1.73_m2} — ABNORMAL LOW (ref >59)
GFR, Calc, European American: 17 mL/min/{1.73_m2} — ABNORMAL LOW (ref >59)
Glucose: 101 mg/dL (ref 62–125)
Hematocrit: 33 % — ABNORMAL LOW (ref 38–50)
Hemoglobin: 10.4 g/dL — ABNORMAL LOW (ref 13.0–18.0)
Immature Granulocytes: 0.09 10*3/uL — ABNORMAL HIGH (ref 0.00–0.05)
MCH: 29.9 pg (ref 27.3–33.6)
MCHC: 31.4 g/dL — ABNORMAL LOW (ref 32.2–36.5)
MCV: 95 fL (ref 81–98)
Magnesium: 2.9 mg/dL — ABNORMAL HIGH (ref 1.8–2.4)
Monocytes: 0.76 10*3/uL (ref 0.00–0.80)
Neutrophils: 3.2 10*3/uL (ref 1.80–7.00)
Nucleated RBC: 0 10*3/uL
Phosphate: 3.7 mg/dL (ref 2.5–4.5)
Platelet Count: 205 10*3/uL (ref 150–400)
Potassium: 4.8 meq/L (ref 3.6–5.2)
Protein (Total): 7.3 g/dL (ref 6.0–8.2)
RBC: 3.48 10*6/uL — ABNORMAL LOW (ref 4.40–5.60)
RDW-CV: 15.8 % — ABNORMAL HIGH (ref 11.6–14.4)
Sodium: 139 meq/L (ref 135–145)
Urea Nitrogen: 86 mg/dL — ABNORMAL HIGH (ref 8–21)
WBC: 5.84 10*3/uL (ref 4.3–10.0)

## 2017-12-10 ENCOUNTER — Other Ambulatory Visit (HOSPITAL_BASED_OUTPATIENT_CLINIC_OR_DEPARTMENT_OTHER): Payer: Self-pay | Admitting: Transplant

## 2017-12-10 DIAGNOSIS — Z942 Lung transplant status: Secondary | ICD-10-CM

## 2017-12-10 MED ORDER — PANTOPRAZOLE SODIUM 40 MG OR TBEC
DELAYED_RELEASE_TABLET | ORAL | 11 refills | Status: DC
Start: 2017-12-10 — End: 2018-06-29

## 2017-12-14 ENCOUNTER — Other Ambulatory Visit (HOSPITAL_COMMUNITY): Payer: Self-pay

## 2017-12-14 LAB — BASIC METABOLIC PANEL, EXTERNAL
Calcium, External: 8.5
Carbon Dioxide (Total), External: 22
Chloride, External: 109
Creatinine, External: 2.34
Glucose, External: 86
Potassium, External: 4.7
Sodium, External: 140
Urea Nitrogen, External: 57

## 2017-12-14 LAB — SIROLIMUS, EXTERNAL: Sirolimus, External: 6.3

## 2017-12-14 LAB — TACROLIMUS, EXTERNAL: Tacrolimus, External: 5.7

## 2017-12-27 ENCOUNTER — Other Ambulatory Visit (HOSPITAL_COMMUNITY): Payer: Self-pay

## 2017-12-27 LAB — BASIC METABOLIC PANEL, EXTERNAL
Calcium, External: 8.7
Carbon Dioxide (Total), External: 22
Chloride, External: 109
Creatinine, External: 1.65
Glucose, External: 95
Potassium, External: 5.2
Sodium, External: 137
Urea Nitrogen, External: 34

## 2018-01-06 ENCOUNTER — Other Ambulatory Visit (HOSPITAL_BASED_OUTPATIENT_CLINIC_OR_DEPARTMENT_OTHER): Payer: Self-pay

## 2018-01-06 DIAGNOSIS — Z942 Lung transplant status: Secondary | ICD-10-CM

## 2018-01-06 MED ORDER — SIROLIMUS 0.5 MG OR TABS
0.5000 mg | ORAL_TABLET | Freq: Every morning | ORAL | 11 refills | Status: DC
Start: 2018-01-06 — End: 2018-04-12

## 2018-01-06 MED ORDER — SIROLIMUS 1 MG OR TABS
1.0000 mg | ORAL_TABLET | Freq: Every day | ORAL | 11 refills | Status: DC
Start: 2018-01-06 — End: 2018-04-12

## 2018-01-28 ENCOUNTER — Other Ambulatory Visit (HOSPITAL_COMMUNITY): Payer: Self-pay

## 2018-01-28 LAB — BASIC METABOLIC PANEL, EXTERNAL
Calcium, External: 8.5
Carbon Dioxide (Total), External: 21
Chloride, External: 110
Creatinine, External: 1.65
Glucose, External: 85
Potassium, External: 5.1
Sodium, External: 139
Urea Nitrogen, External: 33

## 2018-01-28 LAB — SIROLIMUS, EXTERNAL: Sirolimus, External: 4.6

## 2018-01-28 LAB — CBC, DIFF, EXTERNAL: Neutrophils, External: 3.286

## 2018-01-28 LAB — CMV_PCR QUANT, EXTERNAL: CMV Quant Result, External: <200

## 2018-01-28 LAB — TACROLIMUS, EXTERNAL: Tacrolimus, External: 5

## 2018-01-28 LAB — CBC (HEMOGRAM), EXTERNAL
Hematocrit, External: 26.8
Platelet Count, External: 197
WBC, External: 5.9

## 2018-03-14 ENCOUNTER — Other Ambulatory Visit (HOSPITAL_COMMUNITY): Payer: Self-pay

## 2018-03-14 LAB — CBC (HEMOGRAM), EXTERNAL
Hematocrit, External: 29.2
Platelet Count, External: 204
WBC, External: 6.1

## 2018-03-14 LAB — BASIC METABOLIC PANEL, EXTERNAL
Calcium, External: 8.5
Carbon Dioxide (Total), External: 19
Chloride, External: 114
Creatinine, External: 1.71
Glucose, External: 84
Potassium, External: 5.3
Sodium, External: 140
Urea Nitrogen, External: 39

## 2018-03-14 LAB — SIROLIMUS, EXTERNAL: Sirolimus, External: 5.9

## 2018-03-14 LAB — TACROLIMUS, EXTERNAL: Tacrolimus, External: 7.5

## 2018-03-14 LAB — CBC, DIFF, EXTERNAL: Neutrophils, External: 3.447

## 2018-04-12 ENCOUNTER — Other Ambulatory Visit (HOSPITAL_BASED_OUTPATIENT_CLINIC_OR_DEPARTMENT_OTHER): Payer: Self-pay | Admitting: Internal Medicine

## 2018-04-12 DIAGNOSIS — Z942 Lung transplant status: Secondary | ICD-10-CM

## 2018-04-12 MED ORDER — SIROLIMUS 0.5 MG OR TABS
0.5000 mg | ORAL_TABLET | Freq: Every morning | ORAL | 11 refills | Status: DC
Start: 2018-04-12 — End: 2018-06-29

## 2018-04-12 MED ORDER — SIROLIMUS 1 MG OR TABS
1.0000 mg | ORAL_TABLET | Freq: Every day | ORAL | 11 refills | Status: DC
Start: 2018-04-12 — End: 2018-06-29

## 2018-04-29 ENCOUNTER — Other Ambulatory Visit (HOSPITAL_COMMUNITY): Payer: Self-pay

## 2018-04-29 LAB — BASIC METABOLIC PANEL, EXTERNAL
Calcium, External: 8.3
Carbon Dioxide (Total), External: 21
Chloride, External: 113
Creatinine, External: 1.3
Glucose, External: 77
Potassium, External: 4.9
Sodium, External: 140
Urea Nitrogen, External: 25

## 2018-04-29 LAB — CBC (HEMOGRAM), EXTERNAL
Hematocrit, External: 31.3
Platelet Count, External: 199
WBC, External: 5.6

## 2018-04-29 LAB — TACROLIMUS, EXTERNAL: Tacrolimus, External: 5.2

## 2018-04-29 LAB — CBC, DIFF, EXTERNAL: Neutrophils, External: 2.89

## 2018-04-29 LAB — SIROLIMUS, EXTERNAL: Sirolimus, External: 5.5

## 2018-05-17 ENCOUNTER — Other Ambulatory Visit (HOSPITAL_BASED_OUTPATIENT_CLINIC_OR_DEPARTMENT_OTHER): Payer: Self-pay | Admitting: Internal Medicine

## 2018-05-17 DIAGNOSIS — Z942 Lung transplant status: Secondary | ICD-10-CM

## 2018-05-17 DIAGNOSIS — D849 Immunodeficiency, unspecified: Secondary | ICD-10-CM

## 2018-05-18 MED ORDER — TACROLIMUS 1 MG OR CAPS
2.0000 mg | ORAL_CAPSULE | Freq: Two times a day (BID) | ORAL | 11 refills | Status: DC
Start: 2018-05-18 — End: 2018-06-29

## 2018-06-03 ENCOUNTER — Other Ambulatory Visit (HOSPITAL_COMMUNITY): Payer: Self-pay

## 2018-06-03 LAB — PULMONARY FUNCTION TESTING
DLCO-%Pred-Pre: 72
DLCO-Pre: 21.4
FEF25-75-%Pred-Pre: 74
FEF25-75-Pre: 1.81
FEV1-%Pred-Pre: 88
FEV1-Pre: 2.73
FEV1/FVC-Pre: 71
FVC-%Pred-Pre: 92
FVC-Pre: 3.86
RV (Pleth)-%Pred-Pre: 119
RV (Pleth)-Pre: 2.72
TLC (Pleth)-%Pred-Pre: 101
TLC (Pleth)-Pre: 6.68

## 2018-06-15 ENCOUNTER — Other Ambulatory Visit (HOSPITAL_COMMUNITY): Payer: Self-pay

## 2018-06-15 LAB — BASIC METABOLIC PANEL, EXTERNAL
Calcium, External: 8.3
Carbon Dioxide (Total), External: 20
Chloride, External: 111
Creatinine, External: 1.24
Glucose, External: 89
Potassium, External: 5.4
Sodium, External: 136
Urea Nitrogen, External: 32

## 2018-06-15 LAB — SIROLIMUS, EXTERNAL: Sirolimus, External: 16

## 2018-06-15 LAB — CBC (HEMOGRAM), EXTERNAL
Hematocrit, External: 30.8
Platelet Count, External: 202
WBC, External: 5.6

## 2018-06-15 LAB — TACROLIMUS, EXTERNAL: Tacrolimus, External: 7.9

## 2018-06-15 LAB — CBC, DIFF, EXTERNAL: Neutrophils, External: 2.86

## 2018-06-23 ENCOUNTER — Other Ambulatory Visit (HOSPITAL_COMMUNITY): Payer: Self-pay

## 2018-06-23 LAB — BASIC METABOLIC PANEL, EXTERNAL
Calcium, External: 8.8
Carbon Dioxide (Total), External: 23
Chloride, External: 110
Creatinine, External: 1.19
Glucose, External: 81
Potassium, External: 5.8
Sodium, External: 139
Urea Nitrogen, External: 26

## 2018-06-23 LAB — SIROLIMUS, EXTERNAL: Sirolimus, External: 6.7

## 2018-06-29 ENCOUNTER — Other Ambulatory Visit (HOSPITAL_BASED_OUTPATIENT_CLINIC_OR_DEPARTMENT_OTHER): Payer: Self-pay

## 2018-06-29 DIAGNOSIS — Z942 Lung transplant status: Secondary | ICD-10-CM

## 2018-06-29 DIAGNOSIS — D849 Immunodeficiency, unspecified: Secondary | ICD-10-CM

## 2018-06-29 MED ORDER — SIROLIMUS 0.5 MG OR TABS
0.5000 mg | ORAL_TABLET | Freq: Every morning | ORAL | 11 refills | Status: AC
Start: 2018-06-29 — End: ?

## 2018-06-29 MED ORDER — SULFAMETHOXAZOLE-TRIMETHOPRIM 400-80 MG OR TABS
1.0000 | ORAL_TABLET | Freq: Every day | ORAL | 11 refills | Status: DC
Start: 2018-06-29 — End: 2019-07-10

## 2018-06-29 MED ORDER — FLUCONAZOLE 200 MG OR TABS
200.0000 mg | ORAL_TABLET | Freq: Every day | ORAL | 3 refills | Status: AC
Start: 2018-06-29 — End: ?

## 2018-06-29 MED ORDER — TACROLIMUS 0.5 MG OR CAPS
0.5000 mg | ORAL_CAPSULE | Freq: Every morning | ORAL | 11 refills | Status: DC
Start: 2018-06-29 — End: 2018-08-31

## 2018-06-29 MED ORDER — ATORVASTATIN CALCIUM 20 MG OR TABS
20.0000 mg | ORAL_TABLET | Freq: Every day | ORAL | 11 refills | Status: DC
Start: 2018-06-29 — End: 2018-11-30

## 2018-06-29 MED ORDER — PREDNISONE 5 MG OR TABS
5.0000 mg | ORAL_TABLET | Freq: Every day | ORAL | 11 refills | Status: AC
Start: 2018-06-29 — End: ?

## 2018-06-29 MED ORDER — AZITHROMYCIN 250 MG OR TABS
ORAL_TABLET | ORAL | 11 refills | Status: DC
Start: 2018-06-29 — End: 2018-12-15

## 2018-06-29 MED ORDER — PANTOPRAZOLE SODIUM 40 MG OR TBEC
40.0000 mg | DELAYED_RELEASE_TABLET | Freq: Every day | ORAL | 11 refills | Status: AC
Start: 2018-06-29 — End: ?

## 2018-06-29 MED ORDER — TACROLIMUS 1 MG OR CAPS
2.0000 mg | ORAL_CAPSULE | Freq: Two times a day (BID) | ORAL | 11 refills | Status: DC
Start: 2018-06-29 — End: 2019-06-01

## 2018-06-29 MED ORDER — SIROLIMUS 1 MG OR TABS
1.0000 mg | ORAL_TABLET | Freq: Every day | ORAL | 11 refills | Status: DC
Start: 2018-06-29 — End: 2019-06-01

## 2018-08-11 ENCOUNTER — Other Ambulatory Visit (HOSPITAL_COMMUNITY): Payer: Self-pay

## 2018-08-11 LAB — SIROLIMUS, EXTERNAL: Sirolimus, External: 6.2

## 2018-08-11 LAB — BASIC METABOLIC PANEL, EXTERNAL
Calcium, External: 8.9
Carbon Dioxide (Total), External: 19
Chloride, External: 114
Creatinine, External: 1.68
Glucose, External: 106
Potassium, External: 5.3
Sodium, External: 142
Urea Nitrogen, External: 34

## 2018-08-11 LAB — CBC (HEMOGRAM), EXTERNAL
Hematocrit, External: 32.5
Platelet Count, External: 225
WBC, External: 6.7

## 2018-08-11 LAB — CBC, DIFF, EXTERNAL: Neutrophils, External: 3.524

## 2018-08-11 LAB — TACROLIMUS, EXTERNAL: Tacrolimus, External: 5.2

## 2018-08-31 ENCOUNTER — Other Ambulatory Visit (HOSPITAL_BASED_OUTPATIENT_CLINIC_OR_DEPARTMENT_OTHER): Payer: Self-pay | Admitting: Transplant

## 2018-08-31 DIAGNOSIS — Z942 Lung transplant status: Secondary | ICD-10-CM

## 2018-08-31 DIAGNOSIS — D849 Immunodeficiency, unspecified: Secondary | ICD-10-CM

## 2018-08-31 MED ORDER — TACROLIMUS 0.5 MG OR CAPS
ORAL_CAPSULE | ORAL | 11 refills | Status: DC
Start: 2018-08-31 — End: 2018-12-21

## 2018-10-03 ENCOUNTER — Other Ambulatory Visit (HOSPITAL_COMMUNITY): Payer: Self-pay

## 2018-10-03 LAB — BASIC METABOLIC PANEL, EXTERNAL
Calcium, External: 8.7
Carbon Dioxide (Total), External: 20
Chloride, External: 112
Creatinine, External: 1.65
Glucose, External: 93
Potassium, External: 5.5
Sodium, External: 138
Urea Nitrogen, External: 31

## 2018-10-03 LAB — CBC (HEMOGRAM), EXTERNAL
Hematocrit, External: 28.6
Hemoglobin, External: 9.4
Platelet Count, External: 208
WBC, External: 5.6

## 2018-10-03 LAB — SIROLIMUS, EXTERNAL: Sirolimus, External: 7.2

## 2018-10-03 LAB — TACROLIMUS, EXTERNAL: Tacrolimus, External: 8

## 2018-10-03 LAB — CBC, DIFF, EXTERNAL: Neutrophils, External: 2.77

## 2018-11-15 ENCOUNTER — Encounter (HOSPITAL_BASED_OUTPATIENT_CLINIC_OR_DEPARTMENT_OTHER): Payer: Self-pay | Admitting: Unknown Physician Specialty

## 2018-11-16 ENCOUNTER — Encounter (HOSPITAL_BASED_OUTPATIENT_CLINIC_OR_DEPARTMENT_OTHER): Payer: Self-pay | Admitting: Unknown Physician Specialty

## 2018-11-21 ENCOUNTER — Encounter (HOSPITAL_BASED_OUTPATIENT_CLINIC_OR_DEPARTMENT_OTHER): Payer: Medicare Other

## 2018-11-28 ENCOUNTER — Other Ambulatory Visit (HOSPITAL_COMMUNITY): Payer: Self-pay

## 2018-11-28 LAB — BASIC METABOLIC PANEL, EXTERNAL
Calcium, External: 8.7
Carbon Dioxide (Total), External: 17
Chloride, External: 111
Creatinine, External: 1.85
Glucose, External: 94
Potassium, External: 5.6
Sodium, External: 136
Urea Nitrogen, External: 37

## 2018-11-28 LAB — SIROLIMUS, EXTERNAL: Sirolimus, External: 7.3

## 2018-11-28 LAB — PHOSPHATE, EXTERNAL: Phosphate, External: 3.4

## 2018-11-28 LAB — CBC, DIFF, EXTERNAL: Neutrophils, External: 3.62

## 2018-11-28 LAB — CBC (HEMOGRAM), EXTERNAL
Hematocrit, External: 32.4
Hemoglobin, External: 9.9
Platelet Count, External: 199
WBC, External: 6.1

## 2018-11-28 LAB — TACROLIMUS, EXTERNAL: Tacrolimus, External: 7.8

## 2018-11-28 LAB — MAGNESIUM, EXTERNAL: Magnesium, External: 2.2

## 2018-11-28 LAB — CMV_PCR QUANT, EXTERNAL: CMV Quant Result, External: NEGATIVE

## 2018-11-30 ENCOUNTER — Ambulatory Visit: Payer: Medicare Other | Admitting: Transplant

## 2018-11-30 ENCOUNTER — Other Ambulatory Visit (HOSPITAL_COMMUNITY): Payer: Self-pay

## 2018-11-30 ENCOUNTER — Encounter (HOSPITAL_BASED_OUTPATIENT_CLINIC_OR_DEPARTMENT_OTHER): Payer: Self-pay | Admitting: Transplant

## 2018-11-30 ENCOUNTER — Encounter (HOSPITAL_BASED_OUTPATIENT_CLINIC_OR_DEPARTMENT_OTHER): Payer: Self-pay

## 2018-11-30 DIAGNOSIS — D899 Disorder involving the immune mechanism, unspecified: Secondary | ICD-10-CM

## 2018-11-30 DIAGNOSIS — D849 Immunodeficiency, unspecified: Secondary | ICD-10-CM

## 2018-11-30 DIAGNOSIS — J42 Unspecified chronic bronchitis: Secondary | ICD-10-CM

## 2018-11-30 DIAGNOSIS — R042 Hemoptysis: Secondary | ICD-10-CM

## 2018-11-30 DIAGNOSIS — J301 Allergic rhinitis due to pollen: Secondary | ICD-10-CM | POA: Insufficient documentation

## 2018-11-30 DIAGNOSIS — R918 Other nonspecific abnormal finding of lung field: Secondary | ICD-10-CM

## 2018-11-30 DIAGNOSIS — Z942 Lung transplant status: Secondary | ICD-10-CM

## 2018-11-30 NOTE — Progress Notes (Addendum)
Dakota Marsh PULMONARY POST-TRANSPLANT VISIT    This visit is being conducted over the telephone at the patient's request: Yes  Patient gives verbal consent to proceed and knows there may be a copay/deductible: Yes  Time spent with patient/guardian on this telephone visit: 30 minutes  Given the importance of social distancing and other strategies recommended to reduce the risk of COVID-19 transmission, I am providing medical care to this patient via a telephone visit in place of an in person visit at the request of the patient.    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 He was last seen in clinic 12/08/2017. At that visit he was doing well from a pulmonary standpoint but had had a severe AKI with Cr 3.6 due to recent addition of torsemide and a supratherapeutic FK level. The torsemide was stopped, FK adjusted, and Cr improved, with now stable CKD with baseline Cr~1.6     After that visit he spent some time in [place]. He had considered and was followed by some medical providers there, but more recently decided to move back to [place]. Says he prefers it here, missed the weather and seasons. No staying in an apartment near [facility] but may soon be moving to a 5th wheel trailer. He has been feeling very well for the most part. A few issues including nasal congestion/allergy symptoms since moving back, which he attributes to the change in weather. Needs to blow his nose each morning, and over past week has had some bloody mucus from his nose. With this he has also had a mild cough over past 1-2 weeks, and once coughed out ~1/2 tsp of dark clotted blood which he believes is related to the nasal symptoms as he felt it in his throat just prior. On warfarin and asa, no recent INR, plans on following up with new PCP this week. Other than that has been doing very well from a  pulmonary standpoint and denies SOB, exercise intolerance, f/c/ns, weight changes. Not checking home spirometry as lost his device. 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 Otherwise, also denies GERD, n/v/abd pain, diarrhea, constipation, LE edema, skin changes, vision problems.    ROS  - Dry eyes, using artificial tears  - R shoulder stiffness/pain, takes tramadol PRN  - Post-herpetic neuralgia pain stable, takes gabapenti  A complete ROS was otherwise negative or as per hpi     PMH:  1. 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 GERD (DeMeester 7.0 06/2015, Shatzi ring s/p dilation 11/2014)  3. HTN  4. CAD (LHC 06/2015 with 50% distal RCA stenosis)  5. Depression/anxiety  6. Osteoporosis - T score -2.0 on DEXA 05/2015   - Repeat 05/2017 with T score -2.1  7. DVTs   - First R sided 10/2016, felt to be provoked following ankle injury,  completed 3 months warfarin   - LLE 05/2017, now indefinite warfarin  8. 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  Acetaminophen 325 MG Oral Tab, Take 650 mg by mouth every 6 hours as needed for pain., Disp: , Rfl:   .  Aspirin 81 MG Oral Tab, Take 81 mg by mouth daily., Disp: , Rfl:   .  atorvastatin 20 MG tablet, Take 1 tablet (20 mg) by mouth daily., Disp: 30 tablet, Rfl: 11  .  azithromycin 250 MG tablet, Take one  tablet daily for five days, then three times a week on Monday , Wednesday and Friday., Disp: 15 tablet, Rfl: 11  .  Cholecalciferol 1000 units Oral Tab, Take 1,000 Units by mouth daily., Disp: , Rfl:   .  fluconazole 200 MG tablet, Take 1 tablet (200 mg) by mouth daily., Disp: 90 tablet, Rfl: 3  .  Gabapentin 300 MG Oral Cap, Take 3 capsules (900 mg) by mouth 3 times a day. (Patient taking differently: Take 600 mg by mouth 3 times a day. ), Disp: 270 capsule, Rfl: 0  .  Magnesium Oxide 400 MG Oral Tab, Take 400 mg by mouth 2 times a day., Disp: , Rfl:   .  pantoprazole 40 MG EC tablet, Take 1 tablet (40 mg) by mouth daily., Disp: 30 tablet, Rfl: 11  .  predniSONE 5 MG tablet, Take 1 tablet (5 mg) by mouth daily., Disp: 30 tablet, Rfl: 11  .  sirolimus 0.5 MG tablet, Take 1 tablet (0.5 mg) by mouth every morning. Total dose now 1.5 mg every morning, Disp: 30 tablet, Rfl: 11  .  sirolimus 1 MG tablet, Take 1 tablet (1 mg) by mouth daily. Or as directed by provider., Disp: 30 tablet, Rfl: 11  .  sulfamethoxazole-trimethoprim 400-80 MG tablet, Take 1 tablet by mouth daily., Disp: 30 tablet, Rfl: 11  .  TACROLIMUS 0.5 MG capsule, TAKE ONE CAPSULE BY MOUTH EVERY MORNING, Disp: 30 capsule, Rfl: 11  .  tacrolimus 1 MG capsule, Take 2 capsules (2 mg) by mouth every 12 hours. Patient may have up to a 90 day supply plus one year refills if allowed., Disp: 120 capsule, Rfl: 11  .  Tacrolimus 1 MG Oral Cap, Take 3 capsules (3 mg) by mouth every 12 hours. (Patient taking differently: Take by mouth every 12 hours. 2 mg in the morning and 3 mg in the evening), Disp: 180 capsule, Rfl: 11  .  ValACYclovir HCl 1 g Oral Tab, Take 1 g by mouth daily., Disp: , Rfl:   .  WARFARIN SODIUM OR, , Disp: , Rfl:     ALLERGIES:  Codeine - anxiety/dyspnea    SH  Home was [facility] was staying in [place], now moved back to [place] near [place]. 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    PHYSICAL  EXAM  Not performed    DATA:    Labs   All were reviewed, most recently -   Cr 1.8, K 5.6  hct 321    Most recent CT personally reviewed and showed:  "Stable to mildly enlarged bilateral upper lobe nodules. New 11 mm left lower lobe nodule concerning for fungal infection. 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."  Spirometry R FVC   %     FEV1    %    2575     %  12/08/17 67 4.34 100 2.92 86 1.67 53   06/09/17 65 4.36 100 2.84 83 1.47 46   02/17/17 66 4.34 99 2.86 84 1.60 50   10/28/2016 68 4.29 98 2.92 86 1.73 55   08/31/16 66 4.07 93 2.68 79 1.47 46   05/13/16 74 3.77 86 2.79 81 2.16 67   02/26/16 70 4.10 93 2.86 84 1.91 59   01/08/2016 72 3.79 86% 2.74 80% 1.97 61%   12/04/2015 73 3.6 82% 2.62 77% 1.91 59%   11/13/2015 79 3.13 71% 2.48 72% 2.13 66%   10/30/2015 84 3.29 76% 2.76 81% 3.02 95%   10/16/2015 89 3.03 70% 2.71 80% 3.3 103%   09/23/2015 82 3.32 77% 2.73 81% 2.72 85%   09/20/2015 85 3.26 75% 2.78 82% 2.76 87%       IMPRESSION/PLAN:  68 year old man s/p BOLT 09/05/15 for COPD (CMV+/+, EBV+/+)     1. Lung transplant, pulmonary nodules - No recent spirometry but doing well symptomatically    - IS - FK (6-8), sirolimus (4-8), pred 5 (off MMF for prior CMV)   - He ran out of azithro recently and has not been taking. He likely does have very mild BOS based on CT and mid-flows, so will restart at 250 TIW   - PPX - Bactrim and valacyclovir indefinitely   - Will start scheduled nasal fluticasone and PRN loratadine for allergic rhinitis. Monitor nasal symptoms especially epistaxis and one episode hemoptysis (likely related to epistaxis). Follow up with new PCP this week for INR check (warfarin for recurrent DVTs), particularly with restarting azithro.    - Will see if we can get formal spirometry in near future. Encouraged home spirometry particularly with testing difficulties posed by COVID 19 pandemic.   - Overdue for CT to follow up pulmonary nodules    2. Recent  AKI on CKD, hyperkalemia - FK and TMP/SMX are contributing.    - We adjusted his FK dose and this should help. Low K diet.    - Recheck labs this week. If K continues to be an issue could consider substitute for TMP/SMX   - No longer on mag supplementation. Mag level is normal.     3. Other medical issues/LTx complications, health maintenance  - Annual opthalmologic evaluation for steroid-induced complications  - Due to significantly increased risk of skin cancer after transplant (and history of multiple SCCs) will need regular dermatologic screening  - Other age-appropriate cancer screening per PCP (note unexplained Fe deficiency, consider endoscopic evaluation)  - Monitoring of bone density and cardiovascular risk modification (ie, lipids, BP, blood glucose/DM) per PCP    4. Follow up ~3 months        ADDENDUM 12/07/18  Following the visit I did discuss plan of care with his PCP Cyndie Chime. She will prescribe his warfarin and monitor INR, epistaxis, potential interaction upon starting azithro. She will also keep an eye on the renal function and hyperK. I also reviewed recommendations for derm and optho follow up, along with bone density monitoring (and treatment if indicated).    We also received some records from [facility]. He had a CT 06/28/2018 which showed (by report) several non-calcified nodules (up to 35mm) bilaterally and interval resolution of 4mm pulmonary nodule R apex seen on CXR. They recommended following up in 6 months.  Full PFTs 05/2018 are as follows:    R FVC  FEV1  FEF25-75 TLC  RV  DLCO  06/03/2018 71  3.86/92% 2.73/88% 1.81/74% 6.68 /101% 2.72/119% 21.4/72%    Will see if we can get the CT and follow up spirometry in the near future. We adjusted his FK and we'll follow the AKI/hyperK soon.

## 2018-12-08 ENCOUNTER — Encounter (HOSPITAL_BASED_OUTPATIENT_CLINIC_OR_DEPARTMENT_OTHER): Payer: Self-pay | Admitting: Unknown Physician Specialty

## 2018-12-13 ENCOUNTER — Other Ambulatory Visit (HOSPITAL_COMMUNITY): Payer: Self-pay

## 2018-12-13 LAB — PULMONARY FUNCTION TESTING
FEF25-75-%Pred-Pre: 43
FEF25-75-Pre: 1.02
FEV1-%Pred-Pre: 86
FEV1-Pre: 2.63
FEV1/FVC-Pre: 62
FVC-%Pred-Pre: 103
FVC-Pre: 4.26

## 2018-12-15 ENCOUNTER — Other Ambulatory Visit (HOSPITAL_BASED_OUTPATIENT_CLINIC_OR_DEPARTMENT_OTHER): Payer: Self-pay

## 2018-12-15 DIAGNOSIS — Z942 Lung transplant status: Secondary | ICD-10-CM

## 2018-12-16 ENCOUNTER — Other Ambulatory Visit (HOSPITAL_COMMUNITY): Payer: Self-pay

## 2018-12-16 LAB — BASIC METABOLIC PANEL, EXTERNAL
Calcium, External: 8.7
Carbon Dioxide (Total), External: 18
Chloride, External: 113
Creatinine, External: 1.6
Glucose, External: 110
Potassium, External: 5.7
Sodium, External: 137
Urea Nitrogen, External: 38

## 2018-12-16 LAB — TACROLIMUS, EXTERNAL: Tacrolimus, External: 10.4

## 2018-12-16 MED ORDER — AZITHROMYCIN 250 MG OR TABS
250.0000 mg | ORAL_TABLET | Freq: Every day | ORAL | 11 refills | Status: AC
Start: 2018-12-16 — End: ?

## 2018-12-21 ENCOUNTER — Other Ambulatory Visit (HOSPITAL_COMMUNITY): Payer: Self-pay

## 2018-12-21 ENCOUNTER — Other Ambulatory Visit (HOSPITAL_BASED_OUTPATIENT_CLINIC_OR_DEPARTMENT_OTHER): Payer: Self-pay

## 2018-12-21 DIAGNOSIS — Z942 Lung transplant status: Secondary | ICD-10-CM

## 2018-12-21 DIAGNOSIS — D849 Immunodeficiency, unspecified: Secondary | ICD-10-CM

## 2018-12-21 MED ORDER — TACROLIMUS 0.5 MG OR CAPS
0.5000 mg | ORAL_CAPSULE | Freq: Every evening | ORAL | 3 refills | Status: DC
Start: 2018-12-21 — End: 2019-06-01

## 2018-12-30 ENCOUNTER — Other Ambulatory Visit (HOSPITAL_COMMUNITY): Payer: Self-pay

## 2018-12-30 LAB — BASIC METABOLIC PANEL, EXTERNAL
Calcium, External: 8.9
Carbon Dioxide (Total), External: 22
Chloride, External: 110
Creatinine, External: 1.33
Glucose, External: 98
Potassium, External: 5.7
Sodium, External: 138
Urea Nitrogen, External: 39

## 2018-12-30 LAB — TACROLIMUS, EXTERNAL: Tacrolimus, External: 6.5

## 2019-02-01 ENCOUNTER — Other Ambulatory Visit (HOSPITAL_COMMUNITY): Payer: Self-pay

## 2019-02-01 LAB — BASIC METABOLIC PANEL, EXTERNAL
Calcium, External: 8.8
Carbon Dioxide (Total), External: 19
Chloride, External: 110
Creatinine, External: 2.08
Glucose, External: 112
Sodium, External: 135
Urea Nitrogen, External: 34

## 2019-02-01 LAB — CBC (HEMOGRAM), EXTERNAL
Hematocrit, External: 31.3
Platelet Count, External: 309
WBC, External: 5.7

## 2019-02-01 LAB — TACROLIMUS, EXTERNAL: Tacrolimus, External: 7.1

## 2019-02-01 LAB — SIROLIMUS, EXTERNAL: Sirolimus, External: 7.6

## 2019-02-03 ENCOUNTER — Inpatient Hospital Stay: Payer: Self-pay

## 2019-02-03 ENCOUNTER — Other Ambulatory Visit (HOSPITAL_COMMUNITY): Payer: Self-pay

## 2019-02-03 ENCOUNTER — Other Ambulatory Visit (HOSPITAL_BASED_OUTPATIENT_CLINIC_OR_DEPARTMENT_OTHER): Payer: Self-pay

## 2019-02-03 DIAGNOSIS — Z942 Lung transplant status: Secondary | ICD-10-CM

## 2019-02-03 LAB — BASIC METABOLIC PANEL, EXTERNAL
Calcium, External: 9.1
Carbon Dioxide (Total), External: 18
Chloride, External: 109
Creatinine, External: 2.32
Glucose, External: 114
Potassium, External: 6.4
Sodium, External: 135
Urea Nitrogen, External: 36

## 2019-02-03 MED ORDER — SODIUM POLYSTYRENE SULFONATE 15 GM/60ML OR SUSP
15.0000 g | Freq: Once | ORAL | 1 refills | Status: DC | PRN
Start: 2019-02-03 — End: 2019-03-20

## 2019-02-03 MED ORDER — SODIUM POLYSTYRENE SULFONATE 15 GM/60ML OR SUSP
15.0000 g | Freq: Once | ORAL | 1 refills | Status: DC | PRN
Start: 2019-02-03 — End: 2019-02-03

## 2019-02-08 ENCOUNTER — Other Ambulatory Visit (HOSPITAL_COMMUNITY): Payer: Self-pay

## 2019-02-08 LAB — BASIC METABOLIC PANEL, EXTERNAL
Calcium, External: 8.2
Carbon Dioxide (Total), External: 22
Chloride, External: 107
Creatinine, External: 1.75
Glucose, External: 95
Potassium, External: 4.5
Sodium, External: 136
Urea Nitrogen, External: 35

## 2019-02-08 LAB — TACROLIMUS, EXTERNAL: Tacrolimus, External: 4.5

## 2019-02-08 LAB — SIROLIMUS, EXTERNAL: Sirolimus, External: 7.3

## 2019-02-10 ENCOUNTER — Other Ambulatory Visit (HOSPITAL_COMMUNITY): Payer: Self-pay

## 2019-02-17 ENCOUNTER — Other Ambulatory Visit (HOSPITAL_COMMUNITY): Payer: Self-pay

## 2019-02-17 LAB — BASIC METABOLIC PANEL, EXTERNAL
Carbon Dioxide (Total), External: 21
Chloride, External: 109
Creatinine, External: 1.55
Glucose, External: 109
Potassium, External: 5.5
Sodium, External: 139
Urea Nitrogen, External: 31

## 2019-02-17 LAB — PHOSPHATE, EXTERNAL: Phosphate, External: 9.2

## 2019-02-23 ENCOUNTER — Other Ambulatory Visit (HOSPITAL_COMMUNITY): Payer: Self-pay

## 2019-02-23 LAB — BASIC METABOLIC PANEL, EXTERNAL
Calcium, External: 8.9
Carbon Dioxide (Total), External: 18
Chloride, External: 114
Creatinine, External: 1.7
Glucose, External: 110
Potassium, External: 5.6
Sodium, External: 140
Urea Nitrogen, External: 35

## 2019-03-15 ENCOUNTER — Other Ambulatory Visit (HOSPITAL_COMMUNITY): Payer: Self-pay

## 2019-03-15 LAB — CBC, DIFF, EXTERNAL: Neutrophils, External: 2.37

## 2019-03-15 LAB — HEPATIC FUNCTION PANEL, EXTERNAL
ALT (GPT), External: 10
AST (GOT), External: 19
Albumin, External: 3.9
Alkaline Phosphatase (Total), External: 69
Bilirubin (Direct), External: 0.1
Bilirubin (Total), External: 0.3
Protein (Total), External: 6.3

## 2019-03-15 LAB — CBC (HEMOGRAM), EXTERNAL
Hematocrit, External: 31.6
Platelet Count, External: 204
WBC, External: 5

## 2019-03-15 LAB — BASIC METABOLIC PANEL, EXTERNAL
Calcium, External: 8.4
Carbon Dioxide (Total), External: 20
Chloride, External: 111
Creatinine, External: 1.55
Glucose, External: 89
Potassium, External: 5.4
Sodium, External: 136
Urea Nitrogen, External: 37

## 2019-03-15 LAB — TACROLIMUS, EXTERNAL: Tacrolimus, External: 5.6

## 2019-03-15 LAB — CMV_PCR QUANT, EXTERNAL: CMV Quant Result, External: NEGATIVE

## 2019-03-15 LAB — SIROLIMUS, EXTERNAL: Sirolimus, External: 5.9

## 2019-03-20 ENCOUNTER — Ambulatory Visit: Payer: Medicare Other | Attending: Transplant | Admitting: Transplant

## 2019-03-20 DIAGNOSIS — Z942 Lung transplant status: Secondary | ICD-10-CM | POA: Insufficient documentation

## 2019-03-20 DIAGNOSIS — J42 Unspecified chronic bronchitis: Secondary | ICD-10-CM | POA: Insufficient documentation

## 2019-03-20 DIAGNOSIS — D849 Immunodeficiency, unspecified: Secondary | ICD-10-CM

## 2019-03-20 DIAGNOSIS — R942 Abnormal results of pulmonary function studies: Secondary | ICD-10-CM | POA: Insufficient documentation

## 2019-03-20 DIAGNOSIS — R918 Other nonspecific abnormal finding of lung field: Secondary | ICD-10-CM | POA: Insufficient documentation

## 2019-03-20 DIAGNOSIS — D899 Disorder involving the immune mechanism, unspecified: Secondary | ICD-10-CM | POA: Insufficient documentation

## 2019-03-20 DIAGNOSIS — H6192 Disorder of left external ear, unspecified: Secondary | ICD-10-CM | POA: Insufficient documentation

## 2019-03-20 NOTE — Progress Notes (Signed)
[facility] PULMONARY POST-TRANSPLANT VISIT    This visit is being conducted over the telephone at the patient's request: Yes  Patient gives verbal consent to proceed and knows there may be a copay/deductible: Yes  Time spent with patient/guardian on this telephone visit: 30 minutes  Given the importance of social distancing and other strategies recommended to reduce the risk of COVID-19 transmission, I am providing medical care to this patient via a telephone visit in place of an in person visit at the request of the patient.      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 His last (telephone) visit was 11/30/18 at which time he reported some nasal/allergy symptoms as well as epistaxis and even coughing up some dark blood related to a supratherapeutic INR (which has been adjusted by his PCP). Aspirin was also stopped, and the symptoms have completely resolved. He did have spirometry after the last visit that was slightly down in an obstructive pattern, and CT showing mild BOS changes (and stable nodules), for which we restarted azithro after he had self-DC'd this previously. He reports doing well from a pulmonary standpoint and denies SOB, cough/sputum, exercise intolerance, f/c/ns. 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 He has lost more weight, down to 158# which he feels is due to being so active and low appetite.     Major issues since the last visit relate to his skin. He was diagnosed with a skin cancer of the scalp ~2 months ago that he reports was resected. He began applying a topical agent as directed by his dermatologist but soon thereafter (~1 month ago) developed a lump behind the L ear. He relates this to the topical agent although perhaps in a slightly different location. The lump has grown, now marble  sized, and seems to be associated with some L sided hearing loss and sense of dysequilibrium. He underwent a biopsy ~2 weeks ago but is waiting to hear back re-results. No HA or other focal neuro symptoms. Otherwise, also denies GERD, n/v/abd pain, diarrhea, constipation, LE edema.    ROS  - Dry eyes, using artificial tears, due for optho follow up  - R shoulder stiffness/pain, stable and controlled with tramadol PRN  - Post-herpetic neuralgia pain stable, takes gabapentin  A complete ROS was otherwise negative or as per hpi     PMH:  1. 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 GERD ([place] 7.0 06/2015, Shatzi ring s/p dilation 11/2014)  3. HTN  4. CAD (LHC 06/2015 with 50% distal RCA stenosis)  5. Depression/anxiety  6. Osteoporosis - T score -2.0 on DEXA 05/2015   - Repeat 05/2017 with T score -2.1  7. 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 Cataracts sp-surgery 01/2017  10. SCCs skin including recent issues as above  11. Fe deficiency diagnosed 2019, no longer on replacement, unclear etiology      Current Outpatient Medications:   .  Acetaminophen 325 MG Oral Tab, Take 650 mg by mouth every 6 hours as needed for pain., Disp: , Rfl:   .  Aspirin 81 MG Oral Tab, Take 81 mg by mouth daily., Disp: , Rfl:   .  atorvastatin 10 MG tablet, Take 10 mg by mouth daily., Disp: , Rfl:   .  azithromycin 250 MG tablet, Take 1 tablet (250  mg) by mouth daily., Disp: 30 tablet, Rfl: 11  .  calcium 500 MG tablet, Take 500 mg by mouth daily., Disp: , Rfl:   .  Cholecalciferol 1000 units Oral Tab, Take 1,000 Units by mouth daily., Disp: , Rfl:   .  Ferrous Sulfate (iron) 325 (65 Fe) MG tablet, Take 325 mg by mouth., Disp: , Rfl:   .  fluconazole 200 MG tablet, Take 1 tablet (200 mg) by mouth daily., Disp: 90 tablet, Rfl: 3  .  Gabapentin 300 MG Oral Cap, Take 3 capsules (900 mg) by mouth 3 times a day. (Patient taking differently: Take 600 mg by mouth 3 times a day. ), Disp: 270 capsule, Rfl: 0  .  pantoprazole 40 MG EC tablet, Take 1 tablet (40 mg) by mouth daily., Disp: 30 tablet, Rfl: 11  .  predniSONE 5 MG tablet, Take 1 tablet (5 mg) by mouth daily., Disp: 30 tablet, Rfl: 11  .  sirolimus 0.5 MG tablet, Take 1 tablet (0.5 mg) by mouth every morning. Total dose now 1.5 mg every morning, Disp: 30 tablet, Rfl: 11  .  sirolimus 1 MG tablet, Take 1 tablet (1 mg) by mouth daily. Or as directed by provider., Disp: 30 tablet, Rfl: 11  .  sodium polystyrene 15 GM/60ML oral suspension, Take 60 mL (15 g) by mouth one time as needed for other for up to 1 dose., Disp: 1 bottle, Rfl: 1  .  sulfamethoxazole-trimethoprim 400-80 MG tablet, Take 1 tablet by mouth daily., Disp: 30 tablet, Rfl: 11  .  tacrolimus 0.5 MG capsule, Take 1 capsule (0.5 mg) by mouth every evening., Disp: 90 capsule, Rfl: 3  .  tacrolimus 1 MG capsule, Take 2 capsules (2 mg) by mouth every 12 hours. Patient may have up to a 90 day supply plus one year refills if allowed., Disp: 120 capsule, Rfl: 11  .  Tacrolimus 1 MG Oral Cap, Take 3 capsules (3 mg) by mouth every 12 hours. (Patient taking differently: Take by mouth every 12 hours. 2 mg in the morning and 3 mg in the evening), Disp: 180 capsule, Rfl: 11  .  traMADol 50 MG tablet, Take 50 mg by mouth every 12 hours as needed for moderate pain or severe pain., Disp: , Rfl:   .  ValACYclovir HCl 1 g Oral Tab, Take 1 g by mouth daily., Disp: ,  Rfl:   .  WARFARIN SODIUM OR, , Disp: , Rfl:     ALLERGIES:  Codeine - anxiety/dyspnea    SH  Home was [facility], then was staying in [place], now moved back to [place] in [place] in a 5th wheel trailer. 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    PHYSICAL EXAM  Not performed    DATA:    Labs   All  were reviewed, most recently -   Cr 1.55  K 5.4  hct 31.6    Most recent CT personally reviewed and showed stable pulmonary nodules    Spirometry      R   FVC  %     FEV1   %      2575      %  12/13/2018 External  62  4.26  103% 2.63  86% 1.02  43%              06/03/2018 External  71  3.86  92% 2.73  88% 1.81  74%   12/08/2017 [place]  67  4.34  100% 2.92  86% 1.67  53%              06/09/2017 [place]  65  4.36  100% 2.84  83% 1.47  46%              02/17/2017 [place]  66  4.34  99% 2.86  84% 1.6  50%              10/28/2016 [place]  68  4.29  98% 2.92  86% 1.73  55%              10/02/2016 [place]  70  3.89  88% 2.71  83% 1.67  64%              08/31/2016 [place]  66  4.07  93% 2.68  79% 1.47  46%              05/13/2016 [place]  74  3.77  86% 2.79  81% 2.16  67%              02/26/2016 [place]  70  4.1  93% 2.86  84% 1.91  59%                  IMPRESSION/PLAN:  68 year old man s/p BOLT 09/05/15 for COPD (CMV+/+, EBV+/+)     1. Lung transplant status - Symptomatically doing well from a pulmonary standpoint   - Likely does have BOS with normal but slightly declining spiro over past year. We've restarted azithro at 250 daily and he's due for follow up spirometry now.   - Pulmonary nodules most recently stable, no need for follow up   - IS - FK (4-6), sirolimus (4-8), pred 5 (off MMF for prior CMV)   - PPX - Bactrim and valacyclovir indefinitely   - Nasal fluticasone and PRN loratadine for allergic rhinitis   - Monitor weight loss (also see below)    2. Recent AKI on CKD, hyperkalemia - FK and TMP/SMX are contributing   - Values have improved with reduced FK goal, reduced bactrim dosing, and low K diet   - Will  continue to monitor    3. Other medical issues/LTx complications, health maintenance   - Weight loss will need to be monitored. Age appropriate cancer screening (including skin-see below) per PCP. Also note Fe deficiency of unclear etiology, could consider endoscopic evaluation if not already done.   - Skin cancer issues followed by Dr. Ardelle Anton. I suggested that Mr. Bartolomei to call their office as soon as possible to review biopsy results and management plan   - Due for opthalmologic screening follow up for steroid-induced complications  - Monitoring of bone density and cardiovascular risk modification (ie, lipids, BP, blood glucose/DM) per PCP    4. Follow up ~3-4 months

## 2019-03-22 ENCOUNTER — Other Ambulatory Visit (HOSPITAL_COMMUNITY): Payer: Self-pay

## 2019-03-22 LAB — PULMONARY FUNCTION TESTING
FEF25-75-%Pred-Pre: 70
FEF25-75-Pre: 1.83
FEV1-%Pred-Pre: 90
FEV1-Pre: 2.74
FEV1/FVC-Pre: 71
FVC-%Pred-Pre: 99
FVC-Pre: 4.08

## 2019-04-13 ENCOUNTER — Encounter (HOSPITAL_BASED_OUTPATIENT_CLINIC_OR_DEPARTMENT_OTHER): Payer: Self-pay

## 2019-04-18 ENCOUNTER — Ambulatory Visit (HOSPITAL_BASED_OUTPATIENT_CLINIC_OR_DEPARTMENT_OTHER): Admit: 2019-04-18 | Discharge: 2019-04-18 | Disposition: A | Discharge status: Home / Self Care | Payer: Self-pay

## 2019-04-26 ENCOUNTER — Ambulatory Visit (HOSPITAL_BASED_OUTPATIENT_CLINIC_OR_DEPARTMENT_OTHER): Payer: Medicare Other

## 2019-04-26 ENCOUNTER — Other Ambulatory Visit
Admit: 2019-04-26 | Discharge: 2019-04-26 | Disposition: A | Discharge status: Home / Self Care | Payer: Medicare Other | Attending: Otolaryngology | Admitting: Otolaryngology

## 2019-04-26 ENCOUNTER — Other Ambulatory Visit (HOSPITAL_BASED_OUTPATIENT_CLINIC_OR_DEPARTMENT_OTHER): Payer: Self-pay | Admitting: Otolaryngology

## 2019-04-26 ENCOUNTER — Ambulatory Visit (HOSPITAL_BASED_OUTPATIENT_CLINIC_OR_DEPARTMENT_OTHER): Payer: Medicare Other | Admitting: Otolaryngology

## 2019-04-26 ENCOUNTER — Ambulatory Visit (HOSPITAL_BASED_OUTPATIENT_CLINIC_OR_DEPARTMENT_OTHER): Payer: Medicare Other | Admitting: Hematology & Oncology

## 2019-04-26 ENCOUNTER — Ambulatory Visit: Payer: Medicare Other | Attending: Otolaryngology

## 2019-04-26 DIAGNOSIS — C4449 Other specified malignant neoplasm of skin of scalp and neck: Secondary | ICD-10-CM | POA: Insufficient documentation

## 2019-04-26 DIAGNOSIS — J449 Chronic obstructive pulmonary disease, unspecified: Secondary | ICD-10-CM

## 2019-04-26 DIAGNOSIS — Z79899 Other long term (current) drug therapy: Secondary | ICD-10-CM

## 2019-04-26 DIAGNOSIS — C4441 Basal cell carcinoma of skin of scalp and neck: Secondary | ICD-10-CM | POA: Insufficient documentation

## 2019-04-26 DIAGNOSIS — Z87891 Personal history of nicotine dependence: Secondary | ICD-10-CM | POA: Insufficient documentation

## 2019-04-26 DIAGNOSIS — C07 Malignant neoplasm of parotid gland: Secondary | ICD-10-CM

## 2019-04-26 DIAGNOSIS — L989 Disorder of the skin and subcutaneous tissue, unspecified: Secondary | ICD-10-CM

## 2019-04-26 DIAGNOSIS — C4442 Squamous cell carcinoma of skin of scalp and neck: Secondary | ICD-10-CM

## 2019-04-26 DIAGNOSIS — Z942 Lung transplant status: Secondary | ICD-10-CM | POA: Insufficient documentation

## 2019-04-26 DIAGNOSIS — R590 Localized enlarged lymph nodes: Secondary | ICD-10-CM

## 2019-04-28 LAB — PATHOLOGY, SURGICAL

## 2019-05-01 NOTE — Progress Notes (Signed)
[facility] - Head and Neck Oncology Tumor Board Note   [address] [place] [address]    Date: 05/01/2019  Time:8:12 PM  Attending Provider: Gerhard Munch    Name: Dakota Marsh  MRN:  [id number]  DOB:. 1951-02-08    Tumor Board Date: 05/03/2019    Referring Providers: N/A    Brief History: 68 year-old M with history of double lung transplant on chronic immunosuppression with multiply recurrent cutaneous carcinomas. He has had multiple basal cell carcinomas from his sun-exposed areas.  More recently over the past year while he was residing in [place], he developed an ulcerated lesion in the left pinna and feels that this has rapidly progressed over the last few months with subcutaneous extension into the infraauricular area.  The patient underwent a biopsy, which was reported out as basal cell carcinoma. On exam he had an ulceration in the left pinna measuring approximately 1 x 1 cm in greatest dimension along with a palpable fixed ill-defined mass occupying the left infraauricular area and palpable level II and V ipsilateral lymph nodes.     Pathology and Staging:     Primary Site: skin    Stage: III    Histopathology:basal cell carcinoma    Type: recurrent    Classifications: clinical T: 1 N: 1 M: X    Prior Surgery: Yes    Prior Radiation Therapy: none    Prior Chemotherapy: none    Current Clinical and Radiographic Findings:   MRI face wwo contrast: 05/03/2019  ***    Clinical Trial Candidate : ***     RECOMMENDATIONS/PLAN: The patient was clinical status was reviewed today with members of the multidisciplinary head and neck tumor conference including representatives from Otolaryngology , [facility] and Medical Dentistry , and the nature of their condition was discussed at length.     Plan:     Harle Battiest, MD  [facility] - Head and Neck Oncology  _________________________________________________________________________________________________      The proceeding note  represents the consensus recommendation or opinion of the  [facility] of [place] Multidisciplinary Tumor Board. Board membership consists of  representatives of the Medicine, Radiation Oncology, General and Subspecialty Surgery, Dentistry and Radiology services. The recommendations made at Tumor Board are based on a brief and necessarily limited review of data related to management of the patient's cancer-related problem. The data presented may not fully reflect the data available elsewhere in the medical record. The recommendations should not be considered binding and should be considered in the context of more detailed information available to the patient's managing caregivers. In making these recommendations the Board has discussed relevant staging, prognostic factors, and currently accepted management guidelines including those recommended by the [place] of Clinical Oncology.

## 2019-05-03 ENCOUNTER — Other Ambulatory Visit (HOSPITAL_BASED_OUTPATIENT_CLINIC_OR_DEPARTMENT_OTHER): Payer: Medicare Other

## 2019-05-03 ENCOUNTER — Other Ambulatory Visit (HOSPITAL_COMMUNITY): Payer: Self-pay

## 2019-05-03 ENCOUNTER — Encounter (HOSPITAL_BASED_OUTPATIENT_CLINIC_OR_DEPARTMENT_OTHER): Payer: Medicare Other

## 2019-05-03 ENCOUNTER — Encounter (HOSPITAL_BASED_OUTPATIENT_CLINIC_OR_DEPARTMENT_OTHER): Payer: Self-pay | Admitting: Otolaryngology

## 2019-05-03 LAB — BASIC METABOLIC PANEL, EXTERNAL
Calcium, External: 8.6
Carbon Dioxide (Total), External: 24
Chloride, External: 109
Creatinine, External: 1.09
Glucose, External: 107
Potassium, External: 4.1
Sodium, External: 140
Urea Nitrogen, External: 23

## 2019-05-03 LAB — CBC (HEMOGRAM), EXTERNAL
Hematocrit, External: 34.8
Platelet Count, External: 227
WBC, External: 5.3

## 2019-05-03 LAB — SIROLIMUS, EXTERNAL: Sirolimus, External: 4.5

## 2019-05-03 LAB — CBC, DIFF, EXTERNAL: Neutrophils, External: 3.04

## 2019-05-03 LAB — TACROLIMUS, EXTERNAL: Tacrolimus, External: 2.2

## 2019-05-10 ENCOUNTER — Ambulatory Visit (HOSPITAL_BASED_OUTPATIENT_CLINIC_OR_DEPARTMENT_OTHER): Payer: Medicare Other

## 2019-05-10 ENCOUNTER — Ambulatory Visit: Payer: Medicare Other | Attending: Otolaryngology | Admitting: Otolaryngology

## 2019-05-10 ENCOUNTER — Encounter (HOSPITAL_BASED_OUTPATIENT_CLINIC_OR_DEPARTMENT_OTHER): Payer: Self-pay | Admitting: Otolaryngology

## 2019-05-10 ENCOUNTER — Other Ambulatory Visit (HOSPITAL_BASED_OUTPATIENT_CLINIC_OR_DEPARTMENT_OTHER): Payer: Self-pay | Admitting: Otolaryngology

## 2019-05-10 DIAGNOSIS — Z7901 Long term (current) use of anticoagulants: Secondary | ICD-10-CM | POA: Insufficient documentation

## 2019-05-10 DIAGNOSIS — N189 Chronic kidney disease, unspecified: Secondary | ICD-10-CM | POA: Insufficient documentation

## 2019-05-10 DIAGNOSIS — I10 Essential (primary) hypertension: Secondary | ICD-10-CM | POA: Insufficient documentation

## 2019-05-10 DIAGNOSIS — C4442 Squamous cell carcinoma of skin of scalp and neck: Secondary | ICD-10-CM | POA: Insufficient documentation

## 2019-05-10 DIAGNOSIS — M810 Age-related osteoporosis without current pathological fracture: Secondary | ICD-10-CM | POA: Insufficient documentation

## 2019-05-10 DIAGNOSIS — C4490 Unspecified malignant neoplasm of skin, unspecified: Secondary | ICD-10-CM

## 2019-05-10 DIAGNOSIS — C07 Malignant neoplasm of parotid gland: Secondary | ICD-10-CM

## 2019-05-10 DIAGNOSIS — C4441 Basal cell carcinoma of skin of scalp and neck: Secondary | ICD-10-CM

## 2019-05-10 DIAGNOSIS — F419 Anxiety disorder, unspecified: Secondary | ICD-10-CM | POA: Insufficient documentation

## 2019-05-10 DIAGNOSIS — Z87891 Personal history of nicotine dependence: Secondary | ICD-10-CM | POA: Insufficient documentation

## 2019-05-10 DIAGNOSIS — C4492 Squamous cell carcinoma of skin, unspecified: Secondary | ICD-10-CM

## 2019-05-10 DIAGNOSIS — Z86718 Personal history of other venous thrombosis and embolism: Secondary | ICD-10-CM | POA: Insufficient documentation

## 2019-05-10 DIAGNOSIS — Z942 Lung transplant status: Secondary | ICD-10-CM | POA: Insufficient documentation

## 2019-05-10 DIAGNOSIS — I2510 Atherosclerotic heart disease of native coronary artery without angina pectoris: Secondary | ICD-10-CM | POA: Insufficient documentation

## 2019-05-10 MED ORDER — OXYCODONE HCL 10 MG OR TABS
10.0000 mg | ORAL_TABLET | Freq: Four times a day (QID) | ORAL | 0 refills | Status: DC | PRN
Start: 2019-05-10 — End: 2019-06-01

## 2019-05-10 NOTE — Progress Notes (Signed)
[facility] - Head and Ne[facility] Oncology Tumor Board Note   [address] [place] [address]    Date: 05/01/2019  Time:8:12 PM  Attending Provider: Gerhard Munch    Name: Dakota Marsh  MRN:  [id number]  DOB:. [birth year]    Tumor Board Date: 05/03/2019    Referring Providers: N/A    Brief History: 68 year-old M with history of double lung transplant on chronic immunosuppression with multiply recurrent cutaneous carcinomas. He has had multiple basal cell carcinomas from his sun-exposed areas.  More recently over the past year while he was residing in [place], he developed an ulcerated lesion in the left pinna and feels that this has rapidly progressed over the last few months with subcutaneous extension into the infraauricular area.  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 On exam, he is noted to have left scalp ulceration that is consistent with his area of BCC lesion biopsy.  His left external ear is very firm and  hardened. There is a large firm left neck mass spanning from the left postauricular region to the preauricular region, and extending inferiorly to his left superior neck (level VA region). There is also a right vertex scalp ulceration with surround scalp skin erythema, which was biopsied in clinic on 04/26/19 and positive for SCC. There is a left level 5a cervical adenopathy. Facial nerve exam is normal and symmetrical. He has imaging for review.     Pathology and Staging:     Primary Site: skin    Stage: III    Histopathology:basal cell carcinoma and squamous cell carcinoma    Type: recurrent    Classifications: clinical T: 4 N: 1 M: X (basal cell) and  T: 1 N: 0 M: X (SCC)    Prior Surgery: Yes    Prior Radiation Therapy: none    Prior Chemotherapy: none    Current Clinical and Radiographic Findings:   MRI face wwo contrast: 05/04/2019  Impressions:  1. Large soft tissue mass involving the left parotid and masticator spaces as described. With the  patient's  clinical history of carcinoma, primary and/or metastatic disease should be of consideration. The lesion  involves the left superficial and deep parotid gland.  2. Refer to above discussion for additional findings and comments.  3. The referring clinic was notified of this report on this date.    CT chest wo contrast: 05/04/2019  1. There are multiple subcentimeter noncalcified nodules within the right and left lung fields involving all  lobes.  2. Cardiomegaly.  3. Mediastinal adenopathy is present as described which is nonspecific for chronic versus active pathology.  Clinical correlation is required.  4. The thoracic spine degenerative disc space disease with facet arthritis and transient osteoporosis.  5. Limited visualization of the lumbar spine demonstrates high-grade spinal canal and lateral recess  stenosis. Suggest correlation to magnetic resonance imaging.  6. Refer to above discussion for additional findings and comments.    CT neck wwo contrast: 04/04/2019  1. Large soft tissue mass involving the left parotid and masticator spaces as described. With the patient's  clinical history of carcinoma, metastatic disease should be of consideration. The lesion involves the left  superficial and deep parotid gland.  2. Refer to above discussion for additional comments and findings of the neck to include the cervical spine.    Clinical Trial Candidate :  Not a candidate for immunotherapy due to history of double lung transplant  RECOMMENDATIONS/PLAN: The patient was clinical status was reviewed today with members of the multidisciplinary head and neck tumor conference including representatives from Otolaryngology , [facility] and Medical Dentistry , and the nature of their condition was discussed at length.     Plan: Surgery with adjuvant radiation    Harle Battiest, MD  [facility] - Head and Neck  Oncology  _________________________________________________________________________________________________      The proceeding note represents the consensus recommendation or opinion of the  [facility] of [place] Multidisciplinary Tumor Board. Board membership consists of  representatives of the Medicine, Radiation Oncology, General and Subspecialty Surgery, Dentistry and Radiology services. The recommendations made at Tumor Board are based on a brief and necessarily limited review of data related to management of the patient's cancer-related problem. The data presented may not fully reflect the data available elsewhere in the medical record. The recommendations should not be considered binding and should be considered in the context of more detailed information available to the patient's managing caregivers. In making these recommendations the Board has discussed relevant staging, prognostic factors, and currently accepted management guidelines including those recommended by the [place] of Clinical Oncology.

## 2019-05-11 ENCOUNTER — Encounter (HOSPITAL_BASED_OUTPATIENT_CLINIC_OR_DEPARTMENT_OTHER): Payer: Self-pay | Admitting: Unknown Physician Specialty

## 2019-05-12 ENCOUNTER — Other Ambulatory Visit (HOSPITAL_COMMUNITY): Payer: Self-pay

## 2019-05-12 LAB — BASIC METABOLIC PANEL, EXTERNAL
Calcium, External: 8.5
Carbon Dioxide (Total), External: 25
Chloride, External: 108
Creatinine, External: 1.07
Glucose, External: 108
Potassium, External: 4.7
Sodium, External: 139
Urea Nitrogen, External: 18

## 2019-05-12 LAB — TACROLIMUS, EXTERNAL: Tacrolimus, External: 6.4

## 2019-05-12 LAB — SIROLIMUS, EXTERNAL: Sirolimus, External: 8.1

## 2019-05-22 ENCOUNTER — Emergency Department
Admission: EM | Admit: 2019-05-22 | Discharge: 2019-05-23 | Disposition: A | Discharge status: Home / Self Care | Payer: Medicare Other | Attending: Student in an Organized Health Care Education/Training Program | Admitting: Student in an Organized Health Care Education/Training Program

## 2019-05-22 ENCOUNTER — Other Ambulatory Visit (HOSPITAL_COMMUNITY): Payer: Self-pay

## 2019-05-22 ENCOUNTER — Other Ambulatory Visit: Payer: Self-pay | Admitting: Student in an Organized Health Care Education/Training Program

## 2019-05-22 DIAGNOSIS — Z20828 Contact with and (suspected) exposure to other viral communicable diseases: Secondary | ICD-10-CM | POA: Insufficient documentation

## 2019-05-22 DIAGNOSIS — N189 Chronic kidney disease, unspecified: Secondary | ICD-10-CM | POA: Insufficient documentation

## 2019-05-22 DIAGNOSIS — D899 Disorder involving the immune mechanism, unspecified: Secondary | ICD-10-CM | POA: Insufficient documentation

## 2019-05-22 DIAGNOSIS — C07 Malignant neoplasm of parotid gland: Secondary | ICD-10-CM | POA: Insufficient documentation

## 2019-05-22 DIAGNOSIS — H6012 Cellulitis of left external ear: Secondary | ICD-10-CM | POA: Insufficient documentation

## 2019-05-22 DIAGNOSIS — E875 Hyperkalemia: Secondary | ICD-10-CM | POA: Insufficient documentation

## 2019-05-22 DIAGNOSIS — R221 Localized swelling, mass and lump, neck: Secondary | ICD-10-CM | POA: Insufficient documentation

## 2019-05-22 DIAGNOSIS — R591 Generalized enlarged lymph nodes: Secondary | ICD-10-CM | POA: Insufficient documentation

## 2019-05-22 DIAGNOSIS — C4491 Basal cell carcinoma of skin, unspecified: Secondary | ICD-10-CM

## 2019-05-22 LAB — COMPREHENSIVE METABOLIC PANEL
ALT (GPT): 10 U/L (ref 10–48)
AST (GOT): 31 U/L (ref 9–38)
Albumin: 4.1 g/dL (ref 3.5–5.2)
Alkaline Phosphatase (Total): 99 U/L (ref 36–161)
Anion Gap: 8 (ref 4–12)
Bilirubin (Total): 0.3 mg/dL (ref 0.2–1.3)
Calcium: 9.2 mg/dL (ref 8.9–10.2)
Carbon Dioxide, Total: 22 meq/L (ref 22–32)
Chloride: 107 meq/L (ref 98–108)
Creatinine: 1.31 mg/dL — ABNORMAL HIGH (ref 0.51–1.18)
Glucose: 112 mg/dL (ref 62–125)
Potassium: 5.4 meq/L — ABNORMAL HIGH (ref 3.6–5.2)
Protein (Total): 7.3 g/dL (ref 6.0–8.2)
Sodium: 137 meq/L (ref 135–145)
Urea Nitrogen: 25 mg/dL — ABNORMAL HIGH (ref 8–21)
eGFR by CKD-EPI: 56 mL/min/{1.73_m2} — ABNORMAL LOW (ref >59)

## 2019-05-22 LAB — SARS-COV-2 (COVID-19) QUALITATIVE RAPID PCR: COVID-19 Coronavirus Qual PCR Result: NOT DETECTED

## 2019-05-22 LAB — C_REACTIVE PROTEIN: C_Reactive Protein: 82.9 mg/L — ABNORMAL HIGH (ref 0.0–10.0)

## 2019-05-22 LAB — CBC, DIFF
% Basophils: 0 %
% Eosinophils: 0 %
% Immature Granulocytes: 1 %
% Lymphocytes: 15 %
% Monocytes: 12 %
% Neutrophils: 72 %
% Nucleated RBC: 0 %
Absolute Eosinophil Count: 0 10*3/uL (ref 0.00–0.50)
Absolute Lymphocyte Count: 1.41 10*3/uL (ref 1.00–4.80)
Basophils: 0.02 10*3/uL (ref 0.00–0.20)
Hematocrit: 37 % — ABNORMAL LOW (ref 38–50)
Hemoglobin: 11.3 g/dL — ABNORMAL LOW (ref 13.0–18.0)
Immature Granulocytes: 0.09 10*3/uL — ABNORMAL HIGH (ref 0.00–0.05)
MCH: 29 pg (ref 27.3–33.6)
MCHC: 30.8 g/dL — ABNORMAL LOW (ref 32.2–36.5)
MCV: 94 fL (ref 81–98)
Monocytes: 1.1 10*3/uL — ABNORMAL HIGH (ref 0.00–0.80)
Neutrophils: 6.69 10*3/uL (ref 1.80–7.00)
Nucleated RBC: 0 10*3/uL
Platelet Count: 164 10*3/uL (ref 150–400)
RBC: 3.9 10*6/uL — ABNORMAL LOW (ref 4.40–5.60)
RDW-CV: 13.6 % (ref 11.6–14.4)
WBC: 9.31 10*3/uL (ref 4.3–10.0)

## 2019-05-22 LAB — SED RATE: Erythrocyte Sedimentation Rate: 53 mm/h — ABNORMAL HIGH (ref 0–15)

## 2019-05-23 ENCOUNTER — Other Ambulatory Visit: Payer: Self-pay

## 2019-05-23 ENCOUNTER — Telehealth: Payer: Self-pay

## 2019-05-23 DIAGNOSIS — R9431 Abnormal electrocardiogram [ECG] [EKG]: Secondary | ICD-10-CM

## 2019-05-23 NOTE — Telephone Encounter (Signed)
[facility]

## 2019-05-24 LAB — EKG 12 LEAD
Atrial Rate: 81 {beats}/min
Diagnosis: NORMAL
P Axis: 19 degrees
P-R Interval: 148 ms
Q-T Interval: 384 ms
QRS Duration: 104 ms
QTC Calculation: 446 ms
R Axis: 5 degrees
T Axis: 185 degrees
Ventricular Rate: 81 {beats}/min

## 2019-05-30 ENCOUNTER — Ambulatory Visit: Payer: Medicare Other | Attending: Facial Plastic Surgery | Admitting: Otolaryngology

## 2019-05-30 ENCOUNTER — Ambulatory Visit (HOSPITAL_BASED_OUTPATIENT_CLINIC_OR_DEPARTMENT_OTHER): Payer: Medicare Other | Admitting: Facial Plastic Surgery

## 2019-05-30 ENCOUNTER — Encounter (HOSPITAL_BASED_OUTPATIENT_CLINIC_OR_DEPARTMENT_OTHER): Payer: Self-pay

## 2019-05-30 ENCOUNTER — Other Ambulatory Visit (HOSPITAL_BASED_OUTPATIENT_CLINIC_OR_DEPARTMENT_OTHER): Payer: Self-pay | Admitting: Transplant

## 2019-05-30 ENCOUNTER — Ambulatory Visit (HOSPITAL_BASED_OUTPATIENT_CLINIC_OR_DEPARTMENT_OTHER): Payer: Medicare Other | Admitting: Audiologist

## 2019-05-30 VITALS — BP 149/91 | HR 84 | Temp 98.2°F | Ht 68.0 in | Wt 155.4 lb

## 2019-05-30 VITALS — BP 149/91 | HR 84 | Temp 98.2°F | Ht 68.0 in | Wt 155.0 lb

## 2019-05-30 DIAGNOSIS — K118 Other diseases of salivary glands: Secondary | ICD-10-CM

## 2019-05-30 DIAGNOSIS — H6192 Disorder of left external ear, unspecified: Secondary | ICD-10-CM

## 2019-05-30 DIAGNOSIS — Z942 Lung transplant status: Secondary | ICD-10-CM

## 2019-05-30 DIAGNOSIS — C07 Malignant neoplasm of parotid gland: Secondary | ICD-10-CM | POA: Insufficient documentation

## 2019-05-30 DIAGNOSIS — H903 Sensorineural hearing loss, bilateral: Secondary | ICD-10-CM

## 2019-05-30 DIAGNOSIS — C44310 Basal cell carcinoma of skin of unspecified parts of face: Secondary | ICD-10-CM

## 2019-05-30 DIAGNOSIS — H905 Unspecified sensorineural hearing loss: Secondary | ICD-10-CM

## 2019-05-30 MED ORDER — ATORVASTATIN CALCIUM 20 MG OR TABS
ORAL_TABLET | ORAL | 11 refills | Status: AC
Start: 2019-05-30 — End: ?

## 2019-05-30 NOTE — Progress Notes (Signed)
Dakota Marsh was referred by Audrie Gallus MD and seen in conjunction with otology visit.   Further details can be viewed in the note dictated by the attending otologist.  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  Left sided hearing loss.  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          Objective:  Audiogram  completed.  See scan of results under media tab in Epic.       Audiometry:  Right Ear:  WNL at 250 - 1000 Hz sharpley sloping to a moderate to profound sensorineural hearing loss.  Left Ear: BC  WNL at 250 - 1000 Hz sharpley sloping to a moderate to profound sensorineural hearing loss. AC was not tested.      Speech Discrimination (Word Recognition):  Right Ear:  88% words correct at 75 dBHL, presented in quiet   Left Ear:  DNT    NOTE: could not test left ear AC due to condition of outer ear, bleeding, drainage and swelling.  See BC left only.        Plan:  Patient to be seen in otology immediately following this visit.

## 2019-05-30 NOTE — Progress Notes (Signed)
[facility]  Date of Service: @DATE @  Attending Physician: Dr. Bess Kinds, MD PhD    CC:  Ear Problem      Referral Source:  This patient was seen at the kind request of Aniche, Eberechukwu Oli*.    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 He noticed that his left neck & external appeared abnormal about 1 year ago. He presented to a dermatologist and was given antibiotic ointment. After moving to [place], he was seen by another dermatologist who biopsied it and returned the above pathology. He has been evaluated by Dr. Gerhard Munch and Dr. Lulu Riding. Over the past 2-3 months, he has developed left ear erythema, swelling, and growth of the mass around his year. He has had drainage from some lesions on his external ear, but nothing internally. His left face and ear are very painful. He reports worsening L sided hearing. He denies vertigo or recent ear infections.     Review of systems:    General, eye, ENT, heart, lung, stomach, muscular, urinary, skin, neurologic, mental heath, endocrine, blood/lymph, and immunological systems and were reviewed and were negative except as noted in the HPI and here: weight loss, jaw pain, nausea/vomiting related to antibiotics, sore throat.    Medications:  No outpatient medications have been marked as taking for the 05/30/19 encounter (Office Visit) with Gaston Islam, MD.       Allergies:  Codeine    Past Medical History:  Past Medical History:   Diagnosis Date   . Allergic rhinitis due to allergen    . Anxiety    . Asthma    . COPD (chronic obstructive pulmonary disease) ([place])    . Depression    . Hypertension    . Pulmonary hypertension ([place])        I personally reviewed and confirmed the ROS, past medical history, past surgical history,  family history and social history in the record with the patient today.    Past history of known ototoxic exposures: no     Social/Employment  Social History     Tobacco Use   . Smoking status: Former Smoker     Packs/day: 1.50     Types: Cigarettes     Quit date: 11/13/2003     Years since quitting: 15.5   . Smokeless tobacco: Former Chief Strategy Officer Use Topics   . 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      Physical Exam:  BP (!) 149/91   Pulse 84   Temp 98.2 F (36.8 C) (Temporal)   Ht 5\' 8"  (1.727 m)   Wt 155 lb (70.3 kg)   SpO2 97%   BMI 23.57 kg/m     General appearance: healthy, no distress, gait normal, voice normal, alert and oriented  Eyes: Lids/periorbital skin normal, Conjunctivae/corneas clear, PERRL, EOM's intact, no nystagmus, smooth pursuit normal    Microscopic ear exam:   Right external ear normal, ear canal normal, TM - normal and middle ear aerated.  Left pinnae with ulcerative lesion, external ear erythematous with firmness in the preauricular, infra-auricular, and postauricular areas,  L ear canal relatively normal except for posterior pink bulge, normal TM    Head thrust test normal  Post head shake nystagmus- normal.  Romberg: normal  Past pointing: normal.    Nose/sinus exam: Nares normal. No nasal drainage. No sinus tenderness.  Oral cavity/Oropharynx: normal, floor of mouth soft with no masses or lesions, NoTMJ tenderness   Neck: left parotid mass and firmness extending from ear to level 2 & level 5  Carotids: 2+ bilaterally    Neuro: cranial nerves 2-12 intact  Skin: sun damage, scalp lesions    Diagnostic Studies  An audiogram was obtained today and was available for review.    Audiometry:  Right Ear:  WNL at 250 - 1000 Hz sharpley sloping to a moderate to profound sensorineural hearing loss.  Left Ear: BC  WNL at 250 - 1000 Hz sharpley sloping to a  moderate to profound sensorineural hearing loss. AC was not tested.      Speech Discrimination (Word Recognition):  Right Ear:  88% words correct at 75 dBHL, presented in quiet   Left Ear:  DNT    Imaging  CT Neck 05/22/19:  1. There is a conglomeration of centrally hypoattenuating lymph nodes   underneath the left ear, corresponding to the clinical area of interest, which   have increased in size and conspicuity over serial exams. These are favored   to represent necrotic lymph nodes. However, given the overlying fat   stranding/inflammatory changes, a superimposed infection/developing abscess is   difficult to exclude.     2. Other, diffusely prominent left-sided cervical nodes have increased in size   compared to April 04, 2019. These are nonspecific in appearance and could be   reactive in nature, however recommend attention on follow-up.     3. Stable, small pulmonary nodules in the bilateral lung apices of   indeterminate significance. Recommend attention on follow-up.    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 He has a bulge in his left posterior ear canal that could be involved with the cancer. Dr. Sabino Dick will plan to send a frozen on this margin intra-operatively. He will coordinate with Dr. Gerhard Munch regarding surgical resection and the need for an external auditory canal margin and reconstruction if necessary.    Kayleen Memos, MD MS  Resident Physician - Otolaryngology & Head & Neck Surgery

## 2019-05-30 NOTE — Progress Notes (Signed)
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 He reports that he initially noticed a left neck lesion approximately one year ago when he was in [place]. He was seen by a dermatologist locally at that time and was treated with antibiotics. Upon his return to [place], he was seen by another dermatologist who then performed a biopsy of his post auricular region and scalp with pathology result as above.      Examination demonstrates a 10-12 cm left cheek and ear induration extending to neck. He has additional ulcerations of the head an neck including a 3 cm mobile vertex nodular scalp ulceration. His facial nerve function is completely intact.    Review of his CT neck from 05/22/19 when he presented to the [facility] ED demonstrates:    IMPRESSION:  1. There is a conglomeration of centrally hypoattenuating lymph nodes   underneath the left ear, corresponding to the clinical area of interest, which   have increased in size and conspicuity over serial exams. These are favored   to represent necrotic lymph nodes. However, given the overlying fat   stranding/inflammatory changes, a superimposed infection/developing abscess is   difficult to exclude.     2. Other, diffusely prominent left-sided cervical nodes have increased in size   compared to April 04, 2019. These are nonspecific in appearance and could be    reactive in nature, however recommend attention on follow-up.     3. Stable, small pulmonary nodules in the bilateral lung apices of   indeterminate significance. Recommend attention on follow-up.    We had a face to face conversation that was greater than 40 minutes discussing his surgical resection and possible needs for facial reanaimation. These would include possible eyelid weight placement, lower lid tightening, browlift, cable nerve graft vs. Nerve to masseter to a facial nerve branch and sling either with temporalis tendon or tensor fascia lata. Risks including but not limited to infection, bleeding, scarring, eye injury, poor wound healing, need for revision surgery as well was risks of anesthesia discussed. Informed consent was signed, and I look forward to taking care of him with you.    Sincerely,  Amit    Doran Stabler, MD  [place]

## 2019-05-31 ENCOUNTER — Other Ambulatory Visit (HOSPITAL_BASED_OUTPATIENT_CLINIC_OR_DEPARTMENT_OTHER): Payer: Self-pay | Admitting: Otolaryngology

## 2019-05-31 DIAGNOSIS — C4A9 Merkel cell carcinoma, unspecified: Secondary | ICD-10-CM

## 2019-05-31 NOTE — Progress Notes (Addendum)
Attending Statement:    I saw and evaluated the patient and agree with Dr.Hicks's note which I modified or added to as needed above..    In addition to the above encounter, I spent a total duration of 32 minutes of non-face-to-face prolonged service reviewing records   for patient visit on 06/01/19 regarding history of DVT, h/o lung transplant, immunosuppression, CKD, as it pertains to perioperative risk from 158-230pm      Addendum:   Review of prior ekgs from June 2018 and June 2020 from OSH show similar NSR, inverted T and st depression V5-6 (actually in 2018 also seen on some in V4).  The findings on 05/23/19 when he was in the ER for ear pain do not appear new. He is known to have CAD (from cath prior to transplant, he has never had any related symptoms) and this remains a risk factor elevating his risk for perioperative cardiac events. At this time he has no symptoms of angina and has excellent ex tolerance. Surgery cancelled for now for reasons related to his primary diagnosis.   If he were to develop symptoms of angina or decreasing ex tolerance, consideration for stress testing prior to surgery should be discussed (within the lens of the urgency of surgery).

## 2019-06-01 ENCOUNTER — Ambulatory Visit: Payer: Medicare Other | Attending: Internal Medicine | Admitting: Otolaryngology

## 2019-06-01 ENCOUNTER — Ambulatory Visit (HOSPITAL_BASED_OUTPATIENT_CLINIC_OR_DEPARTMENT_OTHER): Payer: Medicare Other | Admitting: Internal Medicine

## 2019-06-01 VITALS — BP 147/95 | HR 82 | Ht 68.0 in | Wt 155.0 lb

## 2019-06-01 VITALS — BP 141/87 | HR 83 | Temp 98.4°F | Ht 68.0 in | Wt 155.0 lb

## 2019-06-01 DIAGNOSIS — Z942 Lung transplant status: Secondary | ICD-10-CM

## 2019-06-01 DIAGNOSIS — D631 Anemia in chronic kidney disease: Secondary | ICD-10-CM

## 2019-06-01 DIAGNOSIS — D509 Iron deficiency anemia, unspecified: Secondary | ICD-10-CM

## 2019-06-01 DIAGNOSIS — Z7901 Long term (current) use of anticoagulants: Secondary | ICD-10-CM | POA: Insufficient documentation

## 2019-06-01 DIAGNOSIS — Z01818 Encounter for other preprocedural examination: Secondary | ICD-10-CM | POA: Insufficient documentation

## 2019-06-01 DIAGNOSIS — D849 Immunodeficiency, unspecified: Secondary | ICD-10-CM

## 2019-06-01 DIAGNOSIS — C44310 Basal cell carcinoma of skin of unspecified parts of face: Secondary | ICD-10-CM | POA: Insufficient documentation

## 2019-06-01 DIAGNOSIS — Z86718 Personal history of other venous thrombosis and embolism: Secondary | ICD-10-CM | POA: Insufficient documentation

## 2019-06-01 DIAGNOSIS — N189 Chronic kidney disease, unspecified: Secondary | ICD-10-CM

## 2019-06-01 DIAGNOSIS — I2510 Atherosclerotic heart disease of native coronary artery without angina pectoris: Secondary | ICD-10-CM

## 2019-06-01 DIAGNOSIS — E875 Hyperkalemia: Secondary | ICD-10-CM

## 2019-06-01 DIAGNOSIS — C4A9 Merkel cell carcinoma, unspecified: Secondary | ICD-10-CM | POA: Insufficient documentation

## 2019-06-01 DIAGNOSIS — Z01811 Encounter for preprocedural respiratory examination: Secondary | ICD-10-CM

## 2019-06-01 DIAGNOSIS — T380X5A Adverse effect of glucocorticoids and synthetic analogues, initial encounter: Secondary | ICD-10-CM

## 2019-06-01 DIAGNOSIS — T451X5A Adverse effect of antineoplastic and immunosuppressive drugs, initial encounter: Secondary | ICD-10-CM

## 2019-06-01 DIAGNOSIS — E273 Drug-induced adrenocortical insufficiency: Secondary | ICD-10-CM

## 2019-06-01 DIAGNOSIS — K118 Other diseases of salivary glands: Secondary | ICD-10-CM | POA: Insufficient documentation

## 2019-06-01 NOTE — Progress Notes (Signed)
[place] Patient     DATE: 06/01/2019     Requesting Provider: Tally Joe, [id number]  [place] [address]  Planned Procedure/Date: Was planned for the following 06/16/19, but now has been cancelled/postponed in favor of radiation. Unclear if surgery will happen or not. Previously planned left total auriculectomy, left radical parotidectomy with facial nerve sacrifice, left temporal bone resection, neck dissection, free flap reconstruction as indicated and facial reanimation.  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 Here with his daughter Dakota Marsh.  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 Went to [facility] ER 05/22/19 where he was evaluated by ENT for worsening left ear pain and progression of disease on CT, was prescribed ciprofloxacin for possibility of irregular chondritis    The patient saw ENT earlier today and now that they have a diagnosis of Merkel cell and have decided to him see medical oncology and radiation oncology and will likely  pursue radiation for now. He is no longer planned for surgery but surgery may come up again depending on rad onc and med onc evaluations go.  He has PET scan planned 06/06/19 to evaluate for metastatic disease. For additional information regarding indications for this surgery, and the patient's history related directly to this procedure, please see the surgical notes. This evaluation will focus upon the medical  issues that may impact the patient's surgery.     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 Transplant was notable for pleural scarring requiring extensive adhesion take down, and post-op sternal dehisence c/b pneumothorax/pneumomediastinum requiring return to OR for sternal plating and rewiring. He's also had shingles 2017, CMV viremia 2018. Last transplant f/u with Dr. Waymond Cera 03/20/19, at which time he was continued on tacrolimus, sirolumus, prednisone, bactrim, fluconazole, valacylovir, nasal fluticasone, loratidine PRN, azithromycin.     He has f/u with Dr. Waymond Cera 06/18/19. 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 No recent URI, pneumonia, SOB. He is being followed for still normal but slightly declining PFTs.     #H/o CAD  LHC 06/2015 with 50% distal RCA stenosis during workup for his lung transplant. He had and has never had any symptoms of chest pain.The study was done for pretransplant workup only.  Pt says that a re-read by a different cardiologist said it was 30%. Has been atorvastatin since 2016. Never has chest pain at rest or with exertion. He has chronic intermittent lower extremity swelling after he eats salty foods. No orthopnea, palpitations, pnd.    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 2nd of L left 05/2017, now indefinite warfarin. No h/o PE.  No history of bleeds other than occasional epistaxis when INR was too elevated (as  high as 7). No epistaxis with INR between 2-3. Hasn't had a procedure requiring holding of warfarin yet. Per pt, his PCP recommends he bridges for the ENT surgery.    #Hypertension  Normally 140/80s. Higher Bps associated with stress after his wife died. Has taken  lisinopril for 1.5 years. No headaches unrelated to left parotid mass, no vision changes.     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 Has had several AKIs documented. He's monitored with labs q6weeks by his post-lung transplant team.    #Hyperkalemia - chronic  05/22/19 K of 5.4, and ranges from 4.1-high 5s since 11/2018. Per pulm transplant note 03/20/19, thought secondary to CKD from tacrolimus and bactrim. 01/2019 he went to ER for fatigue, found to have K of 7 and AKI on CKD with creatinine of 2.1 so he was monitored overnight and given two doses kayexalate. K lowered to 5.3 by the next day, and he was given prescription for 5 days kayexalate. Changed bactrim from daily to every other day.and is on low potassium diet. Pt says he was changed from bactrim daily to every other day to improve his hyperkalemia.    #Chronic iron deficiency anemia  #Anemia of CKD  Records in Epic show hgb stable between 9 and 11 since 2017. 01/2019 hgb 9.4--> 04/2019 hgb 10.7 -->05/22/19 hgb 11.3. Labs from 11/2018 showed normal b12, ferritin of 180, and iron labs wnl so he was taken off supplemental iron. As above, no h/o GI bleed.    #Hyperlipidemia  Has been on atorvastatin since 2016.      #GERD c/b Shatzkis ring s/p dilation  In 2016 had dysphagia prompting EGD 2016, had dilation of shatzkis ring during same EGD. No current heartburn or dysphagia. Currently taking pantoprazole as he's on prednisone.    #Depression/Anxiety  Talks a lot with daughter Dakota Marsh which is great therapy for him. Escitalopram '10mg'$  daily. Feels his symptoms are well controlled.    PROBLEM LIST:  In addition to above:  #Osteoperosis  #Pulmonary nodules: stable 2018-11/2018    From epic:  Patient Active Problem List    Diagnosis Date Noted   . Skin lesion of left ear 03/20/2019   . Seasonal allergic rhinitis due to pollen 11/30/2018   . Hemoptysis 11/30/2018   . Bronchiolitis obliterans syndrome, grade 0P ([place]) 06/09/2017   . Rash and nonspecific skin eruption  02/17/2017   . Pulmonary nodules 10/29/2016   . Abnormal PFTs (pulmonary function tests) 08/31/2016   . Dyslipidemia 03/04/2016     I 28 Feb 2016 Dyslipidemia (On statin with sub optimal therapy due to presumed intolerance of statin therapy)  II Lipid Panel  Total Cholesterol       . Adverse reaction to statin medication 03/04/2016     I Adverse Reaction to statin medication (on less than optimal statin therapy in presence of documented coronary artery disease by angiography)  II Lipid Panel Data  Total Chol  ON pravastatin 20 mg daily.         . Chest wall pain following surgery 02/26/2016   . Physical deconditioning 10/31/2015   . Immunosuppression ([place]) 10/16/2015   . Lung transplanted ([place]) 09/24/2015   . CAD (coronary artery disease) 40-50%lesion 09/24/2015        . 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 Hypertension 04/03/2015   . GERD (gastroesophageal reflux disease) 04/03/2015   . Depression 04/03/2015     OSA Screen/STOP BANG  Snore loudly? Yes  Tired, fatigued, or sleepy often  during the daytime? Yes  Observed apnea? No  Blood Pressure high or on treatment? Yes    BMI >35?  No  Age >50? Yes  Neck circumference >43 cm (male) >41 cm (male)? No  Male Gender? No    Risk for OSA: Moderate    PAST SURGICAL HISTORY:  Past Surgical History:   Procedure Laterality Date   . RT/LT HEART CATHETERS     Appendectomy 1964  Colonoscopy 2013  Cervical fusion 1992  Bilateral orthotopic lung transplant 2017    SURGICAL COMPLICATIONS:  Pt denies any of the below  '[ ]'$  difficulty with anesthesia  '[ ]'$  postoperative nausea  '[ ]'$  postoperative delirium  '[ ]'$  perioperative bleeding / clotting  '[ ]'$  perioperative hemodynamic instability    MEDICATIONS:  Current Outpatient Medications   Medication Sig Dispense Refill   . Acetaminophen 325 MG Oral Tab Take 650 mg by mouth every 6 hours as needed for pain. '1000mg'$  4x day every 5 hours     . atorvastatin 20 MG tablet TAKE 1 TABLET BY MOUTH DAILY 30 tablet 11   .  azithromycin 250 MG tablet Take 1 tablet (250 mg) by mouth daily. 30 tablet 11   . escitalopram 10 MG tablet take 1 tablet by mouth daily for 30 DAYS     . fluconazole 200 MG tablet Take 1 tablet (200 mg) by mouth daily. 90 tablet 3   . Gabapentin 300 MG Oral Cap Take 3 capsules (900 mg) by mouth 3 times a day. (Patient taking differently: Take 600 mg by mouth 3 times a day. ) 270 capsule 0   . lisinopril 10 MG tablet   10 mg = 1 tab, PO, Daily, 04/26/19 15:41:48     . pantoprazole 40 MG EC tablet Take 1 tablet (40 mg) by mouth daily. 30 tablet 11   . predniSONE 5 MG tablet Take 1 tablet (5 mg) by mouth daily. 30 tablet 11   . sirolimus 0.5 MG tablet Take 1 tablet (0.5 mg) by mouth every morning. Total dose now 1.5 mg every morning 30 tablet 11   . sulfamethoxazole-trimethoprim 400-80 MG tablet Take 1 tablet by mouth EVERY OTHER DAY 30 tablet 11   . tacrolimus 0.5 MG capsule Take 2 mg by mouth daily with breakfast.     . tacrolimus 0.5 MG capsule Take 1.5 mg by mouth daily with dinner.     . traMADol 100 MG tablet Take 100 mg by mouth 4 times a day.     . valACYclovir 1 g tablet   1 gm, PO, Daily, 04/26/19 15:40:15     . warfarin 2 MG tablet 2 mg.       No current facility-administered medications for this visit.        ALLERGIES:  Review of patient's allergies indicates:  Allergies   Allergen Reactions   . Codeine      ANXIETY/DYSPNEA       SOCIAL HISTORY  Living Situation: Lives in [place] in [address] alone with 1 dog.  Occupation: Worked part time until recently in Architect.  Marital status: Widow.  Children: 4 kids, grandkids.  Surrogate decision maker: Daughter Dakota Marsh, has paperwork.    HEALTH RELATED BEHAVIORS  Smoking: quit 2005, 60 pack years. (prev 1.5 ppd)  Alcohol: 2 beers daily.  Drugs: Edible MJ occasionally. No IVDU, heroine, meth.     FAMILY HISTORY:  Direct family members with reaction to anesthesia? No  No major family history noted.    #  FUNCTIONAL CAPACITY/ EXERCISE TOLERANCE:  Able to walk  more than 5 city blocks on flat surface.   Able to climb 2 flights of stairs without stopping.   Exercise tolerance is not limited by shortness of breath, chest pain or body pain.    ROS:  CONSTITUTIONAL: Denies, fatigue, daytime somnolence, fever and chills  ENT: negative, .  Does have and ear pain as per HPI  OPHTHALMIC: Denies and diplopia  RESPIRATORY: Denies, dyspnea on exertion, dyspnea at rest, cough and wheeze  SLEEP APNEA SYMPTOMS: has no witnessed apnea and irregular snoring  CARDIOVASCULAR: Denies, chest pain, palpitations, dyspnea, orthopnea and paroxysmal nocturnal dyspnea  GASTROINTESTINAL: has no abdominal pain .  GENITOURINARY: Denies hematuria  INTEGUMENTARY: Denies  itching  RHEUMATOLOGIC/MSK: Denies, joint stiffness and gritty eyes  PSYCHOSOCIAL: Denies, dysthymia and any psychological problems  ENDOCRINE: negative, Denies, polyuria and polydipsia. has no history of diabetes.  has no history of thyroid disease.  HEMATOLOGIC: anemia.  has history of DVT/VTE/PE.  NEUROLOGICAL: has no history of strokes/ TIAs, has no history of seizures, has no history of chronic pain and/or neuropathy.      OTHER:            PHYSICAL EXAM:  BP (!) 147/95   Pulse 82   Ht '5\' 8"'$  (1.727 m)   Wt 155 lb (70.3 kg)   SpO2 100%   BMI 23.57 kg/m   Physical Exam  General: alert, no distress   Head: Normocephalic. Left post-auricular hard mass under skin, several areas of scalp with erythematous patches/macules with erosion and hemorrhagic crust.  Eyes: Lids/periorbital skin normal, Conjunctivae/corneas clear no icterus  Ears:  left ear with multiple erosions with hemorrhagic crust  Nose: Pink mucosa, normal external appearance  Oropharynx: opens mouth > 1 inch, normal dentition, posterior oropharynx visible, uvula midline, moist mm, no oral lesions  Neck: supple, normal neck mobility / range of motion, Mallampati 1  Lungs: lungs clear in all fields no wheeze/rales/rhonchi; no clubbing  Heart: RRR normal s1/2 no S3/4, JVP  is not elevated, 1+ edema of left ankle otherwise no LEE.  Abd: soft, non-tender. BS normal. No masses or organomegaly   Neuro: alert, oriented to situation, PERRL, EOMI, facial muscles, palatal rise, and tongue extrusion symmetric. No tremor. Normal gait.   Skin: see above, otherwise no lesions or rashes on visible skin  Ext: warm, well perfused, no deformities  Psych: normal affect, speech pace and thought content normal  Lymph: no cervical or submandibular lymphadenopathy    STUDIES/ DATA:   Lab Results   Component Value Date    WBC 9.31 05/22/2019    RBC 3.90 (L) 05/22/2019    HEMOGLOBIN 11.3 (L) 05/22/2019    HEMATOCRIT 37 (L) 05/22/2019    MCV 94 05/22/2019    MCH 29.0 05/22/2019    MCHC 30.8 (L) 05/22/2019    PLATELET 164 05/22/2019    RDWCV 13.6 05/22/2019        Lab Results   Component Value Date    SODIUM 137 05/22/2019    POTASSIUM 5.4 (H) 05/22/2019    CL 107 05/22/2019    CO2 22 05/22/2019    BUN 25 (H) 05/22/2019    CREATININE 1.31 (H) 05/22/2019    GLUCOSE 112 05/22/2019    CA 9.2 05/22/2019    AST 31 05/22/2019    ALT 10 05/22/2019    ALK 99 05/22/2019    PROTEIN 7.3 05/22/2019    ALBUMIN 4.1 05/22/2019  Hemoglobin A1C (%)   Date Value   09/06/2015 5.5        Results for orders placed or performed in visit on 05/23/19   EKG 12-LEAD   Result Value Ref Range    Ventricular Rate 81 BPM    Atrial Rate 81 BPM    P-R Interval 148 ms    QRS Duration 104 ms    Q-T Interval 384 ms    QTC Calculation 446 ms    P Axis 19 degrees    R Axis 5 degrees    T Axis 185 degrees    Diagnosis       NORMAL SINUS RHYTHM  ST & T WAVE ABNORMALITY, CONSIDER LATERAL ISCHEMIA  ABNORMAL ECG  WHEN COMPARED WITH ECG OF 30-Sep-2015 19:58,  QRS AXIS HAS SHIFTED LEFTWARD  ST NOW DEPRESSED IN LATERAL LEADS  T WAVE INVERSION NOW EVIDENT IN LATERAL LEADS  Confirmed by CORSON M.D., [place] ([id number]) on 05/24/2019 7:14:33 AM     Attg addendum: see addendum for comparison    TTE 04/04/15  1. Normal left ventricular size and function,  LVEF 70%. No regional changes seen.  2. Normal LV diastolic function.  3. Systolic flattening of the mitral leaflets without prolapse or regurgitation. Otherwise normal valve anatomy and  function.  4. Normal right ventricular size and function.  5. [place] pulmonary hypertension (PASP 29-34 mmHg, TAPSE 2.7 m/s) with normal RA pressure.  6. Mildly dilated aorta at the sinuses (max 4.0 cm).  7. No pericardial effusion.    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 He may not need surgery for his Merkel cell as ENT/onc is currently planning for radiation instead. The below recommendations are for pre-surgery planning if he does end up needing surgery. There are several comorbid conditions that effect perioperative risk and management including:    # Cardiac risk  # H/o CAD  Pt has diagnosis of CAD because of left heart cath in 2016 showing 50% RCA stenosis which was obtained for pre-lung transplant evaluation, not because of CAD symptoms. He's been on statin therapy since which lowers his risk of cardiovascular disease, and has never had substernal chest pain or dyspnea on exertion and exercise tolerance exceeds 4 mets. He has evidence of inverted T waves in lateral leads on EKG which are stable since early 2018. He has no symptoms of heart failure as TTE in 2016 wnl, and his occasional lower extremity swelling isn't accompanied by more worrisome symptoms of dyspnea on exertion or orthopnea. For the preceding reasons, he of course has some cardiac risk but would not preclude surgery.    All surgical procedures carry some degree of cardiac risk. According to his risk factors:   The Revised Cardiac Risk Index (RCRI) estimates a 6% risk of major cardiac events  (arrhythmia, MI, heart failure, arrest)   The [facility] Perioperative Cardiac Risk Calculator estimates a 0.6 % risk of MI or cardiac arrest. Exercise tolerance exceeds 4 mets with no s/sx of angina.     # Pulmonary risk  # COPD s/p BOLT c/b bronchiolitis obliterans  # Immunosupression  # Infection prophylaxis  Pt is followed closely by [facility] post-lung transplant team Dr. Dirk Dress. Pt reports no SOB, is very active, and hasn't  had recent pneumonia or URI. Pt is on daily prednisone, tacrolimus, and sirolimus as he's s/p lung transplant. This puts him at higher risk of post-surgical infection. Regardless, he should continue these medications peri-operatively to prevent transplant rejection. He should also continue his infection prophylaxis of fluconazole, bactrim, azithromycin, and valacyclovir peri-operatively.    The [facility]) Preoperative Pulmonary Risk Index estimates a 1.6% risk of pulmonary complications.    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 He hasn't had T wave changes concerning for hyperkalemia affecting his cardiac function on EKGs. His baseline creatinine is around 1.6, and recently it was 1.3. He will likely need pre-operative monitoring of his potassium and kidney function to ensure they are stable before surgery. He is at some risk for AKI perioperatively (attg addendum: he asked more about what are the risks with this.  discussed this with him today slight risk of worsening renal function that often improves but sometimes staibilizes at slightly worse function than before and unlikely but present risk of needing HD in worse case)    # H/o VTEs on warfarin  He will be at increased risk for thromboembolism in the perioperative period for time period he has to be off warfarin.  Pt has been on warfarin since 2018 after recurrent VTEs. Currently he's at a higher risk for VTE than before as he has active malignancy. I agree  with plan to bridge as proposed by his PCP.    # Adrenal insufficiency 2/2 chronic steroid use  Pt on low dose of prednisone '5mg'$  daily, but has been on for sometime. Because of chronicity, will recommend stress dosing peri-operatively.    # Iron deficiency anemia  # Anemia of CKD  Recent hgb 11, and has ranged from 9 to 11 for years. No reports of GI bleed. He is no longer iron deficient as of 11/2018. He may also have component of anemia of chronic disease. Regardless, reassuring that it's been stable for years and no evidence of bleed.    RECOMMENDATIONS:  PRE-OP   - Pt or surgery team to inform [facility] clinic if surgery is in fact planned so we can revise recommendations as needed  - Pt given incentive spirometer: 1 week prior to surgery use 10x every time you sit down  - 1 week before surgery date, check BMP for potassium and creatinine. Consider coordinating with his post-lung transplant team  - Bridge from warfarin to lovenox as below with PCP    PREOP MEDICATIONS RECOMMENDATIONS  - Last dose of warfarin 6 days before date of surgery   - Bridge with lovenox, I will call his PCP and see if they prefer to manage this prescription   - Last lovenox injection will be the morning before surgery  - The morning of surgery DO NOT take lisinopril  - The morning of surgery do take atorvastatin, azithromycin, escitalopram, fluconazole, gabapentin, pantoprazole, prednisone, sirolimus, tacrolimus, valacyclovir, tramadol, and tylenol per usual  - At induction, stress dose with '50mg'$  hydrocortisone IV    POST-OP CONSIDERATIONS  - Continue stress dose with '25mg'$  hydrocortisone IV q8h x3 doses. Resume home prednisone postoperatively  - Hold lisinopril until renal function stable and medication needed for BP control  - Strict I/O, daily wt  - Check daily BMP, Mg, CBC  - Goal K >4, Mg >2  - Transfuse to hgb/hct >7/21  - Monitor on telemetry for 24-48 hrs post-op  General Perioperative Recommendations:  . Aggressive  pulmonary hygiene including:   Elevate HOB to 30 degrees  Early mobilization, OOB as tolerated  Incentive spirometry 10x/hr while awake  . DVT/VTE prophylaxis per surgical team preference.   . Treat postoperative pain as appropriate.   . Minimize medications with psychoactive/deliriogenic effects, such as benzodiazepines (e.g. Ativan), anti-dopaminergics (e.g. Reglan), anti-cholinergics (e.g. Benadryl).    . Other delirium precautions include: re-orient frequently as needed; maintain day/night cycle including minimizing naps if possible; have family/friends at bedside as much as possible; mobilize early; keep sensory aids such as glasses or hearing aids nearby; avoid volume depletion and eletrolyte abnormalities, etc.     Thanks for referring this patient to our [facility] for evaluation. Feel free to reach out if questions arise - by phone at our clinic [phone]).

## 2019-06-01 NOTE — Patient Instructions (Addendum)
[facility], [place] floor  [phone] [address]    Phone [phone]  Fax [phone]    You saw the following physician at your visit today:  Polo Riley, MD  Theodis Blaze, MD, MPH  Larae Grooms, MD, MPH  Kayren Eaves, MD  Jose Persia, MD  Sherilyn Dacosta, MD  Delanna Ahmadi, MD  Linde Gillis, MD  Ned Grace, MD  Mortimer Fries, MD    [place]  Your medical history and how it affects your surgery was discussed with you today. Please note that we are consultants and that your surgeon remains your primary provider for this surgery. If you have questions about whether you will be scheduled for surgery or when your surgery will be, please contact your surgeon. If you have significant changes to your health, or new medications have been added to your current regimen between now and the time of surgery, or you have questions about what we discussed today including the medication recommendations below, you may contact our clinic or your surgeon. The best way to contact us is by the phone or fax number listed above. It will typically take at least one business day to respond. Do not contact us through eCare for urgent matters as we may not immediately receive your message.    PERIOPERATIVE MEDICATION AND CARE RECOMMENDATIONS  Careful medication management before and after surgery can help reduce the risk of complications. We recommend the following:    Before the day of surgery:  - Your last dose of warfarin should be 6 days prior to surgery. We'd favor bridging therapy (with low molecular weight therapy injections) but defer to your primary care doctor.  If you do use bridging therapy with low molecular weight therapy than the last dose of this medication should be the morning of the day before surgery for a total of 5 injections.      - If you are taking any of the following  supplements, stop taking them for 1 week prior to surgery: garlic, ginseng, gingko, ephedra, St. John's wort, Kava, vitamin E, and fish oil/omega-3 fatty acids.        On the morning of surgery:  - Take ONLY the following medications (no others) with sips of water: atorvastatin, azithromycin, escitalopram, fluconazole, gabapentin, pantoprazole, prednisone, sirolimus, tacrolimus, valacyclovir  - Do NOT take your lisinopril  - You should already be holding your warfarin.  - It is ok to take tylenol and/or tramadol as prescribed as needed     Additional recommendations:    We should update these recommendations if/when you get a surgery date.

## 2019-06-06 ENCOUNTER — Ambulatory Visit (HOSPITAL_BASED_OUTPATIENT_CLINIC_OR_DEPARTMENT_OTHER): Payer: Medicare Other

## 2019-06-06 ENCOUNTER — Ambulatory Visit: Payer: Medicare Other | Attending: Otolaryngology

## 2019-06-06 ENCOUNTER — Ambulatory Visit (HOSPITAL_BASED_OUTPATIENT_CLINIC_OR_DEPARTMENT_OTHER): Payer: Medicare Other | Admitting: Radiation Oncology

## 2019-06-06 DIAGNOSIS — R918 Other nonspecific abnormal finding of lung field: Secondary | ICD-10-CM | POA: Insufficient documentation

## 2019-06-06 DIAGNOSIS — R911 Solitary pulmonary nodule: Secondary | ICD-10-CM

## 2019-06-06 DIAGNOSIS — C7B1 Secondary Merkel cell carcinoma: Secondary | ICD-10-CM

## 2019-06-06 DIAGNOSIS — C4A9 Merkel cell carcinoma, unspecified: Secondary | ICD-10-CM

## 2019-06-06 DIAGNOSIS — C4A8 Merkel cell carcinoma of overlapping sites: Secondary | ICD-10-CM | POA: Insufficient documentation

## 2019-06-06 LAB — GLUCOSE POC, ~~LOC~~: Glucose (POC): 92 mg/dL (ref 62–125)

## 2019-06-06 NOTE — Progress Notes (Addendum)
[name] is a pleasant 68 year old patient with a history of COPD and emphysema s/p bilateral lung transplant in 09/05/15. 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 He was planned for surgical resection pending internal review of his outside pathology and presents to today for what should have been a preop counseling. 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     His case was discussed at the Merkel cancer tumor board and the recommendation is to hold off on surgery for now, obtain PET CT for staging and then have patient meet with medical and radiation oncology teams. Of note, his CT chest from 05/04/19 revealed multiple bilateral pulmonary nodules that are non-specific.     Patient is accompanied by her daughter who is very much involved in his care. We discussed the pathology finding based on internal review. We discussed the natural history of MCC, modalities and indications for treatment, and expected outcome. We discussed our plans as above and have ordered a STAT PET CT which is now scheduled to be completed on 06/06/19. We also have placed an urgent consult request to our medical and radiation oncology teams, and will plan to present his case again at the next Merkel cell carcinoma TB. We have encouraged him to proceed with his medical consult as scheduled  later today as surgery is not completely ruled out as a treatment option. All of his questions were answered and he understands to contact us should any additional questions or concerns.     A total of 25 independent minutes was spent with patient. Majority of the time was spent on care coordination and plan of care discussion.      Patient was also seen by Dr. Chrystine Oiler.

## 2019-06-07 NOTE — Progress Notes (Signed)
 Office Visit  Dakota Marsh    DOB:  12-01-1950   MRN:   [id number]    DATE OF SERVICE  06/08/2019       CHIEF COMPLAINT      Chief Complaint   Patient presents with   . Follow-up     HISTORY OF PRESENT ILLNESS   Dakota Marsh is a 68 y.o. male who presents today to discuss his PET scan results. He was seen at [facility] in [place] yesterday and was with his daughter. It was explained to him that he now has metastatic disease, which they states was Merkel cancer, which now is in his lungs, liver and bones. He has a great deal of pain in his left jaw due to the cancer. States he can smell the flesh rotting off his left ear and jawline. He has lost weight due to the pain in his left jaw. He told the oncologist there that he wanted to move forward with the radiation therapy for his left ear and jaw. He understands the risks that come with this and states he would rather die on the radiation table trying than to just sit at home and do nothing. He now has low back pain that is constant. He has been using his tramadol for pain but now with little relief. He has both his son and daughter that live close to him that are helping him.   REVIEW OF SYSTEMS   Review of Systems   Constitutional: Positive for fatigue.   HENT: Positive for ear pain, facial swelling and hearing loss.    All other systems reviewed and are negative.    MEDICATIONS     Current Outpatient Medications:   .  atorvaSTATin (LIPITOR) 20 mg tablet, TK 1 T PO  D, Disp: , Rfl:   .  Calcium 500 MG TABS, Take 500 mg by mouth Daily., Disp: , Rfl:   .  fluconazole (DIFLUCAN) 200 MG tablet, Take 200 mg by mouth Daily., Disp: , Rfl:   .  furosemide (LASIX) 20 mg tablet, Take 20 mg by mouth Daily., Disp: , Rfl:   .  gabapentin (NEURONTIN) 600 MG tablet, Take 1 tablet by mouth 3 times daily., Disp: 270 tablet, Rfl: 3  .  lisinopril (PRINIVIL, ZESTRIL) 10 mg tablet, Take 1 tablet by mouth Daily., Disp: 90 tablet, Rfl: 3  .  oxyCODONE (OXYCONTIN) 10 mg ER  abuse-deterrent tablet, Take 1 tablet by mouth every 12 hours for 30 days., Disp: 60 tablet, Rfl: 0  .  oxyCODONE (ROXICODONE) 5 mg tablet, Take 1 tablet by mouth every 4 hours as needed for Pain for up to 30 days., Disp: 40 tablet, Rfl: 0  .  pantoprazole (PROTONIX) 40 mg tablet, TK 1 T PO  D, Disp: , Rfl:   .  predniSONE (DELTASONE) 5 mg tablet, TK 1 T PO QD, Disp: , Rfl:   .  sirolimus (RAPAMUNE) 0.5 mg tablet, Take one tablet by mouth along with 1 mg tablet daily for total of 1.5 mg daily., Disp: 90 tablet, Rfl: 3  .  sirolimus (RAPAMUNE) 1 mg tablet, Take one tablet by mouth along with 0.5 mg tablet daily for a total of 1.5 mg, Disp: 90 tablet, Rfl: 3  .  sodium polystyrene (KAYEXALATE) 15 g/60 mL suspension, Take 15 g by mouth., Disp: , Rfl:   .  sodium polystyrene (KAYEXALATE) powder, MIX as directed and take 15 grams once daily for 3 days, Disp: , Rfl:   .  tacrolimus (PROGRAF) 0.5 mg capsule, TK ONE C PO QPM, Disp: , Rfl:   .  tacrolimus (PROGRAF) 1 mg capsule, TK 2 CS PO Q 12 H, Disp: , Rfl:   .  valACYclovir (VALTREX) 1 g tablet, TK 1 T PO QD, Disp: , Rfl:   .  warfarin (COUMADIN) 2 mg tablet, TK 1 T PO QD, Disp: , Rfl:   ALLERGIES     Allergies     Allergen Reactions    Codeine Shortness Of Breath       Intolerance     No active intolerances/contraindications           VITAL SIGNS   BP 138/88   Pulse 97   Temp 37.3 C (99.1 F) (Tympanic)   Resp 14   Wt 66.8 kg (147 lb 3.2 oz)   SpO2 98%   BMI 22.38 kg/m   PHYSICAL EXAM   Physical Exam   Constitutional: He is oriented to person, place, and time. He appears well-developed and well-nourished.   HENT:   Head: Normocephalic and atraumatic.   Left ear with necrosis noted. Swelling on left side of jaw extending down into neck with noted lymphadenopathy.tender to palpation and very odorous.    Eyes: Pupils are equal, round, and reactive to light.   Cardiovascular: Normal rate and regular rhythm.   Pulmonary/Chest: Effort normal and breath sounds normal.    Abdominal: Soft. Bowel sounds are normal.   Musculoskeletal:         General: Normal range of motion.      Cervical back: Normal range of motion and neck supple.   Neurological: He is alert and oriented to person, place, and time.   Skin: Skin is warm and dry.   Psychiatric: He has a normal mood and affect.   Nursing note and vitals reviewed.    ASSESSMENT AND ORDERS   1. Merkel cell cancer (HCC)  - oxyCODONE (OXYCONTIN) 10 mg ER abuse-deterrent tablet; Take 1 tablet by mouth every 12 hours for 30 days.  Dispense: 60 tablet; Refill: 0    2. Cancer related pain  - oxyCODONE (ROXICODONE) 5 mg tablet; Take 1 tablet by mouth every 4 hours as needed for Pain for up to 30 days.  Dispense: 40 tablet; Refill: 0  - oxyCODONE (OXYCONTIN) 10 mg ER abuse-deterrent tablet; Take 1 tablet by mouth every 12 hours for 30 days.  Dispense: 60 tablet; Refill: 0    3. Weight loss    PLAN   1. Discussed for quite some time his options including hospice. However, he does not want hospice at this time. States he wants to try everything, does not want to give up. However, his biggest concern is pain control and QOL. He is on tramadol, but that is not touching his pain. Will give oxycontin 10 mg ER q 12 hours. Will then have oxycodone 5 mg po for break through pain. Hopefully, this will help with his eating also. Will discuss with Dr. Willy Eddy in [place] setting him up for radiation, which will probably be evaluated for this week. Dr. Willy Eddy does not believe he is a candidate for chemotherapy.   2. Make sure to take medication only as directed. Do not operate any machinery or drive at this time. Call with any ASE. Will monitor closely q 4 weeks while on the medication.   3. Hopefully the pain medication will help him eat better. Explained he needs to eat whatever he is hungry for. Softer foods at this  time and supplement with boost or ensure during the day for calories. Ice cream is also good.   4. Make sure to keep bowels moving, may need  to be on a stool softener such as miralax while taking pain medication.  5. Will monitor closely over next several week. He has a very good support system at home with his daughter and son.  6. Pt agreed with plan of care.      Fae Pippin, ARNP  DATE OF SERVICE 06/08/2019

## 2019-06-10 NOTE — Progress Notes (Signed)
Please see the completed Patient Health History - Otolaryngology form available as a scanned document in the patient's medical record.  This information was reviewed and confirmed by me with the patient today.   I saw and evaluated the patient. I have reviewed the resident's documentation and agree with it.

## 2019-06-12 ENCOUNTER — Ambulatory Visit (HOSPITAL_BASED_OUTPATIENT_CLINIC_OR_DEPARTMENT_OTHER): Payer: Medicare Other | Admitting: Transplant

## 2019-06-12 ENCOUNTER — Encounter (HOSPITAL_COMMUNITY): Payer: Self-pay

## 2019-06-12 DIAGNOSIS — C4A9 Merkel cell carcinoma, unspecified: Secondary | ICD-10-CM

## 2019-06-12 DIAGNOSIS — Z942 Lung transplant status: Secondary | ICD-10-CM

## 2019-06-12 DIAGNOSIS — D849 Immunodeficiency, unspecified: Secondary | ICD-10-CM

## 2019-06-12 NOTE — Progress Notes (Signed)
Lung Transplant Clinic Distant Site Telemedicine Encounter    I conducted this encounter from [facility] via secure, live, face-to-face video conference with the patient. Dakota Marsh was located at home with his daughter.  Prior to the interview, the risks and benefits of telemedicine were discussed with the patient and verbal consent was obtained.     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 His last (telephone) visit was 03/20/19 at which time he reported a skin cancer of the scalp ~01/2019 that was resected, but then a more recent protrusion of the skin posterior to the L ear. This had been biopsied locally ~2 weeks prior to my visit, with that pathology appearing most consistent with an infiltrating basal cell ca. He was referred to ENT in [place] and then to the [facility]. There was an initial plan for surgical resection, but after a repeat head/heck CT and review of the original pathology it was determined that this was a Merkel Cell Ca, and PET scan on 10/27 showed diffusely metastatic disease.     He has been doing poorly over past few weeks. His L neck/ear skin lesion is progressing with some black necrosis of the helix, severe pain, foul smelling drainage. The neck itself and L cheek have become more painful, swollen, tight, making it difficult to swallow. He was started by his PCP on Oxycontin 10mg  q12 scheduled + oxycodone 5mg  q4 PRN which has been helpful as long as he remembers the PRN dosing. Also some pain L > R flank, low back, and hips, and daughter feels that his balance is not as good, with gait more awkward. No falls, but she has noticed a large hematoma on his L thigh without obvious trauma. Remains on warfarin. Cognitively seems to be having some trouble with memory, occasional episodes of "blank stare". Weaker, and appetite has decreased, weight down to 148#.  He has appointments with derm/medical and radiation oncology tomorrow and plans on keeping these to learn about any potential treatment options. Wants to prioritize comfort and values ability to eat - the latter has been very challenging due to the L cheek mass.     No breathing problems. Denies SOB, cough/sputum, f/c/ns. No problems with constipation or abd pain, N/V.     A complete ROS was otherwise negative or as per hpi     PMH:  1. 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 Metastatic Merkel Cell carcinoma as above   - Also history of multiple other skin cancers for which he was following with dermatology in [facility]  3. GERD ([place] 7.0 06/2015, Shatzi ring s/p dilation 11/2014)  4. HTN  5. CAD (LHC 06/2015 with 50% distal RCA stenosis)  6. Depression/anxiety  7. Osteoporosis - T score -2.0 on DEXA 05/2015   - Repeat 05/2017 with T score -2.1  8. 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  Acetaminophen 325 MG Oral Tab, Take 650 mg by mouth every 6 hours as needed for pain. 1000mg  4x day every 5 hours, Disp: , Rfl:   .  atorvastatin 20 MG tablet, TAKE 1 TABLET BY MOUTH DAILY, Disp: 30 tablet, Rfl: 11  .  azithromycin 250 MG tablet, Take 1 tablet  (250 mg) by mouth daily., Disp: 30 tablet, Rfl: 11  .  escitalopram 10 MG tablet, take 1 tablet by mouth daily for 30 DAYS, Disp: , Rfl:   .  fluconazole 200 MG tablet, Take 1 tablet (200 mg) by mouth daily., Disp: 90 tablet, Rfl: 3  .  Gabapentin 300 MG Oral Cap, Take 3 capsules (900 mg) by mouth 3 times a day. (Patient taking differently: Take 600 mg by mouth 3 times a day. ), Disp: 270 capsule, Rfl: 0  .  lisinopril 10 MG tablet,  10 mg = 1 tab, PO, Daily, 04/26/19 15:41:48, Disp: , Rfl:   .  pantoprazole 40 MG EC tablet, Take 1 tablet (40 mg) by mouth daily., Disp: 30 tablet, Rfl: 11  .  predniSONE 5 MG tablet, Take 1 tablet (5 mg) by mouth daily., Disp: 30 tablet, Rfl: 11  .  sirolimus 0.5 MG tablet, Take 1 tablet (0.5 mg) by mouth every morning. Total dose now 1.5 mg every morning, Disp: 30 tablet, Rfl: 11  .  sulfamethoxazole-trimethoprim 400-80 MG tablet, Take 1 tablet by mouth daily., Disp: 30 tablet, Rfl: 11  .  tacrolimus 0.5 MG capsule, Take 2 mg by mouth daily with breakfast., Disp: , Rfl:   .  tacrolimus 0.5 MG capsule, Take 1.5 mg by mouth daily with dinner., Disp: , Rfl:   .  traMADol 100 MG tablet, Take 100 mg by mouth 4 times a day., Disp: , Rfl:   .  valACYclovir 1 g tablet,  1 gm, PO, Daily, 04/26/19 15:40:15, Disp: , Rfl:   .  warfarin 2 MG tablet, 2 mg., Disp: , Rfl:     ALLERGIES:  Codeine - anxiety/dyspnea    SH  Lives in [place] in a 5th wheel trailer. 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 No longer working since recent skin cancer issues. Daughter close by in [place].       PHYSICAL EXAM (on telemedicine)  There were no vitals taken for this visit.  General: He appears thinner today  HEENT: There appears to be necrosis of the helix of the L ear and diffuse swelling of the L face/neck. Skin appears tight with some nodularity posterior to the ear  Lungs: Breathing comfortably, normal WOB  Neuro: A/O x 4, grossly nonfocal though he is hard of hearing  today  Psych: Conversant, pleasant, normal affect      DATA:    Labs   All were reviewed, most recently -   FK 6.4  Sirol 8.1  Cr 1.3  K 5.4    Most recent PET/CT personally reviewed. Official interpretation -  *  Large markedly hypermetabolic centrally necrotic mass in the left parotid   gland extending into the left pinna represents biopsy proven basosquamous cell   carcinoma. Foci of  gas within this parotid mass may be due to superimposed   infection versus a draining sinus communicating with the exterior.   *  Moderately hypermetabolic lesion in the right posterior lateral scalp at   vertex represents biopsy proven invasive squamous cell carcinoma.  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   *  Multifocal FDG avid liver lesions and widespread hypermetabolic osseous   lesions in the entire axial and proximal appendicular skeleton are consistent   with metastases from the parotid malignancy. Please note that metastatic   involvement of the weightbearing bones such as bilateral proximal femurs in   this patient increases the risk for pathologic fractures.  *  Hypermetabolic right upper paratracheal lymph node demonstrates FDG uptake   per unit volume similar to the parotid mass and is consistent with nodal   metastasis. Remainder of the mediastinal and bilateral hilar nodes are much   less avid with FDG uptake per unit volume less than the parotid mass; these   nodes may represent a separate infectious/ inflammatory process versus   metastases from a different cell population.    Numerous FDG avid subcentimeter bilateral pulmonary nodules with mild FDG   avidity few of which have increased in size since prior CT chest dated   05/04/2019, while others are new since prior. Given low FDG uptake per unit    volume of these nodules and history of immunosuppression, these nodes may   represent a separate infectious/ inflammatory process versus metastases from a   different cell population.    Spirometry      R   FVC  %     FEV1   %      2575      %  03/22/2019 External  71  4.08  99% 2.74  90% 1.83  70%             12/13/2018 External  62  4.26  103% 2.63  86% 1.02  43%              06/03/2018 External  71  3.86  92% 2.73  88% 1.81  74%   12/08/2017 [place]  67  4.34  100% 2.92  86% 1.67  53%              06/09/2017 [place]  65  4.36  100% 2.84  83% 1.47  46%              02/17/2017 [place]  66  4.34  99% 2.86  84% 1.6  50%              10/28/2016 [place]  68  4.29  98% 2.92  86% 1.73  55%              10/02/2016 [place]  70  3.89  88% 2.71  83% 1.67  64%              08/31/2016 [place]  66  4.07  93% 2.68  79% 1.47  46%              05/13/2016 [place]  74  3.77  86% 2.79  81% 2.16  67%              02/26/2016 [place]  70  4.1  93% 2.86  84% 1.91  59%                  IMPRESSION/PLAN:  68 year old man s/p BOLT 09/05/15 for COPD (CMV+/+, EBV+/+)     1. Recent diagnosis of metastatic Merkel Cell Ca - This has unfortunately progressed very rapidly leading to severe local symptoms and the imaging findings above. I reviewed the current situation with Dakota Marsh and his daughter, as well as with Dr. Marrion Coy at [facility] and his PCP Cyndie Chime. Dakota Marsh and his family would like to prioritize comfort and are interested in looking into palliative options/hospice. He could use more support at home as well. They would like to keep their visits with [facility] tomorrow to learn more about his diagnosis and any potential palliative options.   - [facility] visits tomorrow. They are particularly interested in palliative radiation options for the pain and swelling of the L face/ear/neck which have made it challenging to eat. Hoping that these can be converted to telemedicine as travel is challenging in his current state.    - [place] will look into setting up home  hospice    2. Lung transplant, other medical issues - No pulmonary symptoms at this time   - Will DC tacrolimus given the above. Can continue sirolimus and prednisone for now    - With his large hematoma L leg, poor PO intake, history of variable INRs, and overall changes in goals as above, I recommended discontinuing his warfarin now. Cyndie Chime is in agreement.    - Can continue other treatments and prophylactics for now but will revisit pending plans in upcoming days to weeks

## 2019-06-13 ENCOUNTER — Ambulatory Visit (HOSPITAL_BASED_OUTPATIENT_CLINIC_OR_DEPARTMENT_OTHER): Payer: Medicare Other | Admitting: Physician Assistant

## 2019-06-13 ENCOUNTER — Other Ambulatory Visit: Payer: Self-pay

## 2019-06-13 ENCOUNTER — Other Ambulatory Visit: Payer: Self-pay | Admitting: Student in an Organized Health Care Education/Training Program

## 2019-06-13 ENCOUNTER — Ambulatory Visit: Payer: Medicare Other | Attending: Medical Oncology | Admitting: Medical Oncology

## 2019-06-13 ENCOUNTER — Inpatient Hospital Stay
Admission: EM | Admit: 2019-06-13 | Discharge: 2019-06-19 | DRG: 948 | Disposition: A | Discharge status: Home / Self Care | Payer: Medicare Other | Attending: Unknown Physician Specialty | Admitting: Unknown Physician Specialty

## 2019-06-13 ENCOUNTER — Inpatient Hospital Stay (HOSPITAL_COMMUNITY): Payer: Medicare Other | Admitting: Internal Medicine

## 2019-06-13 DIAGNOSIS — C4A9 Merkel cell carcinoma, unspecified: Secondary | ICD-10-CM | POA: Diagnosis present

## 2019-06-13 DIAGNOSIS — Z85828 Personal history of other malignant neoplasm of skin: Secondary | ICD-10-CM

## 2019-06-13 DIAGNOSIS — C4442 Squamous cell carcinoma of skin of scalp and neck: Secondary | ICD-10-CM

## 2019-06-13 DIAGNOSIS — H9222 Otorrhagia, left ear: Secondary | ICD-10-CM | POA: Diagnosis present

## 2019-06-13 DIAGNOSIS — B029 Zoster without complications: Secondary | ICD-10-CM | POA: Diagnosis present

## 2019-06-13 DIAGNOSIS — Z809 Family history of malignant neoplasm, unspecified: Secondary | ICD-10-CM

## 2019-06-13 DIAGNOSIS — N189 Chronic kidney disease, unspecified: Secondary | ICD-10-CM | POA: Diagnosis present

## 2019-06-13 DIAGNOSIS — G893 Neoplasm related pain (acute) (chronic): Secondary | ICD-10-CM

## 2019-06-13 DIAGNOSIS — I129 Hypertensive chronic kidney disease with stage 1 through stage 4 chronic kidney disease, or unspecified chronic kidney disease: Secondary | ICD-10-CM | POA: Diagnosis present

## 2019-06-13 DIAGNOSIS — H9192 Unspecified hearing loss, left ear: Secondary | ICD-10-CM | POA: Diagnosis present

## 2019-06-13 DIAGNOSIS — C7B1 Secondary Merkel cell carcinoma: Secondary | ICD-10-CM | POA: Diagnosis present

## 2019-06-13 DIAGNOSIS — R9431 Abnormal electrocardiogram [ECG] [EKG]: Secondary | ICD-10-CM

## 2019-06-13 DIAGNOSIS — Z942 Lung transplant status: Secondary | ICD-10-CM

## 2019-06-13 DIAGNOSIS — I2510 Atherosclerotic heart disease of native coronary artery without angina pectoris: Secondary | ICD-10-CM | POA: Diagnosis present

## 2019-06-13 DIAGNOSIS — M810 Age-related osteoporosis without current pathological fracture: Secondary | ICD-10-CM | POA: Diagnosis present

## 2019-06-13 DIAGNOSIS — J90 Pleural effusion, not elsewhere classified: Secondary | ICD-10-CM

## 2019-06-13 DIAGNOSIS — J449 Chronic obstructive pulmonary disease, unspecified: Secondary | ICD-10-CM

## 2019-06-13 DIAGNOSIS — R4182 Altered mental status, unspecified: Secondary | ICD-10-CM

## 2019-06-13 DIAGNOSIS — Z515 Encounter for palliative care: Secondary | ICD-10-CM | POA: Diagnosis present

## 2019-06-13 DIAGNOSIS — Z66 Do not resuscitate: Secondary | ICD-10-CM | POA: Diagnosis present

## 2019-06-13 DIAGNOSIS — Z20828 Contact with and (suspected) exposure to other viral communicable diseases: Secondary | ICD-10-CM

## 2019-06-13 DIAGNOSIS — R918 Other nonspecific abnormal finding of lung field: Secondary | ICD-10-CM

## 2019-06-13 DIAGNOSIS — Z885 Allergy status to narcotic agent status: Secondary | ICD-10-CM

## 2019-06-13 DIAGNOSIS — Z87891 Personal history of nicotine dependence: Secondary | ICD-10-CM

## 2019-06-13 DIAGNOSIS — Z79899 Other long term (current) drug therapy: Secondary | ICD-10-CM

## 2019-06-13 LAB — CBC, DIFF
% Basophils: 0 %
% Eosinophils: 0 %
% Immature Granulocytes: 2 %
% Lymphocytes: 15 %
% Monocytes: 10 %
% Neutrophils: 73 %
% Nucleated RBC: 0 %
Absolute Eosinophil Count: 0.01 10*3/uL (ref 0.00–0.50)
Absolute Lymphocyte Count: 1.04 10*3/uL (ref 1.00–4.80)
Basophils: 0.02 10*3/uL (ref 0.00–0.20)
Hematocrit: 31 % — ABNORMAL LOW (ref 38–50)
Hemoglobin: 9.8 g/dL — ABNORMAL LOW (ref 13.0–18.0)
Immature Granulocytes: 0.14 10*3/uL — ABNORMAL HIGH (ref 0.00–0.05)
MCH: 28.1 pg (ref 27.3–33.6)
MCHC: 31.4 g/dL — ABNORMAL LOW (ref 32.2–36.5)
MCV: 89 fL (ref 81–98)
Monocytes: 0.72 10*3/uL (ref 0.00–0.80)
Neutrophils: 5.09 10*3/uL (ref 1.80–7.00)
Nucleated RBC: 0 10*3/uL
Platelet Count: 160 10*3/uL (ref 150–400)
RBC: 3.49 10*6/uL — ABNORMAL LOW (ref 4.40–5.60)
RDW-CV: 13.4 % (ref 11.6–14.4)
WBC: 7.02 10*3/uL (ref 4.3–10.0)

## 2019-06-13 LAB — PROTHROMBIN & PTT
Partial Thromboplastin Time: 41 s — ABNORMAL HIGH (ref 22–35)
Prothrombin INR: 1.9 — ABNORMAL HIGH (ref 0.8–1.3)
Prothrombin Time Patient: 21.9 s — ABNORMAL HIGH (ref 10.7–15.6)

## 2019-06-13 LAB — COMPREHENSIVE METABOLIC PANEL
ALT (GPT): 19 U/L (ref 10–48)
AST (GOT): 67 U/L — ABNORMAL HIGH (ref 9–38)
Albumin: 3.5 g/dL (ref 3.5–5.2)
Alkaline Phosphatase (Total): 119 U/L (ref 36–161)
Anion Gap: 11 (ref 4–12)
Bilirubin (Total): 0.2 mg/dL (ref 0.2–1.3)
Calcium: 9.1 mg/dL (ref 8.9–10.2)
Carbon Dioxide, Total: 22 meq/L (ref 22–32)
Chloride: 101 meq/L (ref 98–108)
Creatinine: 1.43 mg/dL — ABNORMAL HIGH (ref 0.51–1.18)
Glucose: 155 mg/dL — ABNORMAL HIGH (ref 62–125)
Potassium: 5.6 meq/L — ABNORMAL HIGH (ref 3.6–5.2)
Protein (Total): 7.2 g/dL (ref 6.0–8.2)
Sodium: 134 meq/L — ABNORMAL LOW (ref 135–145)
Urea Nitrogen: 32 mg/dL — ABNORMAL HIGH (ref 8–21)
eGFR by CKD-EPI: 50 mL/min/{1.73_m2} — ABNORMAL LOW (ref >59)

## 2019-06-13 LAB — URINALYSIS WITH REFLEX CULTURE
Bacteria, URN: NONE SEEN
Bilirubin (Qual), URN: NEGATIVE
Epith Cells_Renal/Trans,URN: NEGATIVE /HPF
Epith Cells_Squamous, URN: NEGATIVE /LPF
Glucose Qual, URN: NEGATIVE mg/dL
Ketones, URN: NEGATIVE mg/dL
Leukocyte Esterase, URN: NEGATIVE
Nitrite, URN: NEGATIVE
Protein (Alb Semiquant), URN: NEGATIVE mg/dL
Specific Gravity, URN: 1.014 g/mL (ref 1.006–1.027)
WBC, URN: NEGATIVE /HPF

## 2019-06-13 LAB — SARS-COV-2 (COVID-19) QUALITATIVE RAPID PCR: COVID-19 Coronavirus Qual PCR Result: NOT DETECTED

## 2019-06-13 LAB — TROPONIN_I
Troponin_I Interpretation: NORMAL
Troponin_I: <0.03 ng/mL (ref <0.04)

## 2019-06-13 LAB — B_TYPE NATRIURETIC PEPTIDE: B_Type Natriuretic Peptide: 476 pg/mL — ABNORMAL HIGH (ref <101)

## 2019-06-14 ENCOUNTER — Encounter (HOSPITAL_BASED_OUTPATIENT_CLINIC_OR_DEPARTMENT_OTHER): Payer: Self-pay

## 2019-06-14 LAB — EKG 12 LEAD
Atrial Rate: 86 {beats}/min
Diagnosis: NORMAL
P Axis: 19 degrees
P-R Interval: 150 ms
Q-T Interval: 392 ms
QRS Duration: 110 ms
QTC Calculation: 469 ms
R Axis: 14 degrees
T Axis: 188 degrees
Ventricular Rate: 86 {beats}/min

## 2019-06-14 LAB — REFLEX CULTURE FOR UA

## 2019-06-15 ENCOUNTER — Ambulatory Visit (HOSPITAL_BASED_OUTPATIENT_CLINIC_OR_DEPARTMENT_OTHER): Payer: Medicare Other

## 2019-06-15 ENCOUNTER — Ambulatory Visit (HOSPITAL_BASED_OUTPATIENT_CLINIC_OR_DEPARTMENT_OTHER): Payer: Medicare Other | Admitting: Radiation Oncology

## 2019-06-15 ENCOUNTER — Other Ambulatory Visit: Payer: Self-pay | Admitting: Radiation Oncology

## 2019-06-15 ENCOUNTER — Other Ambulatory Visit (HOSPITAL_BASED_OUTPATIENT_CLINIC_OR_DEPARTMENT_OTHER): Payer: Self-pay | Admitting: Internal Medicine

## 2019-06-15 DIAGNOSIS — G893 Neoplasm related pain (acute) (chronic): Secondary | ICD-10-CM

## 2019-06-15 DIAGNOSIS — C4A22 Merkel cell carcinoma of left ear and external auricular canal: Secondary | ICD-10-CM

## 2019-06-15 DIAGNOSIS — Z942 Lung transplant status: Secondary | ICD-10-CM

## 2019-06-15 DIAGNOSIS — C799 Secondary malignant neoplasm of unspecified site: Secondary | ICD-10-CM

## 2019-06-15 DIAGNOSIS — B029 Zoster without complications: Secondary | ICD-10-CM

## 2019-06-15 DIAGNOSIS — Z48298 Encounter for aftercare following other organ transplant: Secondary | ICD-10-CM

## 2019-06-15 DIAGNOSIS — C7B1 Secondary Merkel cell carcinoma: Secondary | ICD-10-CM

## 2019-06-15 DIAGNOSIS — Z515 Encounter for palliative care: Secondary | ICD-10-CM

## 2019-06-15 LAB — URINE C/S: Culture: NO GROWTH

## 2019-06-16 ENCOUNTER — Inpatient Hospital Stay (HOSPITAL_COMMUNITY): Admission: RE | Admit: 2019-06-16 | Payer: Medicare Other | Source: Home / Self Care | Admitting: Otolaryngology

## 2019-06-16 ENCOUNTER — Other Ambulatory Visit: Payer: Self-pay | Admitting: Radiation Oncology

## 2019-06-16 ENCOUNTER — Ambulatory Visit (HOSPITAL_BASED_OUTPATIENT_CLINIC_OR_DEPARTMENT_OTHER): Payer: Medicare Other

## 2019-06-16 ENCOUNTER — Ambulatory Visit (HOSPITAL_BASED_OUTPATIENT_CLINIC_OR_DEPARTMENT_OTHER): Payer: Medicare Other | Admitting: Radiation Oncology

## 2019-06-16 DIAGNOSIS — C4A9 Merkel cell carcinoma, unspecified: Secondary | ICD-10-CM

## 2019-06-16 DIAGNOSIS — C7B1 Secondary Merkel cell carcinoma: Secondary | ICD-10-CM

## 2019-06-17 DIAGNOSIS — M545 Low back pain: Secondary | ICD-10-CM

## 2019-06-17 DIAGNOSIS — C4A9 Merkel cell carcinoma, unspecified: Secondary | ICD-10-CM

## 2019-06-17 DIAGNOSIS — R110 Nausea: Secondary | ICD-10-CM

## 2019-06-17 DIAGNOSIS — H9209 Otalgia, unspecified ear: Secondary | ICD-10-CM

## 2019-06-19 ENCOUNTER — Other Ambulatory Visit (HOSPITAL_COMMUNITY): Payer: Self-pay | Admitting: Student in an Organized Health Care Education/Training Program

## 2019-06-19 ENCOUNTER — Ambulatory Visit (HOSPITAL_BASED_OUTPATIENT_CLINIC_OR_DEPARTMENT_OTHER): Payer: Medicare Other

## 2019-06-19 ENCOUNTER — Other Ambulatory Visit (HOSPITAL_COMMUNITY): Payer: Self-pay | Admitting: Unknown Physician Specialty

## 2019-06-19 ENCOUNTER — Ambulatory Visit (HOSPITAL_BASED_OUTPATIENT_CLINIC_OR_DEPARTMENT_OTHER): Payer: Self-pay

## 2019-06-19 ENCOUNTER — Encounter (HOSPITAL_BASED_OUTPATIENT_CLINIC_OR_DEPARTMENT_OTHER): Payer: Medicare Other | Admitting: Radiation Oncology

## 2019-06-19 DIAGNOSIS — C4A9 Merkel cell carcinoma, unspecified: Secondary | ICD-10-CM

## 2019-06-19 DIAGNOSIS — C7B1 Secondary Merkel cell carcinoma: Secondary | ICD-10-CM

## 2019-06-19 MED ORDER — SIROLIMUS 0.5 MG OR TABS
ORAL_TABLET | ORAL | 0 refills | Status: AC
Start: 2019-06-19 — End: 2020-06-18

## 2019-06-19 MED ORDER — PANTOPRAZOLE SODIUM 40 MG OR TBEC
DELAYED_RELEASE_TABLET | ORAL | 0 refills | Status: AC
Start: 2019-06-19 — End: 2020-06-18

## 2019-06-19 MED ORDER — ZOLPIDEM TARTRATE 5 MG OR TABS
ORAL_TABLET | ORAL | 0 refills | Status: AC
Start: 2019-06-19 — End: 2019-12-16

## 2019-06-19 MED ORDER — SENNOSIDES 8.6 MG OR TABS
ORAL_TABLET | ORAL | 0 refills | Status: AC
Start: 2019-06-19 — End: 2020-06-18

## 2019-06-19 MED ORDER — ACETAMINOPHEN 325 MG OR TABS
ORAL_TABLET | ORAL | 0 refills | Status: AC
Start: 2019-06-19 — End: 2020-06-18

## 2019-06-19 MED ORDER — POLYETHYLENE GLYCOL 3350 17 GM/SCOOP OR POWD
ORAL | 0 refills | Status: AC
Start: 2019-06-19 — End: 2020-06-18

## 2019-06-19 MED ORDER — MAGNESIUM CITRATE 1.745 GM/30ML OR SOLN
ORAL | 0 refills | Status: AC
Start: 2019-06-19 — End: 2020-06-18

## 2019-06-19 MED ORDER — HYDROMORPHONE HCL 2 MG OR TABS
ORAL_TABLET | ORAL | 0 refills | Status: AC
Start: 2019-06-19 — End: 2019-12-16

## 2019-06-19 MED ORDER — PREDNISONE 5 MG OR TABS
ORAL_TABLET | ORAL | 0 refills | Status: AC
Start: 2019-06-19 — End: 2020-06-18

## 2019-06-19 MED ORDER — VENLAFAXINE HCL 100 MG OR TABS
ORAL_TABLET | ORAL | 0 refills | Status: AC
Start: 2019-06-19 — End: 2020-06-18

## 2019-06-19 MED ORDER — PREGABALIN 25 MG OR CAPS
ORAL_CAPSULE | ORAL | 0 refills | Status: AC
Start: 2019-06-19 — End: 2019-12-16

## 2019-06-19 MED ORDER — CARBOXYMETHYLCELLULOSE SODIUM 0.5 % OP SOLN
OPHTHALMIC | 0 refills | Status: AC
Start: 2019-06-19 — End: 2020-06-18

## 2019-06-19 MED ORDER — BISACODYL 10 MG RE SUPP
RECTAL | 0 refills | Status: AC
Start: 2019-06-19 — End: 2020-06-18

## 2019-06-19 MED ORDER — MORPHINE SULFATE ER 30 MG OR TBCR
EXTENDED_RELEASE_TABLET | ORAL | 0 refills | Status: AC
Start: 2019-06-19 — End: 2019-12-16

## 2019-06-19 MED ORDER — PROCHLORPERAZINE MALEATE 10 MG OR TABS
ORAL_TABLET | ORAL | 0 refills | Status: AC
Start: 2019-06-19 — End: 2020-06-18

## 2019-06-22 ENCOUNTER — Encounter (HOSPITAL_BASED_OUTPATIENT_CLINIC_OR_DEPARTMENT_OTHER): Payer: Medicare Other | Admitting: Otolaryngology

## 2019-06-30 ENCOUNTER — Encounter (HOSPITAL_BASED_OUTPATIENT_CLINIC_OR_DEPARTMENT_OTHER): Payer: Self-pay | Admitting: Radiation Oncology

## 2019-06-30 NOTE — End Of treatment Summary (Signed)
Dakota Marsh   2/1        [facility] Radiation Oncology Treatment Summary Note     Identifying History:  68 yo with widely metastatic Merkel Cell Carcinoma     Treatment Summary:  Radiation Oncology - Course: 2     Protocol:   Treatment Site Current Dose   Modality  From To   Elapsed Days   Fx.  Left parotid/neck    1,600 cGy          06/15/2019     06/16/2019       1   2  sacrum       800 cGy          06/19/2019     06/19/2019           1                          Total Dose: 1,600 to left neck, 800 to sacrum        Treatment Intent:    palliative        Protocol:             Concurrent Chemotherapy: no        Treatment Tolerance/Response: too soon to tell.          Disposition:  Patient to home hospice.           cc:  Aniche, Eberechukwu O.

## 2019-07-07 ENCOUNTER — Inpatient Hospital Stay: Payer: Self-pay

## 2019-07-07 ENCOUNTER — Other Ambulatory Visit (HOSPITAL_BASED_OUTPATIENT_CLINIC_OR_DEPARTMENT_OTHER): Payer: Self-pay | Admitting: Transplant

## 2019-07-07 DIAGNOSIS — Z942 Lung transplant status: Secondary | ICD-10-CM

## 2019-07-07 NOTE — ED Triage Notes (Addendum)
 68 yo male presents to ED via ambulance with complaints of fall and head injury with LOC. Pt hospice nurse states he was outside and tripped over some bricks, hit the R side of his head on the gravel. Pt denies taking blood thinners, pt is on hospice for cancer. Pt has contusion and abrasions on the R side of his head and two finger lacerations on the L hand; one on his middle finger above the fingernail and one on the palm side of the L pinky. Pt also has a laceration on his R elbow. Pt rates his pain 4/10 with it hurting the most in the center of his back. Pt states that he is also having general discomfort in his chest area, pt does not think it is heart related but thinks it is stress from the fall. Pt is AOx4, GCS 15.

## 2019-07-07 NOTE — ED Notes (Signed)
Pt finger wounds cleaned up more and applied bacitracin with zinc to wounds. Wrapped L middle finger wound with pressure wrap due to wound slowly seeping blood out. Wound on pt L pinky wrapped with a band-aid. Pt tolerated without complaints of pain.

## 2019-07-07 NOTE — ED Provider Notes (Signed)
 History     Chief Complaint   Patient presents with   . Fall   . Head Injury (With Loc)     HPI: 68 year old male, was brought into ER by EMS on 07/07/2019 at 1536 because of head injury and chest discomfort.    The patient states that he was outside, tripped over some bricks, hit the right side of his forehead on the gravel prior to ER. He has pain, swelling, and bruising developed in the injured area. He thinks that he has transient loss of consciousness. He has laceration to his right elbow lateral aspect, skin tear to left middle finger nailfold, and left 5th finger middle phalanx palmar aspect, with blood oozing.    He has chronic back pain, and rates his pain 4/10 with it hurting the most in the center of his back. He is also having general discomfort in his chest area, he does not think it is heart related but thinks it is stress from the fall.    He denies taking blood thinners. Currently he is in hospice for cancer. He has h/o COPD s/p BOLT 2017, CAD, CKD, hypertension, hyperlipidemia, chronic anemia. 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    He has no blurred vision. No slurred speech, no vomiting. No weakness or numbness over a typical part of bodies or extremities.    PTA Home Medications   Medication Sig   . atorvaSTATin (LIPITOR) 20 mg tablet TK 1 T PO  D   . Calcium 500 MG TABS Take 500 mg by mouth Daily.   . fluconazole (DIFLUCAN) 200 MG tablet Take 200 mg by mouth Daily.   . furosemide (LASIX) 20 mg tablet Take 20 mg by mouth Daily.   Marland Kitchen gabapentin (NEURONTIN) 600 MG tablet Take 1 tablet by mouth 3 times daily.   Marland Kitchen lisinopril (PRINIVIL, ZESTRIL) 10 mg tablet Take 1 tablet by mouth Daily.   . [EXPIRED] oxyCODONE (OXYCONTIN) 10 mg ER abuse-deterrent tablet Take 1 tablet by mouth every 12 hours for 30 days.   . [EXPIRED] oxyCODONE (ROXICODONE) 5 mg tablet Take 1 tablet by mouth every 4  hours as needed for Pain for up to 30 days.   . pantoprazole (PROTONIX) 40 mg tablet TK 1 T PO  D   . predniSONE (DELTASONE) 5 mg tablet TK 1 T PO QD   . sirolimus (RAPAMUNE) 0.5 mg tablet Take one tablet by mouth along with 1 mg tablet daily for total of 1.5 mg daily.   . sirolimus (RAPAMUNE) 1 mg tablet Take one tablet by mouth along with 0.5 mg tablet daily for a total of 1.5 mg   . sodium polystyrene (KAYEXALATE) 15 g/60 mL suspension Take 15 g by mouth.   . sodium polystyrene (KAYEXALATE) powder MIX as directed and take 15 grams once daily for 3 days   . tacrolimus (PROGRAF) 0.5 mg capsule TK ONE C PO QPM   . tacrolimus (PROGRAF) 1 mg capsule TK 2 CS PO Q 12 H   . valACYclovir (VALTREX) 1 g tablet TK 1 T PO QD   . warfarin (COUMADIN) 2 mg tablet TK 1 T PO QD       He is allergic to codeine..     Past Medical History:   Diagnosis Date   . Abnormal PFTs (pulmonary function tests)    . Atherosclerotic heart disease    . Chronic anticoagulation    .  COPD (chronic obstructive pulmonary disease) (HCC)    . Depression    . Dyslipidemia    . GERD without esophagitis    . Hypertension    . Immunosuppression (HCC)    . Iron deficiency anemia    . Lung transplant status (HCC)    . Osteoporosis    . Skin cancer        Past Surgical History:   Procedure Laterality Date   . APPENDECTOMY     . LUNG SURGERY         History reviewed. No pertinent family history.    Social History     Socioeconomic History   . Marital status: Widowed     Spouse name: Not on file   . Number of children: Not on file   . Years of education: Not on file   . Highest education level: Not on file   Tobacco Use   . Smoking status: Former Smoker     Quit date: 2005     Years since quitting: 15.9   . Smokeless tobacco: Never Used   Substance and Sexual Activity   . Alcohol use: Yes     Comment: occasional   . Drug use: Not Currently     Types: Marijuana     Comment: edibles, rare     Social Determinants of Health     Financial Resource Strain:    .  Difficulty of Paying Living Expenses: Not on file   Food Insecurity:    . Worried About Programme researcher, broadcasting/film/video in the Last Year: Not on file   . Ran Out of Food in the Last Year: Not on file   Transportation Needs:    . Lack of Transportation (Medical): Not on file   . Lack of Transportation (Non-Medical): Not on file   Physical Activity:    . Days of Exercise per Week: Not on file   . Minutes of Exercise per Session: Not on file   Stress:    . Feeling of Stress : Not on file   Social Connections:    . Frequency of Communication with Friends and Family: Not on file   . Frequency of Social Gatherings with Friends and Family: Not on file   . Attends Religious Services: Not on file   . Active Member of Clubs or Organizations: Not on file   . Attends Banker Meetings: Not on file   . Marital Status: Not on file   Intimate Partner Violence:    . Fear of Current or Ex-Partner: Not on file   . Emotionally Abused: Not on file   . Physically Abused: Not on file   . Sexually Abused: Not on file       Review of Systems   GENERAL: No fever.   NEUROLOGIC: No syncope, or seizure  HEENT: No vision acuity or field change. No nose bleeding or discharge. No sore throat, trouble speaking, or swallowing.  CV: No palpation  RESPIRATORY: No SOB, cough, hemoptysis.  GASTROINTESTINAL: No abdominal pain, vomiting, or GI bleeding.  GENITOURINARY: No dysuria, difficulty voiding. No urine or stool incontinence.    Physical Exam   BP (!) 191/119   Pulse 102   Temp 36.8 C (98.2 F) (Oral)   Resp 18   Ht 1.727 m (5\' 8" )   Wt 72.6 kg (160 lb)   SpO2 93%   BMI 24.33 kg/m     Physical Exam   GENERAL: Patient is alert, awake, oriented  x 3, not in apparent respiratory distress. He can communicate well and answer the questions appropriately  HEENT: Large patchy bruising, edema, erythema lump about 5 x 5 cm at right forehead. Several areas of the scalp with erythematous patches/macules with erosion and hemorrhagic crust. No  evidence of eye injury. Mild conjunctival pallor. Anicteric sclerae. Bilateral vision acuity and field are equal and intact. left ear with dressing cover, no oozing or drainage. Right ear canal no bleeding. Bilateral nasal canal patent, no discharge, no bleeding. Oropharynx clear and no evidence of intraoral injury.  NECK: Supple. The trachea is midline. No JVD. No cervical spinal process tenderness.  CV: RRR, no murmur  LUNG: Decreasing air movement at base, no wheezing, or crackles  CHEST WALL: No lesion. No deformity. Mild tenderness at chest wall  ABDOMEN: Soft, NT, ND, BS normal  BACK: Straight. No deformity, erythema, or edema. No thoracic or lumbar spinal process tenderness  EXTREMITIES: No deformity. He can move all 4 extremities well  NEUROLOGIC: Glasgow Coma Scale is 15/15. He can bear weight and ambulate without assistance  SKIN: 2 cm superficial laceration at right elbow lateral aspect without active bleeding. Skin tear at left middle finger nailfold, and left 5th finger middle phalanx palmar aspect, with blood oozing.    CXR: 1. There are interstitial infiltrative subcarinal lung fields with trace pleural effusions.2. Normal cardiac size. 3. No evidence of ribs fracture    ED Course   The ER nurse talked to the hospice nurse and family. The family does not want any interventions beside the necessary treatment because the patient is in hospice. If he has extensive evaluation and treatment, he has to be discharged off hospice and wait to get back into hospice later. Family and patient agree to chest xray and wound cleaning and possible closure if needed only. Declined further evaluation like head CT. This MD was awaring and understanding.    The wounds were cleaned with sterile normal saline and Hibiclens. He tolerated well without complaints of pain. The applied triple antibiotic ointment and covered with large band-aids.    Vitals with Comments 05/08/2019 06/07/2019 07/07/2019 07/07/2019    SYSTOLIC 144 138 161 191   DIASTOLIC 72 88 112 119   Pulse 96 97 91 102   Temp 96.9 99.1 98.2 -   Resp 14 14 18 18    Weight 160 lbs 147 lbs 3 oz 160 lbs -   Height - - 5\' 8"  -   SPO2 96 98 98 93   BMI - - 24.4 kg/m2 -     Final diagnoses:   Head injury, acute, with loss of consciousness, initial encounter (HCC)   Forehead contusion, initial encounter - Right   Elbow laceration, right, initial encounter   Skin tear of hand without complication, initial encounter - Left middle finger nailfold and left 5th finger   Essential hypertension - Uncontrolled       Plan:  Enough fluid intake daily, use electrolyte for hydration  Keep the wound dry and clean.  Apply a thin level of triple antibiotic cream with zinc oxide to the wound two to three times until healed.  Tylenol 500 mg every 4 hours with food as needed for the pain, no more than 3000 mg per day  Importance to take and adjust blood pressure medications to keep blood pressure control <130/80  Rule of Rest, Ice, Compression, and Elevation.  Fall and injury precaution  Return back to ER if having worsening headache, confusion, mental status  change, dizziness; chest pain, shortness of breath, constant vomiting, or any concerns         Oneal Deputy, MD  07/08/19 1224

## 2019-07-07 NOTE — ED Notes (Signed)
 Medications given in ED:  None.    Written and verbal discharge instructions provided to pt. Pt verbalized understanding of discharge instructions. Pt discharged home with fmaily and is ambulatory. Pt in stable condition.

## 2019-07-10 MED ORDER — SULFAMETHOXAZOLE-TRIMETHOPRIM 400-80 MG OR TABS
1.0000 | ORAL_TABLET | Freq: Every day | ORAL | 11 refills | Status: AC
Start: 2019-07-10 — End: ?

## 2019-07-10 NOTE — Telephone Encounter (Signed)
 Pt was seen in the ER for a Fall/ Head Injury w/LOC.    Called for ER f/u.  No answer, left message.

## 2019-07-12 NOTE — Telephone Encounter (Signed)
Letter Sent.

## 2019-08-11 DEATH — deceased

## 2019-08-29 ENCOUNTER — Other Ambulatory Visit (HOSPITAL_BASED_OUTPATIENT_CLINIC_OR_DEPARTMENT_OTHER): Payer: Self-pay | Admitting: Transplant

## 2019-08-29 ENCOUNTER — Encounter (HOSPITAL_BASED_OUTPATIENT_CLINIC_OR_DEPARTMENT_OTHER): Payer: Self-pay

## 2019-08-29 DIAGNOSIS — Z942 Lung transplant status: Secondary | ICD-10-CM

## 2019-11-02 ENCOUNTER — Other Ambulatory Visit (HOSPITAL_BASED_OUTPATIENT_CLINIC_OR_DEPARTMENT_OTHER): Payer: Self-pay

## 2019-11-03 ENCOUNTER — Encounter (HOSPITAL_COMMUNITY): Payer: Self-pay

## 2019-11-04 NOTE — Historical Transplant Studies (Signed)
Elvia, Biehle [id number])  Clinical Events  ____________________________________________________________________________________    Date: 08/31/2016  Area: Airway Stenosis  Type: Bronchoscopy and dilation without stent   ____________________________________________________________________________________    Date: 10/02/2015  Area: Airway Stenosis  Type: Bronchoscopy and dilation without stent

## 2019-11-04 NOTE — Historical Transplant Communications (Signed)
Taysean, Wager ([id number])  Transplant Communications  ____________________________________________________________________________________    Date: 10/25/2015  Type: Medical info  Entry By: Derrick Ravel  Medical info on 10/25/2015 for Dosch, Forest Gleason [phone]    Message from [place].  Referral to psych in process.    I had a very long conversation with Kem Wiers's daughter this evening. She had helpful information and insight to share about her dad and theories about why he is struggling. Rather than sharing those details, I think the more informative and helpful approach is to tell you about the conversation I had with Francee Piccolo after I spoke with his daughter.     I did tell Shaunte I'd talked to his daughter "only briefly" but did not mention the email we received from his care giver. I also told him we had heard from rehab that he didn't make it to his intake appointment today, this being the real reason for my call.   I can lie like the best of them.     Samrat of course gave me a glowing account of how he's doing and how great his care givers are. Rather than confront him, I talked about his crazy-quick experience and how difficult it can be sometimes for those who get the call before they've even had a chance to settle into the idea of being on the wait list. He said, "Yeah,  maybe even before they've decided if they really even want to have a transplant." Hmmmmm    I also discussed how I've noticed the people who don't have the opportunity to talk with recipients before transplant tend to struggle more when their post-transplant experience doesn't match their pre-transplant expectations, medical and emotional.  This seemed to resonate.     Monti eventually fessed up to having a hard time right now due to the unexpected set back/readmission and feeling a loss of independence. I assured him he is not alone and that all transplant patients struggle in one way or another after transplant, many far worse than others.  I  told Darsh I would get some folks to reach out to him and insisted he come to the next group where he will hear how others also hate being told what to do and having to ask others for help "when they never had to do this before."  I told him he will hear how others have leg weakness, don't like to do home spirometry, hate taking so many pills, hallucinated in the hospital, and even needed to have their sternums repaired!  The list of his "no one ever told me" goes on and on but you get the point.     Dunbar said he doesn't think he's depressed. He said he has never done well in "this type of weather" but he doesn't feel depressed. When I suggested he has been through quite a lot with losing his wife and now transplant he started sobbing. Yep. No depression there.     Awais said he feels like everyone who is providing support to him has a different idea of the "do's and don'ts" of life after transplant. I'm guessing this is contributing to his feeling of powerlessness and resignation.   I clarified that he in fact can go to [place], or other places where there are people. The only time he needs to wear a mask is when he walks into the hospital/clinic or in close proximity of hordes of people (OK, that probably rules out [employer].) I emphasized the importance of  hand washing and staying away from sick people to avoid infection.  He may need a refresher course on how to live outside a bubble after a lung transplant.     His explanation for missing his rehab appointment is that he failed to notice the cover letter, with the intake dates listed on it,   enclosed in the rehab welcome packet.  Not until they called him did he even know there was a cover letter containing this information.  His care giver could not take him on such short notice so he didn't go. I asked why he didn't take a taxi or [place] and he said, "Do they take food stamps?" He then said it didn't occur to him to look for alternative means of transport.  I  guess the next intake date isn't until the third week in April.     Aland said he understands why sitting on the couch, watching TV, being waited on are not requirements or even suggestions listed in the post-transplant handbook. I told him the only way to make weak legs stronger is to exercise, rehab or no rehab.      I promised him that one way or another he will overcome these challenges but that we would prefer he overcome them in an alive way, as would his family and his donor's family.     He definitely needs to see psych and his care giver(s) need to be present for his clinic appointments to make sure he's not turning on the charm to get out of telling the truth.     On a very funny note, his daughter suggested I use a polygraph test as part of my interview process.     Let me know if you have any questions,     Angela     ____________________________________________________________________________________    Date: 09/06/2015  Entry By: Cheron Schaumann [id number]    Recv'd faxed crossmatch report from [facility].  Negative.   Faxed report to Thoracic Surgery inpatient team on 5SA and gave report to post tx team to upload in [facility].  ____________________________________________________________________________________    Date: 09/05/2015  Type: Action  Entry By: Burman Nieves  [id number]    Updated SRTR (January 2017) patient letter mailed  ____________________________________________________________________________________    Date: 09/04/2015  Type: Email message  Entry By: Lucky Cowboy  Lung Transplant Early Alert for Leta Jungling [phone])    UNOS Donor ID:  [id number]    Calling Jaja in for possible bilateral lung transplant - tentative OR for 09/04/12 at 1000.    Thank you  Kim  ____________________________________________________________________________________    Date: 09/03/2015  Entry By: Cheron Schaumann [phone]    Nicholes Mango relocated to [place], [place], while awaiting  transplant.   Please add a temporary address for him in MAX:  [address] [place] [address]    Thanks!  Amy  ____________________________________________________________________________________    Date: 09/03/2015  Entry By: Cheron Schaumann [phone]    I just activated Romulo on the transplant list.    Thanks,   Amy  ____________________________________________________________________________________    Date: 08/21/2015  Entry By: Shawnie Pons [phone]    Jayzen called today. He closed his house up and got on the road only to run into severe winter weather which apparently gets worse as one travels [place].  He's currently in [place], [place], staying with  his son. He didn't have a [place] but agreed to call as soon as he does.   He also let the pulm rehab program in [place] know that he's en route and will start rehab as soon as he gets settled.   He is waiting to get one signature from a care giver before he returns his paperwork.   Thanks   ____________________________________________________________________________________    Date: 08/01/2015  Type: Action  Entry By: Coy Saunas  Action on 08/01/2015 for Babers, Forest Gleason [phone]    I talked to both Francee Piccolo and his daughter, Kenney Houseman, regarding our recommendation for him to be seen by Dr. Godfrey Pick for consultation regarding his depression.     Both stated good understanding of the reason for the referral  - it was actually brought up by his daughter - as she thought this would be helpful for him and brought it to our attention that he does have a history of depression.  Mr. Royce is not aware that his daughter discussed this with [place] directly.  He was very receptive to being seen.    I also encouraged Jadd to attend Lung Transplant Support Group meetings monthly once he moves closer to [facility]) - explained all the reasons why it could be very helpful for him to  attend.  He was also very receptive to this recommendation.      ____________________________________________________________________________________    Date: 08/01/2015  Entry By: Cheron Schaumann [phone] -   Mr. Townsel has a significant history of depression, is on an anti-depressant as well.   Please place a referral for him to be seen by Dr. Godfrey Pick, Transplant Psychiatry.   On the referral please state he is approved to be listed for lung transplant with a significant history of depression, with an upcoming anniversary date of the death of his wife.  Please see for consultation with recommendations, see Organ Pre-Transplant Psychiatric Screening on 04/03/2015, in MAX.    Please try to coordinate this consultation visit with his next [facility] visit in February, if possible.  He is busy moving this month.     Mr. Manlove is aware of this referral.     Thanks!  Amy  ____________________________________________________________________________________    Date: 07/31/2015  Entry By: Coy Saunas    Sounds good!    Thanks,  Kylen, Schliep [id number]    Doroteo Bradford,     Mr. Laguardia's daughter called.  She wanted me to review his committee summary with her - which I did in detail.     She asked why he was never seen by a psychiatrist during his work up - she thought she read somewhere that it is required. (Not sure why she is just now asking, vs during his evaluation).  I explained it is not required unless a need is identified.  She said, "Well, he has anxiety and is on an antidepressant".     I explained we do screening and I would follow up on it.     After I got off the phone with her, I looked at his Organ Pre-Transplant Psychiatric Screening completed on 04/03/2015 - in [place].     Looks like he does meet criteria for a referral to be seen by Dr. Godfrey Pick.     Do you concur?  If so, we could put in a referral and try to coordinate the consult with his  appointments in February.    I think we  are more aware of this form now!    Thanks,   Amy    ____________________________________________________________________________________    Date: 07/31/2015  Entry By: Cheron Schaumann [phone]    Doroteo Bradford,     Also, do you have any pulmonary provider in [place] that you would recommend for Mr. Sage?    He will be living in [place] while awaiting transplant and it is more convenient than him coming all the way to [facility] to be followed in our [place] clinic, I think.     Thoughts?    Thanks!  Amy  ____________________________________________________________________________________    Date: 07/31/2015  Entry By: Cheron Schaumann [id number]    Beaulah Corin,   Conroy's daughter, Kenney Houseman, called.  She stated that her dad called her and informed her he was on the transplant list, was taken off, but now he is back on it.     I talked to her at length, reviewed the entire committee summary.  I explained he was never listed.  Maybe he was confused with the statement that he was approved for listing, but "some things need to be done before he is on the list".   I also explained that his case was presented back in November, but there was a concern for coronary artery disease raised and that needed to be re-evaluated by a cardiologist - which is why he was seen in the cardiology clinic this week - and that the good new is he does not require any procedures or further testing on his heart and we can proceed with listing him once we have his paperwork (explained in detail all the paperwork pending) and he needs to relocate as he is planning.     We discussed him needing to establish both pulmonary and primary care in the area when he moved.  Suggested they make sure he has at least 2 -3 months of medications prior to moving so he doesn't run out of any before his new providers can see him.      She asked if there are any providers we recommend since she will be helping him with this. Explained he should first  see if his insurance has any restrictions or preferred providers.  From there, if she would like to contact us to see if we recommend certain providers we can ask our physicians.      She asked me why he was not seen by a psychiatrist during his eval.  I explained we do screen patients, it is not necessarily required.  Kenney Houseman states he is on an antidepressant and has some anxiety - she thinks it might be beneficial.     I looked in MAX and he did complete the psych screening tool.  It does look like he should be referred and seen by Dr. Godfrey Pick. I'll run that by Doroteo Bradford since it was not part of his committee review.     I asked if Kenney Houseman would like to be included in our conversations with her dad.  She said we could offer that to him - if he would like it.  She has been very involved in his care, his evaluation process, attending his appointments etc.  She said he manages well, but when a lot of information is "dumped" on him it can be overwhelming.     Thanks,   Amy  ____________________________________________________________________________________    Date: 07/31/2015  Entry By:  Dakwon, Wenberg [id number]    Heather -   Just FYI.Marland KitchenMarland KitchenWe received Mazin's flu vaccine record. I put it in Nancy's file for her to enter in EPIC!    Thanks,   Amy  ____________________________________________________________________________________    Date: 07/31/2015  Entry By: Annalee Genta [phone]    Committee summary attached. F/U plan from 07/10/2015 already in place.    I spoke with Dorse to let him know that he was approved pending completion and review of a few items. He was very happy and appreciative of Dr. Les Pou cardiology consultation.    Since his last committee summary on November 30th, he received his flu shot---records requested.    Levada Dy confirmed she did not have his paperwork. He informed me that his plans changed to utilizing a relative's 5th wheel in [place], [place] instead of living with  his daughter in [place]. He prefers this as it will be closer and he wants to establish care here. I will re-discuss the [facility] clinic with him as it may be the closest option.  I instructed him to call Levada Dy and inform her of the change and to have his paperwork reflect this change in plans. We discussed his new insurance (Medicare with [employer]) that will be able to be verified on 08/12/2014; he may have to wait until then to establish new providers. I will also initiate a referral for pulmonary rehab in [place].    thanks, Heather    ____________________________________________________________________________________    Date: 07/31/2015  Entry By: Annalee Genta [phone]    I don't have it.    *************************************  HI Levada Dy-    Did you receive Stanislaus's caregiver paperwork? I will call him today to let him know he is approved; not sure if he has moved to [facility] to be with his daughter yet. His insurance will be changing so he will be verified on 08/13/2015 and can be listed then!    Let me know and thanks!  Heather  ____________________________________________________________________________________    Date: 07/22/2015  Entry By: Annalee Genta [id number]    HI Carren Rang is scheduled to get his #2 twinrix with you at 11:30 on December 19th; he is scheduled to see cardiology at 14:00.     Can you give it any later so he does not have a 2.5 hour gap between things? I told him I would ask, so just let me know and I will let him know.    thanks, Heather  ____________________________________________________________________________________    Date: 07/12/2015  Entry By: Rella Larve [phone]    Linkon has been scheduled for his cardiology consult on 07/29/15. I called him and added his 2nd twinrix vaccine that day as well.  ____________________________________________________________________________________    Date: 07/10/2015  Entry By: Annalee Genta [phone]    Committee Summary attached.     Tyri was discussed and is deferred as he needs a cardiology consult. He has a solid caregiver plan and will be relocating to [facility] with daughter.  He will next call Dorthea Cove about his Medicare plan so he can arrange for a PCP and pulmonologist in [facility]. I will not apply for his PA until cardiology consult and testing complete and pt is re discussed. He received his flu shot and will have his PCP fax it to [place].      Terrence Dupont--    *high  importance* Please schedule Cardiology Consult asap with Dr. Nicole Kindred. "Evaluate and make recommendations for  50% RCA lesion, heart cath done here 06/28/2015."    **His second twinrix is due 07/25/2015; can you attempt to schedule it with cardiology appt? (His local provider couldn't figure it out.)    He will RTC in February 2017 and will need:  1) 6MWT  2) spiros  3) labs: HLA, VBG, CMP, CBC, vita D.   4) Sputum--clinic collect: AFB, bacterial, and fungal.  5) RD f/u w/ Manuela Schwartz    thanks, Heather    ____________________________________________________________________________________    Date: 07/10/2015  Entry By: Annalee Genta [phone]  No, he does not need to be seen by dermatology here.  If he is relocating to [place] permanently, he should establish care with dermatology just as routine follow up due to his skin cancer, regardless of his transplant candidacy.      No, would not do induced sputum but would try for spcx at clinic visits.  Would be good to ask if any prior spcx.    Otherwise that's all.    Thanks,  Doroteo Bradford    ***************************************************  Hi Doroteo Bradford--    I found dermatology notes for [employer]. He had a Moh's procedure for BCC in 2010 ([place] [facility]) and in 2013 ([place] [facility]). He also had a SCC completely excised with clear margins in 2013. All final resection areas were free of neoplasm. He has been seeing his dermatologist at [facility] [place] annually and we have notes from  2010 to 02/2014. I can request 2016 notes and will ask him about it. Let me know if he needs follow up here as he is planning on relocating to his daughter's house in [facility] pre transplant.    Regarding sputum cultures, I looked through all of his old records and we do not have any. None in care everywhere. Sid had asked specifically for one to be done when he was back two weeks ago, he was unable to produce any sputum. I can ask him when I call him if he has submitted any cultures and where and request records. *If he is unable to expectorate a culture, would it ever be required to be induced?    I will get him scheduled for cardiology asap, let me know if I need to do anything else besides his flu shot.    Thanks, Heather    ____________________________________________________________________________________    Date: 06/28/2015  Entry By: Annalee Genta [id number]    HI Crisp wanted a sputum sample for AFB, bacterial and fungal, clinic collect-- when Aydeen returns to clinic.    Can you add to his 07/01/15 appt?    thanks, Heather  ____________________________________________________________________________________    Date: 06/27/2015  Entry By: Annalee Genta [id number]    Dorthea Cove-    I just got a call from G[place] who said Jidenna's heart cath for tomorrow has been denied and she wants him to sign a form. She tried to call him, he was in my class.    I will tell Chrishon now.    What can we do?  Heather   ____________________________________________________________________________________    Date: 06/24/2015  Entry By: Rella Larve [id number]    From: Arelia Sneddon [[email or web address]]   Sent: Tuesday, June 11, 2015 8:40 AM  To: Ricarda Frame ([email or web address])  Subject: RW -- vaccinations      HI Terrence Dupont  Jerrik did get his zostavax and pneumovax per Dr. Mickel Duhamel---    But did not do twinrix---so just [facility] #1 please now.    Thanks, Heather     ____________________________________________________________________________________    Date: 06/06/2015  Entry By: Annalee Genta [id number]    HI Emma--    Alphonso's doctor does not seem to have his vaccinations straight---let's do his pneumovax, zostavax and #1 TWINRIX here with his 11/16 to 11/21 week of appts.    Also, he said you may fax his dental clearance now, he's had cavities and fillings done.    Thanks, Heather  ____________________________________________________________________________________    Date: 05/23/2015  Entry By: Annalee Genta [phone]    Terrence Dupont    You can schedule him back---but I think he may be out of town still with his sons.    I LVM for him today to inquire about his vaccinations, pulmonary rehab and dentisthe may update you.    Thanks, Heather      ____________________________________________________________________________________    Date: 05/23/2015  Entry By: Annalee Genta [phone]    LVM for Francee Piccolo re: vaccinations, pulm rehab and dentist; I believe he may still be out of town.  ____________________________________________________________________________________    Date: 05/23/2015  Entry By: Cheron Schaumann [id number]   Hi Heather,   Recv'd dexa report summary.  States lowest T score is -2.0, osteopenia. I entered report in MAX.   It does not appear he meets criteria to be referred to endocrine, but right at the cut off at -2.0.  It appears he is not on prednisone daily.   We also recv'd a copy of his colonoscopy, but we must already have one - already entered in [facility].   It appears he is ready to be scheduled back at [facility] to complete his eval. We will need to find out if he obtained any of the requested vaccinations. If he hasn't received them, will need to plan to give here!  Bottom part of his checklist isn't filled out but looks like you already wrote out what he needs in the email to [place].   Ready for Terrence Dupont to schedule?   Thanks!   Amy         ____________________________________________________________________________________    Date: 05/03/2015  Entry By: Annalee Genta [phone]    He needs a flight plan (if he plans to stay in [place] to wait for txp) and needs to return his paperwork. Otherwise he is good to go.     ******************************************  HI Levada Dy    Fernado is super nice!    You saw him 8/25----do you need to see him with his caregivers again?    thanks! Heather  ____________________________________________________________________________________    Date: 05/03/2015  Entry By: Annalee Genta [phone]    I spoke to Francee Piccolo. He did his colonoscopy on 04/23/2015---report requested. He also had not been to a dentist for awhile---had a cleaning yesterday, and has two upcoming appts to have a crown and a few cavities taken care of. He wil call his PCP Dr. Mickel Duhamel today to follow up on dexascan and vaccines and pulmonary rehab referral.    Hi Terrence Dupont-    He is going on a hunting trip with his sons next week but I gave him the dates of 11/3 and 11/20 to take the transplant class. Levada Dy saw him, I will check with her to see if she needs to him  and caregivers again.)    So you could schedule him back to [facility] in November...    thanks!  Heather    ____________________________________________________________________________________    Date: 04/09/2015  Entry By: Annalee Genta [id number]    Terrence Dupont--  Sid wants to repeat chest CT in 3 months which is 06/2015---  It'd be best if he got it here to compare.  So, can you add that to his [facility] follow appt list?  Thanks--Heather     **************************************************************  Demetruis will get to a dentist and call us back with  name and date of appt; has not been in "awhile."    He also has a colonoscopy already scheduled for 04/23/2015 in [place].    Terrence Dupont-  Please send local checklist to  Dr. Mickel Duhamel in [place]. He will need:  1)  dexascan  2) vaccination letters printed---high dose twinrix, zostavax and pneumovax.  3) referral for pulmonary rehab    Wait until scheduling [facility]; I want him to get to dentist and get vaccines; you can check with me. He will need:  1) CEGS  2) esophagram  3) right and left heart cath---orders on chart.  4) CXR  5) EKG  6) transplant class w/ caregivers and 7) SW with caregivers  8) RD f/u appt with Manuela Schwartz  10) RTC apt with a) 6MWT  2) spiros  3) labs--end of eval + PSA/male panel and nicotine.  11) MM appt.    Thanks, Heather    ____________________________________________________________________________________    Date: 04/05/2015  Entry By: Annalee Genta [phone]  Hi Heather,     Let's do 3 months for repeat. Can he also get a sputum for AFB next time? Thanks,     Sid    ************************************************  HI Sid--    I know you are still catching up from clinic this week, I am behind too!    But Mr. Kiner echo, EF=70%; and his chest CT looked like there is less concern for malignancy...but I'm no pulmonologist.    Let me know and I will finish his checklist and call him... next week.     Thanks! Heather     ____________________________________________________________________________________    Date: 03/22/2015  Entry By: Cheron Schaumann [phone]    Recv'd results of nicotine screening 02/14/15 - negative.   ____________________________________________________________________________________    Date: 03/22/2015  Entry By: Cheron Schaumann [id number]    Frederica Kuster,   This patient is a new referral that in March was chewing nicotine 3 times per day. He states he quite, began screening through his local pulmonologist's office - we sent a letter explaining how random testing is requested.   Mr. Mangino has an upcoming clinic visit here on 8/24. He called me today to inquire if we had results of recent tests, and during our discussion he said Dr. Sherrie Sport has "open  order" set up at the lab - he just goes to submit a sample whatever day he chooses.   I explained that the testing should be random, only 24-48 hours notice of testing.   I contacted Dr. Cheryln Manly office and explained and requested they continue screening as per instructions.   We are getting nicotine screening w/ his appointment here.   If we decide to proceed with his transplant evaluation, should we plan on continuing RANDOM screening for 4 months?     Thanks,   Amy  ____________________________________________________________________________________    Date: 03/22/2015  Entry By: Cheron Schaumann [phone]    Abrahan called, asking if he needs to continue nicotine screening.  Explained we have received 2 results, from April and May.  He states he has done more.  Told him I would obtain the other results, being arranged through his pulmonologist, Dr. Rush Landmark.  I asked how these are being arranged - "Oh they have a standing order, I just go to the lab whenever I want".  I explained that the testing is supposed to be random, actually, and they are supposed to be giving him only 24 - 48 hours notice prior to the test.   Requested he keep doing nicotine screening until he is seen in Pre Lung clinic, then we can determine how to proceed with future testing.   Contacted Dr. Dittrick's office, explained how/why nicotine screening should be done randomly, referred to letter we sent in March.  Re-faxed letter at their request. Requested they continue screening - but only contact patient 24-48 hours before testing.     ____________________________________________________________________________________    Date: 01/29/2015  Entry By: Cheron Schaumann [phone]    rec'd colonoscopy report - done 06/23/2006. No MD f/u interval recommendations received.   Will fax his GI MD to see if they can give recommendations.   ____________________________________________________________________________________     Date: 01/21/2015  Entry By: Cheron Schaumann [phone]    Rec'd dermatology notes and EGD report.  ____________________________________________________________________________________    Date: 01/16/2015  Entry By: Cheron Schaumann [id number]   Abbe Amsterdam,   Mr. Leonetti was re-referred since he completed 2 months of negative nicotine screening and a chest CT was done 12/10/14 to f/u on his CT findings.    I have requested some clinical notes/test reports from [place] (Colonoscopy, EGD reports and Dermatology notes).  Although they are pending, we are ready to proceed.   Please send him to [place] for Financial Clearance.   Once he is cleared, please schedule him for the following:   1. Pre Lung Transplant Clinic - new consult visit (as per usual: w/ 35mt, work up labs - please add a nicotine/metabolite screening to the lab work).  2. Psych/social assessment with ALevada Dyand caregivers - he is recently widowed and [place]  His primary caregivers will be:   his daughter [place] Niece ([place]  [place] step son [place] in [place] may also be a back-up caregiver.  He thinks he will be able to live with his niece for the 3 months post op period.  States it is 45 minutes away. Explained we may need him closer initially, depending how far it is to drive and how well he is doing.  Will discuss this with ALevada Dywhen he is seen.   3. Nutritional assessment with SManuela Schwartz- his BMI was 20.8 when referred the first time, (141lbs), his most current weight on clinic note from April - his weight has increased to 152, so that is an improvement.   4. Echo - please schedule the afternoon of his Pre Tx clinic visit, or the following day. If his echo is concerning (previous echo EF was 45-50%) [facility]   5. Non-contrast Chest CT - recommended by EDoroteo Bradfordthat we update this here at the time of  his initial visit - to follow up on  cavitary nodule on 12/10/14 CT (done locally- we have the report and images are in PACS).   Thanks,   Amy     ____________________________________________________________________________________    Date: 01/16/2015  Entry By: Cheron Schaumann [phone]      I would go with the 3rd option.  His EF wouldn't rule him out currently so I think a follow up echo is a good start.  I don't like the idea of repeating the echo locally because if we have him see Sherren Mocha, he will want an echo by our techs to confirm.    Thanks,  Doroteo Bradford      From: Etta Grandchild [[email or web address]]   Sent: Wednesday, January 16, 2015 10:20 AM  To: Dian Situ  Cc: 'Annie Main'  Subject: RE: RW - rereferred - f/u on nodule    Erika,     Since Mr. Schrieber will be coming from [place] -  I just want to put the best plan in place for him - trying to avoid multiple trips for him    In regards to his possible low EF concern, which scenario should we schedule him for at his initial visit:    1. Update echocardiogram locally now so we can review and determine if he needs to be seen in Cardiology Heart Failure clinic at this initial transplant clinic visit. (I think Dr. Silver Huguenin is preferred - he knows what our team's concerns are and is very helpful by coming to committee).  2. Update echocardiogram at [facility] the day we see him in Clinic w/ a Cardiology Heart Failure ([facility]) appointment following - Later in day vs following day.  3. Update echo at [facility] and then determine if/when we are proceeding with an eval and if he needs to be seen by Cardiology either before completing a full w/u (if we think it might rule him out as a candidate) or, (if we don't think it will rule him out) consult w/ Cardiology at end of eval (if warranted) .    Any preference?    Thanks!  Amy    ____________________________________________________________________________________    Date: 01/16/2015  Entry By: Cheron Schaumann [phone]    We can proceed with seeing him - plan repeat CT chest no later than 64mo(maybe with clinic visit late July/early August?).  Also plan on TTE with clinic visit - if abnormal, will need to see TSherren Mocha    Thanks,  EDoroteo Bradford   ____________________________________________________________________________________    Date: 01/14/2015  Entry By: aCheron [phone]  HFrederica Kuster   This patient was previously referred - we closed his referral with the plan outlined below.   He is now re-referred - we received 2 months of negative nicotine screening and his f/u chest CT.  We have report and images.    Per his pulmonologist's note (Dr. WRenita Papa CT scan shows the nodule to be stable in size, which is good news. Report below.   His Echo in January EF 45-50% - We discussed this previously and wanted to review older echos to see what his trend has been.  Per his pulmonologist's office this is the only echo he has ever had.   Please let me know if CT is okay and we can proceed with seeing him in clinic.  Also, should I ask his local pulmonologist to repeat his echo before his visit here?  [facility]  Thanks!  Amy    ____________________________________________________________________________________    Date: 01/14/2015  Entry By: Darleene Cleaver [phone]    Amy -    The CT images/report for Mr. Jelinski are uploaded.  We just have the most recent set of images, but it looks like the plan was to have EL review them to be sure that they look ok.    Kim  ____________________________________________________________________________________    Date: 01/09/2015  Entry By: Cheron Schaumann [id number]    Abbe [place],     Mr. Loberg was previously referred and then referral was closed pending f/u chest CT and 2 months of negative nicotine screenings.  We received Chest CT report (I've requested images from [place] through ehealth) and 2 negative  nicotine screenings.     Please enter a new referral date for him (today) - we just received a local Pulm clinic f/u note as well.     In addition, his referring pulmonologist should get the letter stating we have received his re-referral    Once I have CT images I'll have Erika review them to make sure we can proceed.  If she gives the okay, I will ask you to send him to [place] for financial clearance and will come up with a plan for his initial pre lung "new" appointment.     Thanks!  Amy    ____________________________________________________________________________________    Date: 01/08/2015  Entry By: Cheron Schaumann [phone]    We received 2 months of negative nicotine screening and a chest CT report - we requested all of this prior to him getting re-referred.   Also have a note from the [place] from his pulmonologist's office,Karrie - requesting we contact her regarding his case.   Rosezena Sensor and requested she send [place] an updated clinic note - he was seen on 4/19 - she will fax it to [place]. Mordecai Maes will also look for any other echos - [facility] was unable to find any echos other than the one done 08/13/14. If she finds an older echo she will include it in her fax to [place].  I also asked her to continue random, monthly nicotine screening for a total of 6 months - through September 2016.    I have requested chest CT images for Erika to review to make sure ok for [place] to proceed w/ an initial pre lung clinic visit.     If images are ok I will call [place] to review lung transplant eval process, and request he find information about his dermatologist to obtain records.     Will need to update an echo here and may need to be seen by [place] Heart failure team in clinic (referral to Dr. Silver Huguenin).        ____________________________________________________________________________________    Date: 01/03/2015  Entry By: Darleene Cleaver [phone]     Emma,    Sorry - just reviewing more carefully and it looks  like there may be more to do.    Amy - you did the initial screening call with him, but maybe wanted to f/u?  We do have several negative nicotine test results now.      I am glad to dive into it further as well, just let me know!    Kim  ____________________________________________________________________________________    Date: 01/03/2015  Entry By: Darleene Cleaver [phone]    Emma,    We received another negative nicotine screening -  okay to schedule his initial consult appts.    Kim  ____________________________________________________________________________________    Date: 12/20/2014  Entry By: Darleene Cleaver [phone]    rec'd call from [place] with [facility].  She faxed CT report and Nicotine screening from 11/30/14 (negative).    Called Helene Kelp to see if he has had any other nicotine screening tests.  She reports that he has a "standing order."  He has just had one test done to date.    He will be due for his next test anytime.   She will call him today to come in tomorrow.      Once we receive that result, he can be scheduled as a new.    Kim  ____________________________________________________________________________________    Date: 12/13/2014  Entry By: Darleene Cleaver [phone]    Rec'd:  CT report    Fax cover sheet requests update on status of referral (from [place], [place]).    Contacted [place] office - LVM for Mordecai Maes asking if pt has been scheduled for any nicotine screening studies yet.   ____________________________________________________________________________________    Date: 10/30/2014  Type: Action  Entry By: Coy Saunas  Action on 10/30/2014 for [place], Forest Gleason [phone]    [facility] was unable to find dermatology records at [facility] in [place] without the name of his provider.   If/When Shayn is re-referred we will need the name/location of clinic of his dermatologist to review his history of skin cancer.    ____________________________________________________________________________________    Date: 10/18/2014  Entry By: Cheron Schaumann [phone]    Butch Penny -   Please keep his records.  He sounds very motivated to quit chewing tobacco and I am anticipating he may be re-referred this spring.     Thank you,   Amy  ____________________________________________________________________________________    Date: 10/18/2014  Entry By: Cheron Schaumann [phone]    I called Mr. Palau and discussed his referral for lung transplant.   He has been reading about lung transplant on line and had good questions and some insight into the process and logistics involved.   I also gave him the [employer] website and recommended he use this for further education.     As far as potential caregivers, he seems well supported.  His wife recently passed away and he lives alone in [place], [place] - however he spends a lot of time at his daughter's home in [place].  She does not work, in her 21's and her children are adults so she could be a primary caregiver and/or share primary caregiver role.  He has 2 relatives in the [place] area.   A step son in [place] and a niece in [place].  He states he could potentially stay at their homes during the 3 month recovery period and they are potential primary/back up caregivers as well.  He states none of them are smokers and they all drive.     He does have a history of skin cancer.   First seen for a spot on the left side of his face in 2008 - was biopsied in an office - and he believes it was basal cell ca.  He can't recall the name of that Dermatologist or their clinic.  Since 2010 he did have another spot on the L side of his face that is being followed regularly by a dermatologist at [facility] in [place] - I have asked John to  request records from [facility].     He endorses continued use of chewing tobacco - when I asked him when he last chewed  he stated "this  morning.  I am still chewing 3 times a day, but that is down from 6".  I stated for transplant it is required he be free from all forms of nicotine - that includes smoking/chewing/gum or any other nicotine replacement.     I reviewed chest CT/PET/Echo findings with Doroteo Bradford. He will need the planned f/u regarding his most recent chest CT/PET findings - they are planning repeat studies in 3 months (end of April/beginning of May) .  Also, his echo is concerning for mild global hypokinesis - I will ask John to obtain all echos over the past 4 years to see if he has had any sort of trend in his cardiac function.     Butch Penny - Please close his referral.      I will fax a letter requesting Dr. Mickel Duhamel re-refer him once he has completed at least 2 months of negative nicotine screening and he has completed further evaluation and follow up regarding his chest CT/PET findings.      Thank you, Amy    ____________________________________________________________________________________    Date: 10/18/2014  Entry By: Cheron Schaumann [phone]      Sounds good.    Thanks,  Doroteo Bradford    On Oct 18, 2014, at 1:21 PM, Amy Luana Shu     Should we close the referral and request re-referral after nicotine screening and f/u chest CT w/ set plan regarding nodule/cavitary lesion is in place?    Thanks,  Amy    From: Dian Situ   Sent: Thursday, October 18, 2014 1:06 PM  To: Amy Luana Shu  Cc: Annie Main  Subject: RE: RW - new COPD referral      Let's re-evaluate at that time.  We can get any other old echos and look at the clinical context.    Thanks,  Doroteo Bradford    From: Etta Grandchild   Sent: Thursday, October 18, 2014 12:48 PM  To: Dian Situ  Cc: Annie Main  Subject: RE: Gilda Crease - new COPD referral    How about the Echo -?  If/when we see him (depending on outcome of f/u chest CT) - would we want him to be seen in [facility] heart failure clinic at the time of his initial pre tx visit for evaluation of his reduced LV function?    Thanks!  Amy     ____________________________________________________________________________________    Date: 10/18/2014  Entry By: Cheron Schaumann [id number]    Frederica Kuster,   This patient is a new referral - COPD - sent on to [place] from [facility] (not a candidate due to diffuse disease).  Reviewing clinical data I found a few medical concerns:  1. Chest CT: LUL mass-like consolidation, 1.4 x 2.3 x 3 cm, LLL calcified granuloma inferiorly 9 x 7 cm.  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 See report conclusions at the end of this email.  The CT images are here.   2.  PET - they did do a PET to f/u on the CT - we have report but no images.  I can certainly request these. Done 09/11/14, I'll put that report below as well.   3. Echo 08/13/14 - EF 45-50% with LV mild global hypokinesis, mildy reduced systolic function, also notes a  dilated aortic root, ~ 4cm - but states difficult to visualize the ascending aorta.    He had previously quit smoking, but started chewing tobacco when his wife recently died in 2022-08-28.  Diane recommended he stop chewing immediately and requested his local provider begin nicotine screening.      I have not called him yet - but am planning to call him today.  I'll find out if he has completely stopped all nicotine, review his medical hx, provide info about transplant eval process and discuss his caregiver/support.  Will also let him know we are continuing to have our team review his records in case you have any further recommendations.    Thanks!  Amy    ____________________________________________________________________________________    Date: 09/27/2014  Entry By: Darleene Cleaver [phone]    RE: KORD MONETTE - [id number]    This is a 69 year old w/COPD referred by Dr. Mickel Duhamel in [place] for [place]. Pre-BD fev1 is 26% w/DLCO 28% and modest LVs, but with diffuse disease, so no LVRS.     I spoke to him for the first time  today. Clinic notes indicated he had recently moved his wife to a SNF, but he reports that she died last month. He quit smoking in 2005 when he was diagnosed, but he did use chew for some time, and he reports that he resumed using it after his wife died. I told him to throw it away and he says he will do it immediately. He identifies his daughter, Earley Brooke, who lives in [place], as his probable caregiver, but was not aware of the full implications of living in [place] and 24/7 care.     I have not mailed him your info yet.     CT is in PACS and I moved the referral to your queue. Will put the records in Donna's box later today.     *******************************************    Sheria Lang, RN, BSN - Coordinator & Case Manager

## 2019-11-04 NOTE — Historical Transplant Studies (Signed)
Kasi, Dain [id number])  Lung Post-Transplant Studies  Referral Date: 01/03/2015  Transplant Date: 09/05/2015  ____________________________________________________________________________________    Transplant Type: Bilateral Cadaveric    Procurement Surgeons: Roderick Pee    Procurement Nurse/Techs: Sharlet Salina    Procurement Type: [id number] Lung Surgical    Procurement Date: 09/05/2015    [place] Time: [id number] PST

## 2019-11-04 NOTE — Historical Transplant Studies (Signed)
Boyd, [place] ([id number])  Lung Pre-Transplant Studies  Referral Date: 01/24/15  Status: Died  ____________________________________________________________________________________    Transplant History: Lung on 09/05/2015    Primary Diagnosis: CHRONIC OBSTRUCTIVE PULMONARY DISEASE/EMPHYSEMA ICD 10 code: J43.9  ABO: A     Social History: Mr. Cartlidge was born and raised [place]. He lived and worked in [place] for a period of time before returning to [place]. He currently lives in his own home in [place], [place], a small community 40 miles from [place].  The patient is a widower. He has four adult children. Tonya lives in [place], [place].  He has a son in [place] and another son in [place].  He did not provide the name of his other son who is currently in prison. The patient has two adult grandchildren.   Mr. Legrand is prescribed Effexor for anxiety. He is not enrolled in outpatient mental health therapy and has never been hospitalized for psychiatric reasons.  Mr. Kenley smoked 1.5 PPD for 25 years. He stopped smoking in 2005 then began chewing tobacco. He stopped in April of this year.  He denies current use of other nicotine products such as chewing tobacco or Nicorette gum.  The patient rarely consumes alcohol. He has never used recreational drugs.     Family History:     Education/Employment History:       Previous Blood Transfusions:     History of Present Illness: Mr. Widmayer is a 69 year old male with a history of COPD (emphysema-type), GERD, HTN, Anxiety and recently diagnosed lung nodule who presents for evaluation for lung transplant.    Patient has had a relatively indolent course after being diagnosed with COPD in 2005.  At that time, he was placed on supplemental oxygen and quit smoking, however he started chewing tobacco.  He states that he remained quite active despite the need for supplemental oxygen, and was able to mow his lawn and hunt, which he does with his son and daughter.   Over the last 1-2 years, his functional capacity has diminished, where he is quite limited with his hunting, however he still does all his ADLs, albeit slower than before. With regards to his COPD, he notes one hospitalization roughly 8 years prior (2008-2009), however he has had no hospitalizations since then.  He does have a standing prescription for prednisone when he has flares, which he states he has had to use roughly 3 times per year.    He was noted on 08/13/14 to have a new LUL nodule that was concering for infection vs. Malignancy.  He subsequently had a PET-CT scan which showed FDG avidity in the LUL, and a repeat CT was recommended for interval follow up.  His interval CT in may again showed the LUL lesion with perhaps some thickening and possible air fluid level.    Past Medical History: 1. COPD-with centrilobular emphysema  2. Skin Cancer  3. GERD  4. HTN  5. Anxiety  6. Lung Nodule    Prior Lung Surgery:     Prior Cardiac Surgery:     Medication History: Current outpatient prescriptions:   *  Albuterol Sulfate (PROAIR HFA IN), Inhale 1 puff by mouth every 6 hours as needed.  *  Budesonide-Formoterol Fumarate 160-4.5 MCG/ACT Inhalation Aerosol, Inhale 2 puffs by mouth 2 times a day.  *  Ibuprofen 200 MG Oral Cap, Take 200 mg by mouth daily as needed.  *  Lisinopril-Hydrochlorothiazide 20-12.5 MG Oral Tab, Take 1 tablet by  mouth daily.  *  Tiotropium Bromide Monohydrate 1.25 MCG/ACT Inhalation Aero Soln, Inhale 1 puff by mouth 2 times a day.  *  TraMADol HCl 50 MG Oral Tab, Take 100 mg by mouth 2 times a day as needed.  *  TraZODone HCl 50 MG Oral Tab, Take 100 mg by mouth at bedtime.  *  Venlafaxine HCl 100 MG Oral Tab, Take 1 tablet by mouth 2 times a day.       Post-op Antibiotic Regiment:     Alcohol History:     Tobacco History: Smoked? Yes  Chewed? Yes    Recreational Drug History:     Functional Status ADL:   ____________________________________________________________________________________     Study: Left Heart Cath  Date: 06/28/2015  L Main: Normal  LAD: Normal  Circ: Normal  RCA: Occlusion/narrowing 50%  Findings: Coronary angiography:   Left main- short, without significant disease    LAD- angiographically without significant disease    LCX- angiographically without significant disease    RCA- large, dominant vessel. Distal focal 50% stenosis.     IMPRESSIONS:    1. Normal to mildly elevated right heart filling pressures.    2. Normal left heart filling pressures.    3. Normal cardiac index.    4. No significant coronary artery disease   ____________________________________________________________________________________    Study: Right Heart Cath  Date: 06/28/2015  PA(sys): 29 mm/Hg  PA(dia): 19 mm/Hg  PA(mean): 23 mm/Hg  PCW(mean): 9 mm/Hg  CO: 4.55 L/min  CI: 2.56 L/min/m2  RA: 2 mm/Hg  ____________________________________________________________________________________    Study: CT Non-Contrast Chest  Date: 06/25/2015  FINDINGS:  CHEST  Supraclavicular region: No lymphadenopathy.  Mediastinum: No lymphadenopathy.  Lungs: Unchanged left upper lobe nodule with fluid-filled central cavitation measuring 1.3 x 1.3 cm, previously 1.3 x 1.3 cm (remeasured, 3/26). No new pulmonary nodules identified.  Redemonstration of severe diffuse upper lobe predominant centrilobular emphysema.  Unchanged linear atelectasis in the lingula.  Pleura: Persistent left pleural thickening.   Heart: Coronary artery calcification involving the RCA.  Miscellaneous findings: Mild calcification of the abdominal aorta indicating atherosclerosis. Bilateral Bochdalek hernias containing retroperitoneal fat.  Bony structures: No lytic or blastic bone lesions are detected. Mild wedging of L1 vertebral body.  Upper abdomen: Unchanged left adrenal adenoma. Limited noncontrast view of the upper abdomen is otherwise unremarkable. Punctate nonobstructive calcification in the left kidney is unchanged.  IMPRESSION:  Unchanged left upper lobe  fluid-filled cavitary nodule compared to April 03, 2015. As before, infectious process is most likely.  Unchanged severe diffuse centrilobular emphysema with bibasilar atelectasis.  Unchanged left pleural thickening.    Comments: due in  6 months per EL, due 12/2015.  ____________________________________________________________________________________    Study: CT Non-Contrast Chest  Date: 04/03/2015  FINDINGS: CHEST:  Lungs: Redemonstrated is the left upper lobe nodule with a central cavitation and associated scarring measuring 2.4 x 2.1 cm (3/26), previously 2.1 x 2.4 cm (3/47). Compared to the exam dated August 13, 2014, the cavitation was newly noted in the May 2016 study. Unchanged linear atelectasis in the lingula.  No new pulmonary nodules identified.  Background lung demonstrates severe diffuse emphysema most predominant in the upper lobes.  Heart: Coronary artery calcifications involving the RCA.  Bones: No acute osseous abnormalities identified. Mild wedging of the L1 vertebral body.  IMPRESSION:  1. Stable left upper lobe 2.4 x 2.1 cm nodule. Of note, the cavitary component was newly noted on Dec 10, 2014. Given the stability of the lesion over  the past 7 months, an infectious etiology is most likely. Less concerning for malignancy.    Comments: per S.K.--repeat in 3 months, due 06/2015  ____________________________________________________________________________________    Study: CT Non-Contrast Chest  Date: 12/10/2014  CONCLUSION:  New air-fluid level in the cavitary left lower lobe nodule, which remains otherwise unchanged in size.  This region was opacified on 08/13/2014 and may be waxing and waning infection of a pulmonary cyst or bronchiectasis/cavity.  No increase in pleural/parenchymal thickening more superiorly.  Continued follow-up may be needed to ensure benignity. Consider 3-6 month CT.   ____________________________________________________________________________________    Study: CXR  Date:  06/26/2015  R= 25.2  L= 27.7  A= 26.1  D= 29  ____________________________________________________________________________________    StudyVicente Serene  Date: 06/28/2015  [place].  ____________________________________________________________________________________    StudyDarrol Jump Cardiology Consult  Date: 07/29/2015  Assessment/Plan    (Z01.818) Encounter for pre-transplant evaluation for lung transplant  (primary encounter diagnosis)  I concur that there are no contraindications for lung transplant for a cardiac point of view.  I recommend that his atorvastatin be raise to 80 mg from 40 mg in the pretransplant perio.     (I10) Essential hypertension - not an issue at the present time.    RTC in 6 months.   ____________________________________________________________________________________    Study: ECHO  Date: 04/04/2015  CONCLUSIONS:  Refer to above report for a more detailed description of findings. The underlying rythm is sinus.  1. Normal left ventricular size and function, LVEF 70%. No regional changes seen.  2. Normal LV diastolic function.  3. Systolic flattening of the mitral leaflets without prolapse or regurgitation. Otherwise normal valve anatomy and  function.  4. Normal right ventricular size and function.  5. [place] pulmonary hypertension (PASP 29-34 mmHg, TAPSE 2.7 m/s) with normal RA pressure.  6. Mildly dilated aorta at the sinuses (max 4.0 cm).  7. No pericardial effusion.  No prior studies for comparison.  ____________________________________________________________________________________    Study: EKG  Date: 06/26/2015  NORMAL SINUS RHYTHM  WITHIN NORMAL LIMITS  NO PREVIOUS ECGS AVAILABLE  ____________________________________________________________________________________    Study: Esophagram  Date: 07/02/2015  FINDINGS:  Pharyngeal swallowing function is normal. There are no anatomic abnormalities. There is no aspiration.    Minimal esophageal dysmotility, as evidenced by few tertiary  contractions. There is no hiatal hernia. Gastroesophageal reflux was not demonstrated. There are no manifestation of esophagitis or other anatomic abnormality.    IMPRESSION:  1. Normal pharynx. No aspiration.  2. Minimal esophageal dysmotility.  3. No hiatal hernia. No gastroesophageal reflux.  ____________________________________________________________________________________    Study: 24hpHMonitor  Date: 06/26/2015  pH FINDINGS:  Luiz Ochoa score: 7.0  Symptom Correlation:  None  Fellow's Initial Impression   Normal pH study    Harolyn Rutherford, MD, Attending Attestation:  I personally reviewed and interpreted this 24-hour dual probe pH monitoring report.  Diagnosis: Normal pH study.  *******************************************************  MANOMETRY FINDINGS:  Initial Impression   1.The lower esophageal sphincter is located at 48 cm. The resting pressure is low and sphincter relaxation is normal.  2.There is not evidence of a dual high-pressure zone  3.The esophageal body is characterized by 100 percent normal peristalsis  4.The upper esophageal sphincter is located at 20.8 cm. The resting pressure is normal.  5.Additional manometric findings according to the [place] include:  a.Integral Relaxation Pressure: 1.5 mm Hg  b. Distal Contractile Integral: 2797 mm Hg/cm/s  c. Distal Latency: 6.4 sec    Final Impression  Hypotensive  lower esophageal sphincter, normal peristalsis     Harolyn Rutherford, MD: Attending Attestation:  I personally reviewed and interpreted this high-resolution esophageal manometry study.  Diagnosis: Hypotensive lower esophageal sphincter, otherwise normal esophageal manometry.   ____________________________________________________________________________________    Study: EGD  Date: 12/05/2014  ESOPHAGUS:  The esophagus showed:  a non-obstructing benign-appearing distal esophageal stricture consistent with a Schatzki's ring.  No biopsies were taken.  Z-line location: 41cm from the  incisor teeth.   STOMACH:  A small hiatal hernia was seen without evidence of Cameron erosions on retroflexion viewing.  The stomach was otherwise normal without evidence of gastritis, ulceration, or neoplasia.    DUODENUM:  The duodenum was normal without evidence of ulceration, inflammation, or sprue.   Dilation:  A 50 French Maloney dilator was passed with mild resistance of the dilator and no heme.    IMPRESSIONS:  Esophageal stricture (benign Shatzkis's ring).  PLAN:  1. Soft diet next meal, then advance as tolerated.   ____________________________________________________________________________________    Study: Colonoscopy  Date: 04/23/2015  Findings:  Digital rectal exam revealed no masses. A number (2) of benign appearing diminutive distal transverse colon polyps were seen and completely removed with cold forceps. A flat mucous-capped 65m proximal transverse colon  polyp was seen and completely removed with a hot snare. retroflexion in the right colon and cecum was performed revealing no significant abnormalities.  Pathology: Final diagnosis: A. Polyp x 2 transverse colon, biopsies: Hyperplastic polyp. Fragment of colonic mucosa with hyperplastic changes. B. Polyp, transverse colon, biopsy: Tubular adenoma.  Plan: Repeat colonoscopy in 3 years.    Comments: Repeat in 3 years, 04/2018.  ____________________________________________________________________________________    Study: 24hrUrine  Date: 04/04/2015  eGFR>60, no proteinuria.    Comments: UA in 12 mos, due 03/2016.  ____________________________________________________________________________________    Study: Renal Screening  Date:   eGFR (EA): 45 (10/28/2016 9:01:00 AM)  eGFR (AA): 55 (10/28/2016 9:01:00 AM)  Urine Protein: Negative (09/14/2015 1:15:00 AM)    Comments: UA due 03/2016  ____________________________________________________________________________________    Study:Natale Lay Date: 05/14/2015  Impression:  Bone mineral density of the lumbar  spine is falsely elevated due to the degenerative changes and sclerosis in the spine.   The lowest T-score value is - 2.0 measured at the left femoral neck.  This patient is considered osteopenic according to the [place]   ____________________________________________________________________________________    Study: NicotineMetabolites  Date:   07/01/15: Negative  04/04/15: Negative.  02/14/15: Negative   12/21/14 : Negative  11/30/14: Negative    Comments: 5 negative screenings since 04/16.  ____________________________________________________________________________________    Study: PSA  Date: 07/01/2015  0.65  ____________________________________________________________________________________    Study: Dental  Date: 05/01/2015  Dental condition GOOD. Teeth and gums are FREE of infection. Major restorative treatment plan: One crown and one filling scheduled 06/20/2015.     Comments: SBurt EkDDS   [address] [place] [id number]   [phone] ____________________________________________________________________________________    Study:Valma Cava Date: 04/04/2015  M.tuberculosis infection NOT likely The following criteria must be met for the Quantiferon-TB Gold assay to be interpreted as positive: Negative (nil) control stimulus must be <= 8.0 IU gamma IF (interferon)/ml AND TB antigen value minus negative (nil) control stimulus value must be => 0.35 IU gamma IF (interferon)/ml AND TB antigen value minus negative (nil) control stimulus value must be => 25% of the negative (nil) control stimulus value.  ____________________________________________________________________________________    Study: HepABVaccination  Date:   #1 Garnet Koyanagi given [facility] 06/25/15  #2 TWINRIX given [place] [facility] 07/29/2015  #3 due 12/23/2015    Comments: serologies due June 2017.

## 2020-09-20 IMAGING — CT CT THORAX WO CONTRAST
3 of 5 series · 14 of 36 positions shown, 15 images · non-contrast
Comparison: Chest x-ray study 05/16/2018 and CT chest study 08/13/2017.

HISTORY: Nodule
TECHNIQUE: Axial images of the chest were obtained from the lung apices through the lung bases without intravenous contrast. Coronal and sagittal reformation images obtained.

[Series 3: soft tissue 1mm · axial · 0.72mm/px · z∈[-372,-96]mm · 8 of 688 slices shown]
[im 69/688  mediastinal]
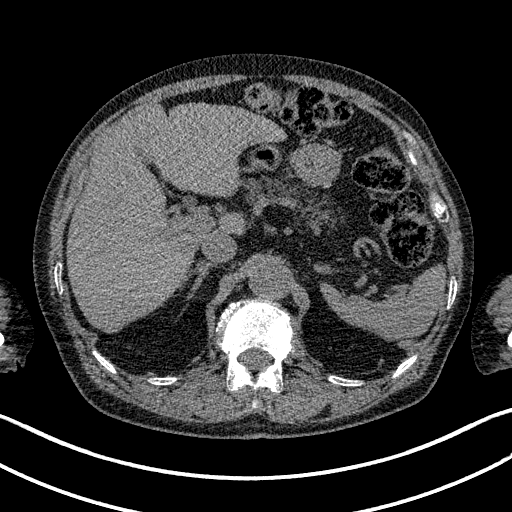
[im 138/688  mediastinal]
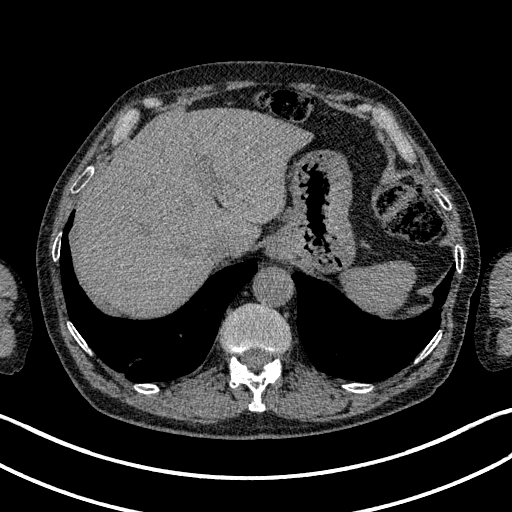
[im 207/688  mediastinal]
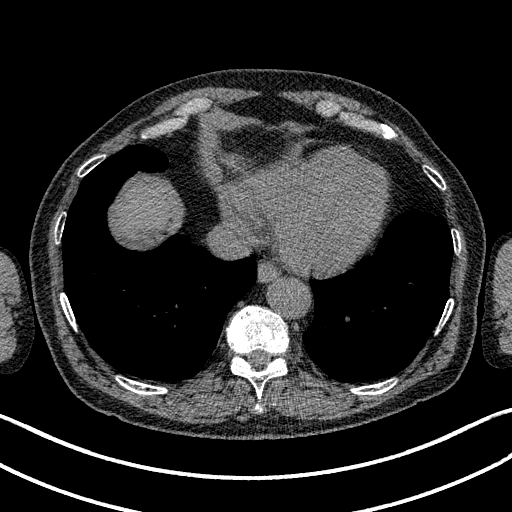
[im 310/688  mediastinal]
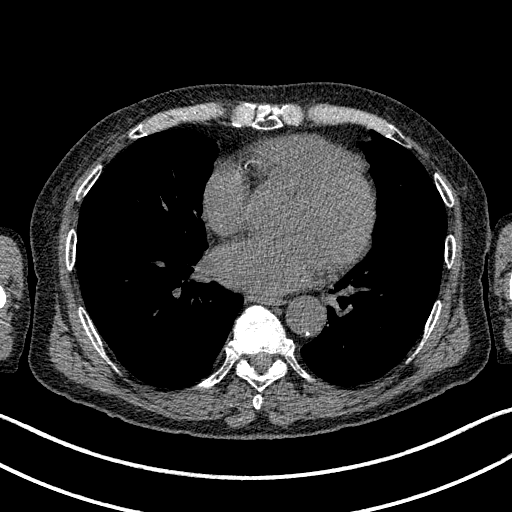
[im 378/688  mediastinal]
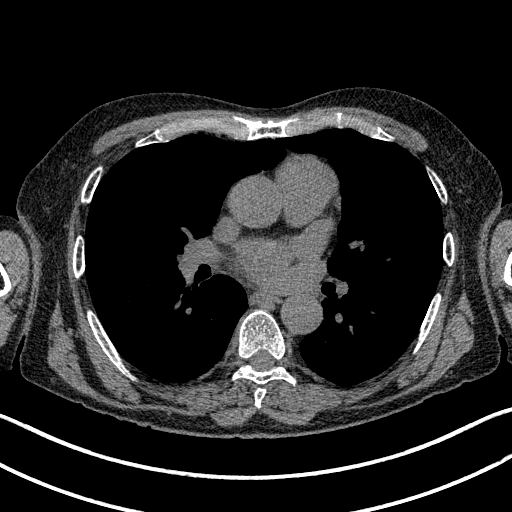
[im 481/688  mediastinal]
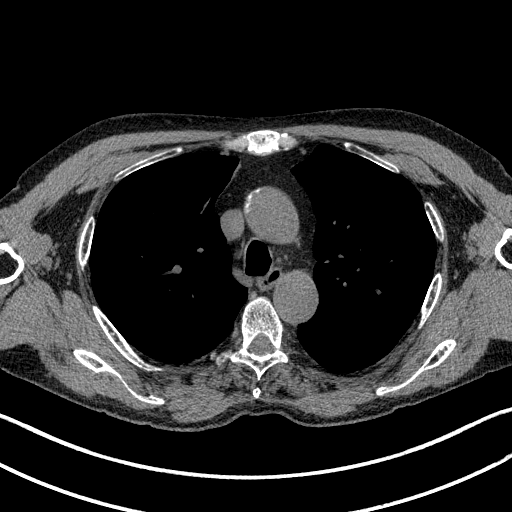
[im 550/688  mediastinal]
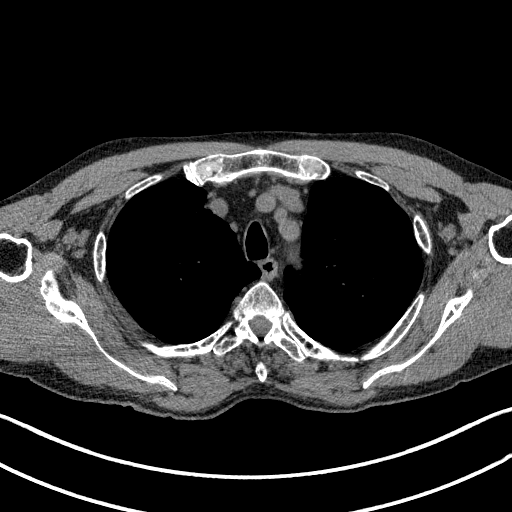
[im 619/688  mediastinal]
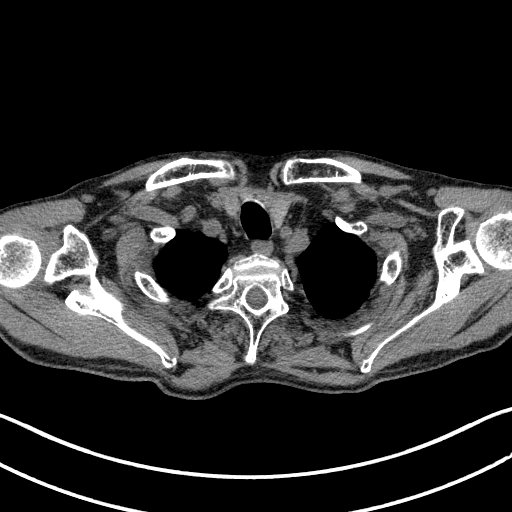

[Series 4: lung · axial · 0.72mm/px · z∈[-298,-142]mm · 3 of 158 slices shown, 4 images]
[im 40/158  mediastinal]
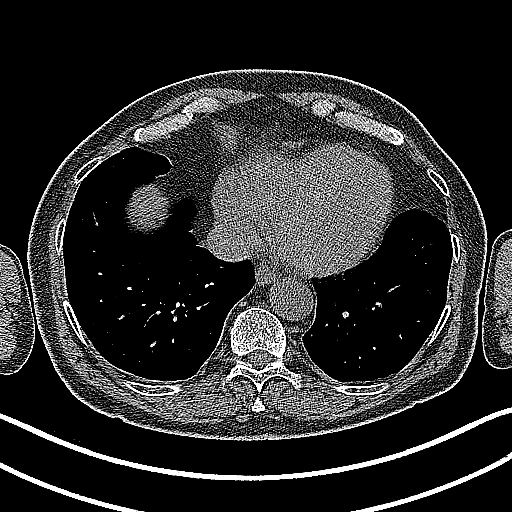
[im 40/158  lung]
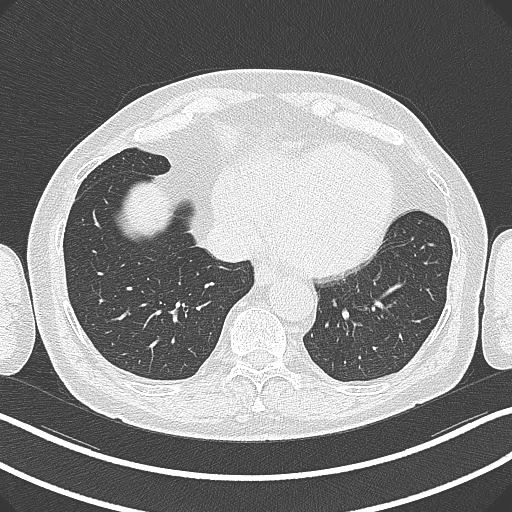
[im 79/158  lung]
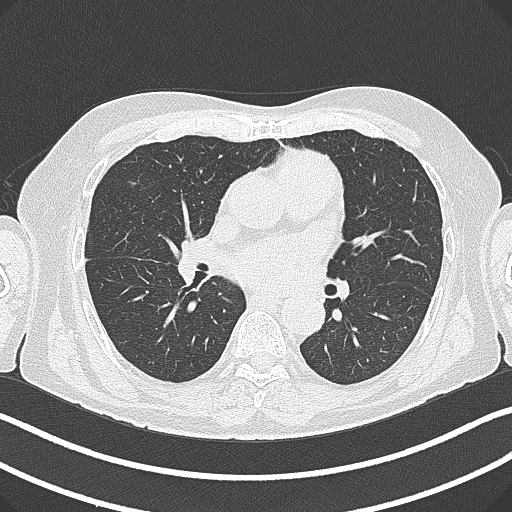
[im 118/158  lung]
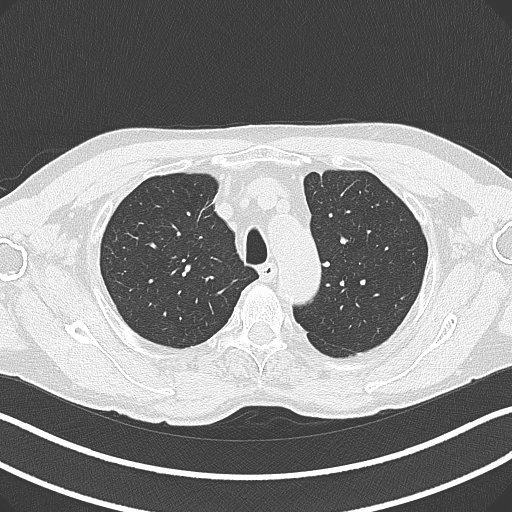

[Series 5: coronal · coronal · 0.69mm/px · 3 of 57 slices shown]
[im 12/57  lung]
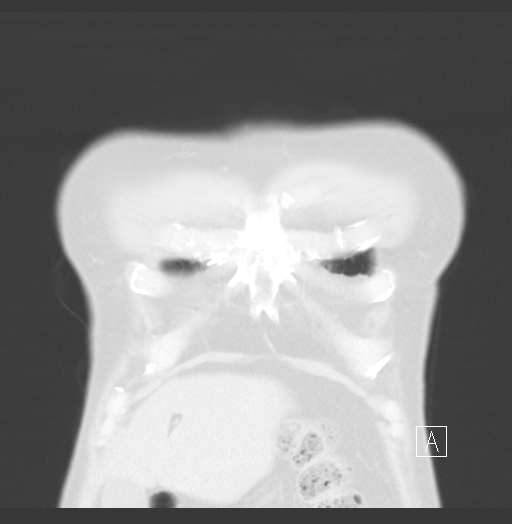
[im 23/57  lung]
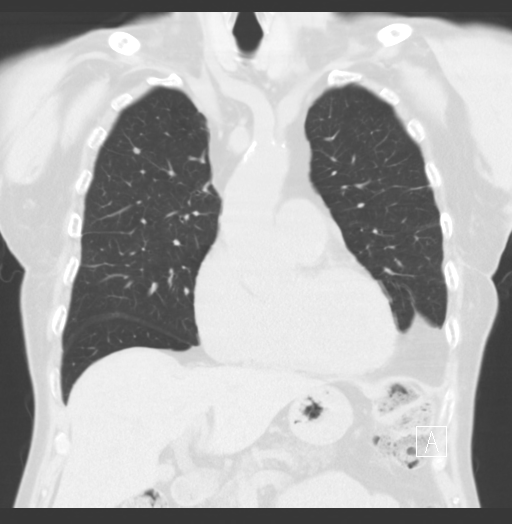
[im 34/57  lung]
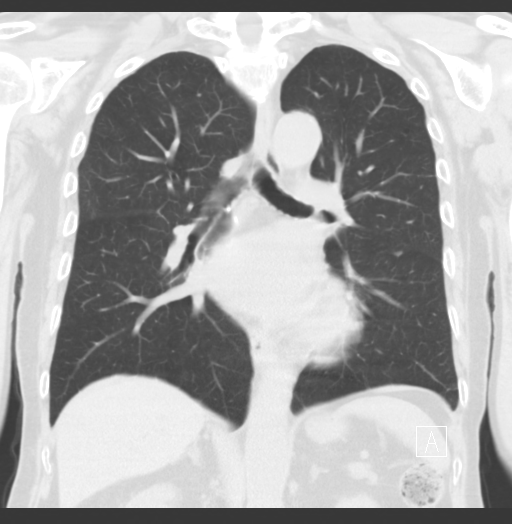

[14 of 36 positions shown; findings below may reference images not displayed]

FINDINGS: Small amount of spiculated scar of right upper lobe identified seen on images 37 series 4. Additional minimal spiculated scars of lung apices also identified. Nodular scar of lung apices of 2 mm seen on image 27 series 4. 4 mm noncalcified pulmonary nodule of left upper lobe seen on image 34 series 4. Small 3 mm nodule of left upper lobe seen images 32 series 4. Smaller nodule left apex of 2 mm also identified. Subsegmental atelectasis involving middle lobe and lingular division of left upper lobe seen. 2 to 4 mm pulmonary nodules of left lung base also identified best seen image 142 series 4. Right-sided Bochdalek type diaphragmatic fatty hernia up to 3.7 cm seen. Trachea and main bronchi are normal.

Heart size is slightly enlarged. There is no pericardial effusion. Calcified plaques of coronary arteries identified. Calcified plaques of aorta seen without aneurysm. Main pulmonary arteries are normal.

Thyroid gland and esophagus are normal. No enlarged mediastinal or axillary lymph nodes seen. Partially calcified lymph node right-sided subcarinal region identified. Vascular calcifications of abdominal aorta and its branches seen. Fusiform aneurysm of celiac trunk up to 2.1 cm in transverse diameter identified as seen on previous study. Upper abdominal visceral organs otherwise normal. Endplate spurs of spine seen.

Coronal and sagittal reformation images confirm above findings.
IMPRESSION: Likely interval resolution pulmonary nodule of 6 mm right apex region seen on prior chest x-rays exam.

Additional noncalcified pulmonary nodules identified bilaterally, up to 4 mm in diameter, likely due to infectious versus inflammatory etiology. CT chest noncontrast exam after 6 months interval is advised.

Additional chronic findings of chest and upper abdomen as noted.

No other change.

Total radiation dose to patient is CTDIvol 13.03 mGy and DLP 468.29 mGy-cm.

## 2023-05-04 LAB — PATHOLOGY, SURGICAL
